# Patient Record
Sex: Female | Born: 1960 | Race: White | Hispanic: No | Marital: Married | Smoking: Former smoker
Health system: Southern US, Community
[De-identification: ages and names within clinical notes are randomized; demographics above are authoritative.]

## PROBLEM LIST (undated history)

## (undated) DIAGNOSIS — K219 Gastro-esophageal reflux disease without esophagitis: Secondary | ICD-10-CM

## (undated) DIAGNOSIS — M1990 Unspecified osteoarthritis, unspecified site: Secondary | ICD-10-CM

## (undated) DIAGNOSIS — T7840XA Allergy, unspecified, initial encounter: Secondary | ICD-10-CM

## (undated) DIAGNOSIS — J45909 Unspecified asthma, uncomplicated: Secondary | ICD-10-CM

## (undated) DIAGNOSIS — D649 Anemia, unspecified: Secondary | ICD-10-CM

## (undated) DIAGNOSIS — F17211 Nicotine dependence, cigarettes, in remission: Secondary | ICD-10-CM

## (undated) DIAGNOSIS — N952 Postmenopausal atrophic vaginitis: Secondary | ICD-10-CM

## (undated) DIAGNOSIS — Z6839 Body mass index (BMI) 39.0-39.9, adult: Secondary | ICD-10-CM

## (undated) DIAGNOSIS — E785 Hyperlipidemia, unspecified: Principal | ICD-10-CM

## (undated) DIAGNOSIS — R7303 Prediabetes: Secondary | ICD-10-CM

## (undated) DIAGNOSIS — Z6841 Body Mass Index (BMI) 40.0 and over, adult: Principal | ICD-10-CM

## (undated) DIAGNOSIS — E66812 Obesity, class 2: Secondary | ICD-10-CM

## (undated) DIAGNOSIS — R053 Chronic cough: Secondary | ICD-10-CM

## (undated) HISTORY — PX: POLYPECTOMY: SHX149

## (undated) HISTORY — DX: Allergy, unspecified, initial encounter: T78.40XA

## (undated) HISTORY — DX: Unspecified asthma, uncomplicated: J45.909

## (undated) HISTORY — DX: Unspecified osteoarthritis, unspecified site: M19.90

## (undated) HISTORY — DX: Anemia, unspecified: D64.9

## (undated) HISTORY — DX: Gastro-esophageal reflux disease without esophagitis: K21.9

---
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
## 1983-10-06 HISTORY — PX: TUBAL LIGATION: SHX77

## 2001-06-19 ENCOUNTER — Encounter: Payer: Self-pay | Admitting: Emergency Medicine

## 2001-06-19 ENCOUNTER — Emergency Department (HOSPITAL_COMMUNITY): Admission: EM | Admit: 2001-06-19 | Discharge: 2001-06-19 | Payer: Self-pay | Admitting: Emergency Medicine

## 2002-08-03 ENCOUNTER — Encounter: Admission: RE | Admit: 2002-08-03 | Discharge: 2002-08-03 | Payer: Self-pay | Admitting: Family Medicine

## 2002-08-03 ENCOUNTER — Encounter: Payer: Self-pay | Admitting: Family Medicine

## 2003-11-02 ENCOUNTER — Ambulatory Visit (HOSPITAL_COMMUNITY): Admission: RE | Admit: 2003-11-02 | Discharge: 2003-11-02 | Payer: Self-pay | Admitting: Family Medicine

## 2009-10-05 HISTORY — PX: LIPOMA EXCISION: SHX5283

## 2013-01-14 ENCOUNTER — Ambulatory Visit (INDEPENDENT_AMBULATORY_CARE_PROVIDER_SITE_OTHER): Payer: BC Managed Care – PPO | Admitting: Family Medicine

## 2013-01-14 VITALS — BP 125/76 | HR 77 | Temp 98.5°F | Resp 16 | Ht 66.0 in | Wt 199.0 lb

## 2013-01-14 DIAGNOSIS — J45901 Unspecified asthma with (acute) exacerbation: Secondary | ICD-10-CM

## 2013-01-14 MED ORDER — ALBUTEROL SULFATE HFA 108 (90 BASE) MCG/ACT IN AERS: 2.0000 | INHALATION_SPRAY | Freq: Four times a day (QID) | RESPIRATORY_TRACT | Status: DC | PRN

## 2013-01-14 MED ORDER — MONTELUKAST SODIUM 10 MG PO TABS: 10.0000 mg | ORAL_TABLET | Freq: Every day | ORAL | Status: DC

## 2013-01-14 MED ORDER — BUDESONIDE-FORMOTEROL FUMARATE 160-4.5 MCG/ACT IN AERO: 2.0000 | INHALATION_SPRAY | Freq: Two times a day (BID) | RESPIRATORY_TRACT | Status: DC

## 2013-01-14 NOTE — Progress Notes (Signed)
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  She is here to get her asthma meds refilled.  She is almost out and cannot get appt with PCP.  Objective:  NAD HEENT unremarkable Chest:  Good BS, very few wheezes Heart: reg, no murmur Skin:  Warm and dry.

## 2013-01-14 NOTE — Patient Instructions (Signed)

## 2013-04-20 ENCOUNTER — Ambulatory Visit (INDEPENDENT_AMBULATORY_CARE_PROVIDER_SITE_OTHER): Payer: BC Managed Care – PPO | Admitting: Emergency Medicine

## 2013-04-20 VITALS — BP 124/82 | HR 80 | Temp 98.1°F | Resp 16 | Ht 66.0 in | Wt 200.0 lb

## 2013-04-20 DIAGNOSIS — Z0000 Encounter for general adult medical examination without abnormal findings: Secondary | ICD-10-CM

## 2013-04-20 LAB — LIPID PANEL
HDL: 59 mg/dL (ref >39)
Total CHOL/HDL Ratio: 3.5 Ratio
VLDL: 29 mg/dL (ref 0–40)

## 2013-04-20 LAB — COMPREHENSIVE METABOLIC PANEL
ALT: 17 U/L (ref 0–35)
AST: 14 U/L (ref 0–37)
BUN: 10 mg/dL (ref 6–23)
Creat: 0.76 mg/dL (ref 0.50–1.10)
Total Bilirubin: 0.6 mg/dL (ref 0.3–1.2)

## 2013-04-20 NOTE — Progress Notes (Signed)
[facility] [address], [place] [address] [phone]  Date:  04/20/2013   Name:  Tricia Bowen   DOB:  [birth year]   MRN:  [id number]  PCP:  No PCP Per Patient    Chief Complaint: paperwork and bloodwork   History of Present Illness:  Tricia Bowen is a 52 y.o. very pleasant female patient who presents with the following:  Comes for wellness examination.  meds for asthma and allergies.  Denies other complaint or health concern today.   There are no active problems to display for this patient.   Past Medical History  Diagnosis Date  . Allergy   . Anemia   . Asthma     Past Surgical History  Procedure Laterality Date  . Tubal ligation      History  Substance Use Topics  . Smoking status: Former Smoker    Types: Cigarettes    Quit date: 06/05/2008  . Smokeless tobacco: Not on file  . Alcohol Use: Yes     Comment: 7/wk    Family History  Problem Relation Age of Onset  . Alzheimer's disease Mother   . Heart disease Father   . Heart disease Maternal Grandmother   . Heart disease Maternal Grandfather   . Alzheimer's disease Paternal Grandmother   . Heart disease Paternal Grandfather     Allergies  Allergen Reactions  . Penicillins     Medication list has been reviewed and updated.  Current Outpatient Prescriptions on File Prior to Visit  Medication Sig Dispense Refill  . albuterol (PROVENTIL HFA;VENTOLIN HFA) 108 (90 BASE) MCG/ACT inhaler Inhale 2 puffs into the lungs every 6 (six) hours as needed for wheezing.  6.7 g  11  . budesonide-formoterol (SYMBICORT) 160-4.5 MCG/ACT inhaler Inhale 2 puffs into the lungs 2 (two) times daily.  1 Inhaler  11  . fexofenadine (ALLEGRA) 180 MG tablet Take 180 mg by mouth daily.      . montelukast (SINGULAIR) 10 MG tablet Take 1 tablet (10 mg total) by mouth at bedtime.  90 tablet  3   No current facility-administered medications on file prior to visit.    Review of Systems:  As per HPI,  otherwise negative.    Physical Examination: Filed Vitals:   04/20/13 0818  BP: 124/82  Pulse: 80  Temp: 98.1 F (36.7 C)  Resp: 16   Filed Vitals:   04/20/13 0818  Height: 5\' 6"  (1.676 m)  Weight: 200 lb (90.719 kg)   Body mass index is 32.3 kg/(m^2). Ideal Body Weight: Weight in (lb) to have BMI = 25: 154.6  GEN: WDWN, NAD, Non-toxic, A & O x 3 HEENT: Atraumatic, Normocephalic. Neck supple. No masses, No LAD. Ears and Nose: No external deformity. CV: RRR, No M/G/R. No JVD. No thrill. No extra heart sounds. PULM: CTA B, no wheezes, crackles, rhonchi. No retractions. No resp. distress. No accessory muscle use. ABD: S, NT, ND, +BS. No rebound. No HSM. EXTR: No c/c/e NEURO Normal gait.  PSYCH: Normally interactive. Conversant. Not depressed or anxious appearing.  Calm demeanor.     Assessment and Plan: Wellness examination Labs pending.   Signed,  Phillips Odor, MD

## 2013-07-15 ENCOUNTER — Ambulatory Visit: Payer: BC Managed Care – PPO

## 2013-07-15 ENCOUNTER — Ambulatory Visit (INDEPENDENT_AMBULATORY_CARE_PROVIDER_SITE_OTHER): Payer: BC Managed Care – PPO | Admitting: Family Medicine

## 2013-07-15 VITALS — BP 124/72 | HR 112 | Temp 101.3°F | Resp 18 | Ht 65.75 in | Wt 205.2 lb

## 2013-07-15 DIAGNOSIS — R05 Cough: Secondary | ICD-10-CM

## 2013-07-15 DIAGNOSIS — R52 Pain, unspecified: Secondary | ICD-10-CM

## 2013-07-15 DIAGNOSIS — R809 Proteinuria, unspecified: Secondary | ICD-10-CM

## 2013-07-15 DIAGNOSIS — R319 Hematuria, unspecified: Secondary | ICD-10-CM

## 2013-07-15 DIAGNOSIS — R822 Biliuria: Secondary | ICD-10-CM

## 2013-07-15 DIAGNOSIS — R509 Fever, unspecified: Secondary | ICD-10-CM

## 2013-07-15 LAB — POCT INFLUENZA A/B: Influenza B, POC: NEGATIVE

## 2013-07-15 LAB — POCT URINALYSIS DIPSTICK
Ketones, UA: 40
Leukocytes, UA: NEGATIVE
Nitrite, UA: NEGATIVE
Protein, UA: >=300
pH, UA: 6

## 2013-07-15 LAB — POCT CBC
Granulocyte percent: 80.6 %G — AB (ref 37–80)
HCT, POC: 39.9 % (ref 37.7–47.9)
MCH, POC: 32 pg — AB (ref 27–31.2)
MCV: 100.4 fL — AB (ref 80–97)
MID (cbc): 0.7 (ref 0–0.9)
RBC: 3.97 M/uL — AB (ref 4.04–5.48)
WBC: 9.9 10*3/uL (ref 4.6–10.2)

## 2013-07-15 LAB — POCT UA - MICROSCOPIC ONLY
Crystals, Ur, HPF, POC: NEGATIVE
Yeast, UA: NEGATIVE

## 2013-07-15 MED ORDER — LEVOFLOXACIN 500 MG PO TABS: 500.0000 mg | ORAL_TABLET | Freq: Every day | ORAL | Status: DC

## 2013-07-15 NOTE — Progress Notes (Signed)
Subjective:    Patient ID: Tricia Bowen, female    DOB: 12-Apr-1961, 52 y.o.   MRN: [id number]  HPI Tricia Bowen is a 52 y.o. female  Started 3 mornings ago.  Went into work -vomited small amount of tea - mouth full. bodyaches that day.  Persistent bodyaches, subjective f/c.  then cough last night. Hx of asthma - uses albuterol only past few days  - few times per day as out of symbicort, but refill at pharmacy. Hurts to cough in chest, and hurts with deep breath. No recent wheeze. walking everyday for exercise - calves sore with this, no calf swelling.   Noted pink urine today, no blood in urine prior to today. No dysuria/frequency or urgency. Slight upper stomach soreness, no other n/v.  Going through menopause, but no recent vaginal bleeding.   Sick contact with cough at work last week.  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   Tx: ibuprofen for myalgias and fevers.   Past Medical History  Diagnosis Date  . Allergy   . Anemia   . Asthma    Past Surgical History  Procedure Laterality Date  . Tubal ligation     Allergies  Allergen Reactions  . Penicillins    Prior to Admission medications   Medication Sig Start Date End Date Taking? Authorizing Provider  albuterol (PROVENTIL HFA;VENTOLIN HFA) 108 (90 BASE) MCG/ACT inhaler Inhale 2 puffs into the lungs every 6 (six) hours as needed for wheezing. 01/14/13  Yes Elvina Sidle, MD  budesonide-formoterol [facility]) 160-4.5 MCG/ACT inhaler Inhale 2 puffs into the lungs 2 (two) times daily. 01/14/13  Yes Elvina Sidle, MD  fexofenadine (ALLEGRA) 180 MG tablet Take 180 mg by mouth daily.   Yes Historical Provider, MD  montelukast (SINGULAIR) 10 MG tablet Take 1 tablet (10 mg total) by mouth at bedtime. 01/14/13  Yes Elvina Sidle, MD   History   Social History  . Marital Status: Married    Spouse Name: N/A    Number of Children: N/A  . Years of Education: N/A   Occupational History  . Not on file.    Social History Main Topics  . Smoking status: Former Smoker    Types: Cigarettes    Quit date: 06/05/2008  . Smokeless tobacco: Not on file  . Alcohol Use: Yes     Comment: 7/wk  . Drug Use: No  . Sexual Activity: Yes    Birth Control/ Protection: None   Other Topics Concern  . Not on file   Social History Narrative  . No narrative on file      Review of Systems  Constitutional: Positive for fever and chills.  HENT: Positive for sneezing (with allergies, but hurts in head to sneeze. ).   Respiratory: Positive for cough. Negative for shortness of breath (hurts to take a deep breath since last night. ).   Genitourinary: Positive for hematuria (pink toilet bowl. ). Negative for dysuria, urgency, frequency and vaginal bleeding.       Objective:   Physical Exam  Vitals reviewed. Constitutional: She is oriented to person, place, and time. She appears well-developed and well-nourished. No distress.  HENT:  Head: Normocephalic and atraumatic.  Right Ear: Hearing, tympanic membrane, external ear and ear canal normal.  Left Ear: Hearing, tympanic membrane, external ear and ear canal normal.  Nose: Nose normal.  Mouth/Throat: Oropharynx is clear and moist. No oropharyngeal exudate.  Eyes: Conjunctivae and EOM are normal. Pupils are equal, round,  and reactive to light.  Cardiovascular: Normal rate, regular rhythm, normal heart sounds and intact distal pulses.   No murmur heard. Pulmonary/Chest: Effort normal and breath sounds normal. No respiratory distress. She has no decreased breath sounds. She has no wheezes. She has no rhonchi. She has no rales.  Distant but clear, and no distress. No apparent wheeze.   Abdominal: Soft. Normal appearance and bowel sounds are normal. There is tenderness (minimal LUQ. ) in the left upper quadrant. There is no CVA tenderness.  Musculoskeletal:       Right lower leg: She exhibits no swelling and no edema (negative homan's bilaterally. ).        Left lower leg: She exhibits no swelling and no edema.  Neurological: She is alert and oriented to person, place, and time.  Skin: Skin is warm and dry. No rash noted.  Psychiatric: She has a normal mood and affect. Her behavior is normal.   Filed Vitals:   07/15/13 1655  BP: 124/72  Pulse: 112  Temp: 101.3 F (38.5 C)  TempSrc: Oral  Resp: 18  Height: 5' 5.75" (1.67 m)  Weight: 205 lb 3.2 oz (93.078 kg)  SpO2: 93%   Results for orders placed in visit on 07/15/13  POCT UA - MICROSCOPIC ONLY      Result Value Range   WBC, Ur, HPF, POC 3-5     RBC, urine, microscopic 5-7     Bacteria, U Microscopic 1+     Mucus, UA pos     Epithelial cells, urine per micros 10-15 with clumps of TNTC     Crystals, Ur, HPF, POC neg     Casts, Ur, LPF, POC neg     Yeast, UA neg    POCT URINALYSIS DIPSTICK      Result Value Range   Color, UA dark orange     Clarity, UA turbid     Glucose, UA neg     Bilirubin, UA moderate     Ketones, UA 40     Spec Grav, UA >=1.030     Blood, UA karge     pH, UA 6.0     Protein, UA >=300     Urobilinogen, UA 1.0     Nitrite, UA neg     Leukocytes, UA Negative    POCT CBC      Result Value Range   WBC 9.9  4.6 - 10.2 K/uL   Lymph, poc 1.2  0.6 - 3.4   POC LYMPH PERCENT 12.2  10 - 50 %L   MID (cbc) 0.7  0 - 0.9   POC MID % 7.2  0 - 12 %M   POC Granulocyte 8.0 (*) 2 - 6.9   Granulocyte percent 80.6 (*) 37 - 80 %G   RBC 3.97 (*) 4.04 - 5.48 M/uL   Hemoglobin 12.7  12.2 - 16.2 g/dL   HCT, POC 16.1  09.6 - 47.9 %   MCV 100.4 (*) 80 - 97 fL   MCH, POC 32.0 (*) 27 - 31.2 pg   MCHC 31.8  31.8 - 35.4 g/dL   RDW, POC 04.5     Platelet Count, POC 269  142 - 424 K/uL   MPV 8.9  0 - 99.8 fL  POCT INFLUENZA A/B      Result Value Range   Influenza A, POC Negative     Influenza B, POC Negative      [facility] reading (PRIMARY) by  Dr. Neva Seat: CXR: RML infiltrate.  Assessment & Plan:  Tricia Bowen is a 52 y.o. female  Discolored urine - with few RBC/WBC,  but does not appear to be clean catch, no urinary sx's at present otherwise. Proteinuria and bilirubinuria noted.Plan: Comprehensive metabolic panel, urine cx.  Will be on Levaquin as below.   RML pneumonia, cough, fever. levofloxacin (LEVAQUIN) 500 MG tablet started. PCN allergic.  Repeat O2 sat 95%.  Underlying asthma, but no current wheeze or apparent flair. DDX. includes aspiration pneumonia with prior small amt of emesis with drinking tea few days prior.  Close follow up with recheck in next 48 hours, and ER precautions discussed. Scheduled to fly in a few days, discussed concerns with PNA and 02 satm but can recheck this in 2 days at follow up.   Meds ordered this encounter  Medications  . levofloxacin (LEVAQUIN) 500 MG tablet    Sig: Take 1 tablet (500 mg total) by mouth daily.    Dispense:  10 tablet    Refill:  0   Patient Instructions  Start levaquin for pneumonia. This will also cover urinary infections, but will check urine culture to make sure. You should receive a call or letter about your lab results within the next week to 10 days.  Recheck with Dr. Neva Seat Monday afternoon at [address] - appointment building. We are trying to get you scheduled for around 4:00.  Return to the clinic or go to the nearest emergency room if any of your symptoms worsen or new symptoms occur. Pneumonia, Adult Pneumonia is an infection of the lungs.  CAUSES Pneumonia may be caused by bacteria or a virus. Usually, these infections are caused by breathing infectious particles into the lungs (respiratory tract). SYMPTOMS   Cough.  Fever.  Chest pain.  Increased rate of breathing.  Wheezing.  Mucus production. DIAGNOSIS  If you have the common symptoms of pneumonia, your caregiver will typically confirm the diagnosis with a chest X-ray. The X-ray will show an abnormality in the lung (pulmonary infiltrate) if you have pneumonia. Other tests of your blood, urine, or sputum may be done to  find the specific cause of your pneumonia. Your caregiver may also do tests (blood gases or pulse oximetry) to see how well your lungs are working. TREATMENT  Some forms of pneumonia may be spread to other people when you cough or sneeze. You may be asked to wear a mask before and during your exam. Pneumonia that is caused by bacteria is treated with antibiotic medicine. Pneumonia that is caused by the influenza virus may be treated with an antiviral medicine. Most other viral infections must run their course. These infections will not respond to antibiotics.  PREVENTION A pneumococcal shot (vaccine) is available to prevent a common bacterial cause of pneumonia. This is usually suggested for:  People over 19 years old.  Patients on chemotherapy.  People with chronic lung problems, such as bronchitis or emphysema.  People with immune system problems. If you are over 65 or have a high risk condition, you may receive the pneumococcal vaccine if you have not received it before. In some countries, a routine influenza vaccine is also recommended. This vaccine can help prevent some cases of pneumonia.You may be offered the influenza vaccine as part of your care. If you smoke, it is time to quit. You may receive instructions on how to stop smoking. Your caregiver can provide medicines and counseling to help you quit. HOME CARE INSTRUCTIONS   Cough suppressants may be used if  you are losing too much rest. However, coughing protects you by clearing your lungs. You should avoid using cough suppressants if you can.  Your caregiver may have prescribed medicine if he or she thinks your pneumonia is caused by a bacteria or influenza. Finish your medicine even if you start to feel better.  Your caregiver may also prescribe an expectorant. This loosens the mucus to be coughed up.  Only take over-the-counter or prescription medicines for pain, discomfort, or fever as directed by your caregiver.  Do not  smoke. Smoking is a common cause of bronchitis and can contribute to pneumonia. If you are a smoker and continue to smoke, your cough may last several weeks after your pneumonia has cleared.  A cold steam vaporizer or humidifier in your room or home may help loosen mucus.  Coughing is often worse at night. Sleeping in a semi-upright position in a recliner or using a couple pillows under your head will help with this.  Get rest as you feel it is needed. Your body will usually let you know when you need to rest. SEEK IMMEDIATE MEDICAL CARE IF:   Your illness becomes worse. This is especially true if you are elderly or weakened from any other disease.  You cannot control your cough with suppressants and are losing sleep.  You begin coughing up blood.  You develop pain which is getting worse or is uncontrolled with medicines.  You have a fever.  Any of the symptoms which initially brought you in for treatment are getting worse rather than better.  You develop shortness of breath or chest pain. MAKE SURE YOU:   Understand these instructions.  Will watch your condition.  Will get help right away if you are not doing well or get worse. Document Released: 09/21/2005 Document Revised: 12/14/2011 Document Reviewed: 12/11/2010 [facility] Patient Information 2014 [place], [place]. Fever  Fever is a higher-than-normal body temperature. A normal temperature varies with:  Age.  How it is measured (mouth, underarm, rectal, or ear).  Time of day. In an adult, an oral temperature around 98.6 Fahrenheit (F) or 37 Celsius (C) is considered normal. A rise in temperature of about 1.8 F or 1 C is generally considered a fever (100.4 F or 38 C). In an infant age 55 days or less, a rectal temperature of 100.4 F (38 C) generally is regarded as fever. Fever is not a disease but can be a symptom of illness. CAUSES   Fever is most commonly caused by infection.  Some non-infectious problems can  cause fever. For example:  Some arthritis problems.  Problems with the thyroid or adrenal glands.  Immune system problems.  Some kinds of cancer.  A reaction to certain medicines.  Occasionally, the source of a fever cannot be determined. This is sometimes called a "Fever of Unknown Origin" (FUO).  Some situations may lead to a temporary rise in body temperature that may go away on its own. Examples are:  Childbirth.  Surgery.  Some situations may cause a rise in body temperature but these are not considered "true fever". Examples are:  Intense exercise.  Dehydration.  Exposure to high outside or room temperatures. SYMPTOMS   Feeling warm or hot.  Fatigue or feeling exhausted.  Aching all over.  Chills.  Shivering.  Sweats. DIAGNOSIS  A fever can be suspected by your caregiver feeling that your skin is unusually warm. The fever is confirmed by taking a temperature with a thermometer. Temperatures can be taken different ways. Some  methods are accurate and some are not: With adults, adolescents, and children:   An oral temperature is used most commonly.  An ear thermometer will only be accurate if it is positioned as recommended by the manufacturer.  Under the arm temperatures are not accurate and not recommended.  Most electronic thermometers are fast and accurate. Infants and Toddlers:  Rectal temperatures are recommended and most accurate.  Ear temperatures are not accurate in this age group and are not recommended.  Skin thermometers are not accurate. RISKS AND COMPLICATIONS   During a fever, the body uses more oxygen, so a person with a fever may develop rapid breathing or shortness of breath. This can be dangerous especially in people with heart or lung disease.  The sweats that occur following a fever can cause dehydration.  High fever can cause seizures in infants and children.  Older persons can develop confusion during a fever. TREATMENT    Medications may be used to control temperature.  Do not give aspirin to children with fevers. There is an association with Reye's syndrome. Reye's syndrome is a rare but potentially deadly disease.  If an infection is present and medications have been prescribed, take them as directed. Finish the full course of medications until they are gone.  Sponging or bathing with room-temperature water may help reduce body temperature. Do not use ice water or alcohol sponge baths.  Do not over-bundle children in blankets or heavy clothes.  Drinking adequate fluids during an illness with fever is important to prevent dehydration. HOME CARE INSTRUCTIONS   For adults, rest and adequate fluid intake are important. Dress according to how you feel, but do not over-bundle.  Drink enough water and/or fluids to keep your urine clear or pale yellow.  For infants over 3 months and children, giving medication as directed by your caregiver to control fever can help with comfort. The amount to be given is based on the child's weight. Do NOT give more than is recommended. SEEK MEDICAL CARE IF:   You or your child are unable to keep fluids down.  Vomiting or diarrhea develops.  You develop a skin rash.  An oral temperature above 102 F (38.9 C) develops, or a fever which persists for over 3 days.  You develop excessive weakness, dizziness, fainting or extreme thirst.  Fevers keep coming back after 3 days. SEEK IMMEDIATE MEDICAL CARE IF:   Shortness of breath or trouble breathing develops  You pass out.  You feel you are making little or no urine.  New pain develops that was not there before (such as in the head, neck, chest, back, or abdomen).  You cannot hold down fluids.  Vomiting and diarrhea persist for more than a day or two.  You develop a stiff neck and/or your eyes become sensitive to light.  An unexplained temperature above 102 F (38.9 C) develops. Document Released: 09/21/2005  Document Revised: 12/14/2011 Document Reviewed: 09/06/2008 [facility] Patient Information 2014 [place], [place].

## 2013-07-15 NOTE — Patient Instructions (Signed)
Start levaquin for pneumonia. This will also cover urinary infections, but will check urine culture to make sure. You should receive a call or letter about your lab results within the next week to 10 days.  Recheck with Dr. Neva Seat Monday afternoon at [address] - appointment building. We are trying to get you scheduled for around 4:00.  Return to the clinic or go to the nearest emergency room if any of your symptoms worsen or new symptoms occur. Pneumonia, Adult Pneumonia is an infection of the lungs.  CAUSES Pneumonia may be caused by bacteria or a virus. Usually, these infections are caused by breathing infectious particles into the lungs (respiratory tract). SYMPTOMS   Cough.  Fever.  Chest pain.  Increased rate of breathing.  Wheezing.  Mucus production. DIAGNOSIS  If you have the common symptoms of pneumonia, your caregiver will typically confirm the diagnosis with a chest X-ray. The X-ray will show an abnormality in the lung (pulmonary infiltrate) if you have pneumonia. Other tests of your blood, urine, or sputum may be done to find the specific cause of your pneumonia. Your caregiver may also do tests (blood gases or pulse oximetry) to see how well your lungs are working. TREATMENT  Some forms of pneumonia may be spread to other people when you cough or sneeze. You may be asked to wear a mask before and during your exam. Pneumonia that is caused by bacteria is treated with antibiotic medicine. Pneumonia that is caused by the influenza virus may be treated with an antiviral medicine. Most other viral infections must run their course. These infections will not respond to antibiotics.  PREVENTION A pneumococcal shot (vaccine) is available to prevent a common bacterial cause of pneumonia. This is usually suggested for:  People over 75 years old.  Patients on chemotherapy.  People with chronic lung problems, such as bronchitis or emphysema.  People with immune system  problems. If you are over 65 or have a high risk condition, you may receive the pneumococcal vaccine if you have not received it before. In some countries, a routine influenza vaccine is also recommended. This vaccine can help prevent some cases of pneumonia.You may be offered the influenza vaccine as part of your care. If you smoke, it is time to quit. You may receive instructions on how to stop smoking. Your caregiver can provide medicines and counseling to help you quit. HOME CARE INSTRUCTIONS   Cough suppressants may be used if you are losing too much rest. However, coughing protects you by clearing your lungs. You should avoid using cough suppressants if you can.  Your caregiver may have prescribed medicine if he or she thinks your pneumonia is caused by a bacteria or influenza. Finish your medicine even if you start to feel better.  Your caregiver may also prescribe an expectorant. This loosens the mucus to be coughed up.  Only take over-the-counter or prescription medicines for pain, discomfort, or fever as directed by your caregiver.  Do not smoke. Smoking is a common cause of bronchitis and can contribute to pneumonia. If you are a smoker and continue to smoke, your cough may last several weeks after your pneumonia has cleared.  A cold steam vaporizer or humidifier in your room or home may help loosen mucus.  Coughing is often worse at night. Sleeping in a semi-upright position in a recliner or using a couple pillows under your head will help with this.  Get rest as you feel it is needed. Your body will  usually let you know when you need to rest. SEEK IMMEDIATE MEDICAL CARE IF:   Your illness becomes worse. This is especially true if you are elderly or weakened from any other disease.  You cannot control your cough with suppressants and are losing sleep.  You begin coughing up blood.  You develop pain which is getting worse or is uncontrolled with medicines.  You have a  fever.  Any of the symptoms which initially brought you in for treatment are getting worse rather than better.  You develop shortness of breath or chest pain. MAKE SURE YOU:   Understand these instructions.  Will watch your condition.  Will get help right away if you are not doing well or get worse. Document Released: 09/21/2005 Document Revised: 12/14/2011 Document Reviewed: 12/11/2010 [facility] Patient Information 2014 [place], [place]. Fever  Fever is a higher-than-normal body temperature. A normal temperature varies with:  Age.  How it is measured (mouth, underarm, rectal, or ear).  Time of day. In an adult, an oral temperature around 98.6 Fahrenheit (F) or 37 Celsius (C) is considered normal. A rise in temperature of about 1.8 F or 1 C is generally considered a fever (100.4 F or 38 C). In an infant age 48 days or less, a rectal temperature of 100.4 F (38 C) generally is regarded as fever. Fever is not a disease but can be a symptom of illness. CAUSES   Fever is most commonly caused by infection.  Some non-infectious problems can cause fever. For example:  Some arthritis problems.  Problems with the thyroid or adrenal glands.  Immune system problems.  Some kinds of cancer.  A reaction to certain medicines.  Occasionally, the source of a fever cannot be determined. This is sometimes called a "Fever of Unknown Origin" (FUO).  Some situations may lead to a temporary rise in body temperature that may go away on its own. Examples are:  Childbirth.  Surgery.  Some situations may cause a rise in body temperature but these are not considered "true fever". Examples are:  Intense exercise.  Dehydration.  Exposure to high outside or room temperatures. SYMPTOMS   Feeling warm or hot.  Fatigue or feeling exhausted.  Aching all over.  Chills.  Shivering.  Sweats. DIAGNOSIS  A fever can be suspected by your caregiver feeling that your skin is unusually  warm. The fever is confirmed by taking a temperature with a thermometer. Temperatures can be taken different ways. Some methods are accurate and some are not: With adults, adolescents, and children:   An oral temperature is used most commonly.  An ear thermometer will only be accurate if it is positioned as recommended by the manufacturer.  Under the arm temperatures are not accurate and not recommended.  Most electronic thermometers are fast and accurate. Infants and Toddlers:  Rectal temperatures are recommended and most accurate.  Ear temperatures are not accurate in this age group and are not recommended.  Skin thermometers are not accurate. RISKS AND COMPLICATIONS   During a fever, the body uses more oxygen, so a person with a fever may develop rapid breathing or shortness of breath. This can be dangerous especially in people with heart or lung disease.  The sweats that occur following a fever can cause dehydration.  High fever can cause seizures in infants and children.  Older persons can develop confusion during a fever. TREATMENT   Medications may be used to control temperature.  Do not give aspirin to children with fevers. There is  an association with Reye's syndrome. Reye's syndrome is a rare but potentially deadly disease.  If an infection is present and medications have been prescribed, take them as directed. Finish the full course of medications until they are gone.  Sponging or bathing with room-temperature water may help reduce body temperature. Do not use ice water or alcohol sponge baths.  Do not over-bundle children in blankets or heavy clothes.  Drinking adequate fluids during an illness with fever is important to prevent dehydration. HOME CARE INSTRUCTIONS   For adults, rest and adequate fluid intake are important. Dress according to how you feel, but do not over-bundle.  Drink enough water and/or fluids to keep your urine clear or pale yellow.  For  infants over 3 months and children, giving medication as directed by your caregiver to control fever can help with comfort. The amount to be given is based on the child's weight. Do NOT give more than is recommended. SEEK MEDICAL CARE IF:   You or your child are unable to keep fluids down.  Vomiting or diarrhea develops.  You develop a skin rash.  An oral temperature above 102 F (38.9 C) develops, or a fever which persists for over 3 days.  You develop excessive weakness, dizziness, fainting or extreme thirst.  Fevers keep coming back after 3 days. SEEK IMMEDIATE MEDICAL CARE IF:   Shortness of breath or trouble breathing develops  You pass out.  You feel you are making little or no urine.  New pain develops that was not there before (such as in the head, neck, chest, back, or abdomen).  You cannot hold down fluids.  Vomiting and diarrhea persist for more than a day or two.  You develop a stiff neck and/or your eyes become sensitive to light.  An unexplained temperature above 102 F (38.9 C) develops. Document Released: 09/21/2005 Document Revised: 12/14/2011 Document Reviewed: 09/06/2008 [facility] Patient Information 2014 [place], [place].

## 2013-07-16 LAB — COMPREHENSIVE METABOLIC PANEL
ALT: 18 U/L (ref 0–35)
AST: 17 U/L (ref 0–37)
Albumin: 3.8 g/dL (ref 3.5–5.2)
Alkaline Phosphatase: 74 U/L (ref 39–117)
BUN: 10 mg/dL (ref 6–23)
Calcium: 8.4 mg/dL (ref 8.4–10.5)
Chloride: 101 mEq/L (ref 96–112)
Potassium: 4 mEq/L (ref 3.5–5.3)
Sodium: 133 mEq/L — ABNORMAL LOW (ref 135–145)
Total Protein: 6.6 g/dL (ref 6.0–8.3)

## 2013-07-17 ENCOUNTER — Encounter: Payer: Self-pay | Admitting: Family Medicine

## 2013-07-17 ENCOUNTER — Ambulatory Visit (INDEPENDENT_AMBULATORY_CARE_PROVIDER_SITE_OTHER): Payer: BC Managed Care – PPO | Admitting: Family Medicine

## 2013-07-17 VITALS — BP 130/84 | HR 80 | Temp 98.2°F | Resp 16 | Ht 65.5 in | Wt 204.6 lb

## 2013-07-17 DIAGNOSIS — R0600 Dyspnea, unspecified: Secondary | ICD-10-CM

## 2013-07-17 DIAGNOSIS — R319 Hematuria, unspecified: Secondary | ICD-10-CM

## 2013-07-17 DIAGNOSIS — J45909 Unspecified asthma, uncomplicated: Secondary | ICD-10-CM

## 2013-07-17 DIAGNOSIS — R0609 Other forms of dyspnea: Secondary | ICD-10-CM

## 2013-07-17 DIAGNOSIS — J189 Pneumonia, unspecified organism: Secondary | ICD-10-CM

## 2013-07-17 DIAGNOSIS — E871 Hypo-osmolality and hyponatremia: Secondary | ICD-10-CM

## 2013-07-17 LAB — POCT URINALYSIS DIPSTICK
Bilirubin, UA: NEGATIVE
Glucose, UA: NEGATIVE
Nitrite, UA: NEGATIVE
Spec Grav, UA: 1.015
Urobilinogen, UA: 0.2

## 2013-07-17 LAB — POCT CBC
Hemoglobin: 12.2 g/dL (ref 12.2–16.2)
Lymph, poc: 1.5 (ref 0.6–3.4)
MCH, POC: 31.4 pg — AB (ref 27–31.2)
MCHC: 31 g/dL — AB (ref 31.8–35.4)
MID (cbc): 0.4 (ref 0–0.9)
MPV: 8.4 fL (ref 0–99.8)
POC Granulocyte: 4 (ref 2–6.9)
POC LYMPH PERCENT: 25 %L (ref 10–50)
POC MID %: 7.1 %M (ref 0–12)
Platelet Count, POC: 323 10*3/uL (ref 142–424)
RDW, POC: 13.1 %
WBC: 5.9 10*3/uL (ref 4.6–10.2)

## 2013-07-17 LAB — POCT UA - MICROSCOPIC ONLY
Casts, Ur, LPF, POC: NEGATIVE
Yeast, UA: NEGATIVE

## 2013-07-17 NOTE — Patient Instructions (Addendum)
Your oxygen is stable at rest, but after a few laps - does drop lower.  Ok to fly if not exerting self, and no worsening over night. If any worsening of your symptoms while you are away, go to an emergency room. If worse in flight - advise the attendant. recheck with me in next week to 10 days. Sooner with ER if any worsening. Continue antibiotic as prescribed and inhalers if needed.  Can recheck electrolytes at next visit, and plan on repeat x ray in next 6 weeks to make sure area on lungs resolves.

## 2013-07-17 NOTE — Progress Notes (Signed)
Subjective:    Patient ID: Tricia Bowen, female    DOB: 11/16/1960, 52 y.o.   MRN: [id number]  HPI Tricia Bowen is a 52 y.o. female  See ov 2 days ago. Here for follow up pneumonia.  Started on Levaquin. Also noted to have discolored urine. Borderline pulse ox last ov.  Scheduled to fly out of town tomorrow - for 1 week.   CXR report: IMPRESSION:  Probable pneumonia involving superior segment of right lower lobe.  Followup radiographs are recommended until resolution to rule out  possible underlying neoplasm   Less aches, not having fever.  Tolerating antibiotic. S/p 2 doses of Levaquin. Less short of breath. Coughing more, but feels better.   Discolored urine - with few RBC/WBC prior, but did not appear to be clean catch, no urinary sx's then  Proteinuria and bilirubinuria noted. CMP noted - wnl except borderline Na at 133.  No dysuria. Appears to be regular color now. Urine culture pending.   Has had cramps in calves for years, no recent changes, no calf swelling.    Review of Systems  Constitutional: Negative for fever (not since last ov/last 2 days. ) and chills.  Respiratory: Positive for cough and shortness of breath.        Chest sore only with deep breath in.   Cardiovascular: Negative for leg swelling.  Musculoskeletal: Negative for joint swelling.  Skin: Negative for rash.   As above.     Objective:   Physical Exam  Vitals reviewed.     Results for orders placed in visit on 07/17/13  POCT CBC      Result Value Range   WBC 5.9  4.6 - 10.2 K/uL   Lymph, poc 1.5  0.6 - 3.4   POC LYMPH PERCENT 25.0  10 - 50 %L   MID (cbc) 0.4  0 - 0.9   POC MID % 7.1  0 - 12 %M   POC Granulocyte 4.0  2 - 6.9   Granulocyte percent 67.9  37 - 80 %G   RBC 3.89 (*) 4.04 - 5.48 M/uL   Hemoglobin 12.2  12.2 - 16.2 g/dL   HCT, POC 40.9  81.1 - 47.9 %   MCV 101.0 (*) 80 - 97 fL   MCH, POC 31.4 (*) 27 - 31.2 pg   MCHC 31.0 (*) 31.8 - 35.4 g/dL   RDW, POC 91.4     Platelet Count, POC  323  142 - 424 K/uL   MPV 8.4  0 - 99.8 fL  POCT UA - MICROSCOPIC ONLY      Result Value Range   WBC, Ur, HPF, POC 0-7     RBC, urine, microscopic 1-6     Bacteria, U Microscopic trace     Mucus, UA small     Epithelial cells, urine per micros tntc     Crystals, Ur, HPF, POC neg     Casts, Ur, LPF, POC neg     Yeast, UA neg    POCT URINALYSIS DIPSTICK      Result Value Range   Color, UA yellow     Clarity, UA slightly cloudy     Glucose, UA neg     Bilirubin, UA neg     Ketones, UA neg     Spec Grav, UA 1.015     Blood, UA moderate     pH, UA 6.0     Protein, UA 30     Urobilinogen, UA 0.2  Nitrite, UA neg     Leukocytes, UA Trace    ambulatory pulse ox 96% to begin, at rest 97%, ambulatory dropped to 90% after 2 laps (96% with one lap).      Assessment & Plan:  Tricia Bowen is a 52 y.o. female Pneumonia, organism unspecified - Plan: Check Pulse Oximetry while ambulating, Check Pulse Oximetry while ambulating, POCT CBC, POCT UA - Microscopic Only, POCT urinalysis dipstick.  Improving. Afebrile, tolerating levaquin. O2 sat stabel and somewhat improved at rest from last ov.  Did decrease with persistent ambulation, so caution advised with walking long distances in airport tomorrow (cart assistance recommended), but at rest should only have 3-4 mmHG decrease in o2 sat in pressurized cabin. ER precautions discussed on trip, and not to fly if any worsening overnight.  In flight precautions also given. Recheck next week, and repeat CXR in next 6 weeks.    Hematuria - Plan: POCT CBC, POCT UA - Microscopic Only, POCT urinalysis dipstick - asx. Urine cx pending. Continue Levaquin.  Asthma, chronic - no wheeze on exam. Has inhalers and will bring on plane if needed.   Hyponatremia - borderline - recheck lytes at follow up.   Patient Instructions  Your oxygen is stable at rest, but after a few laps - does drop lower.  Ok to fly if not exerting self, and no worsening over night. If  any worsening of your symptoms while you are away, go to an emergency room. If worse in flight - advise the attendant. recheck with me in next week to 10 days. Sooner with ER if any worsening. Continue antibiotic as prescribed and inhalers if needed.  Can recheck electrolytes at next visit, and plan on repeat x ray in next 6 weeks to make sure area on lungs resolves.

## 2013-07-17 NOTE — Progress Notes (Signed)
Appt made with pt for 3:15 today with Dr. Neva Seat.

## 2013-07-18 LAB — URINE CULTURE: Colony Count: 50000

## 2013-07-26 ENCOUNTER — Ambulatory Visit: Payer: BC Managed Care – PPO

## 2013-07-26 ENCOUNTER — Ambulatory Visit (INDEPENDENT_AMBULATORY_CARE_PROVIDER_SITE_OTHER): Payer: BC Managed Care – PPO | Admitting: Family Medicine

## 2013-07-26 VITALS — BP 120/80 | HR 72 | Temp 98.5°F | Resp 18 | Ht 66.0 in | Wt 206.0 lb

## 2013-07-26 DIAGNOSIS — R319 Hematuria, unspecified: Secondary | ICD-10-CM

## 2013-07-26 DIAGNOSIS — R05 Cough: Secondary | ICD-10-CM

## 2013-07-26 DIAGNOSIS — J189 Pneumonia, unspecified organism: Secondary | ICD-10-CM

## 2013-07-26 DIAGNOSIS — R509 Fever, unspecified: Secondary | ICD-10-CM

## 2013-07-26 DIAGNOSIS — R300 Dysuria: Secondary | ICD-10-CM

## 2013-07-26 DIAGNOSIS — R21 Rash and other nonspecific skin eruption: Secondary | ICD-10-CM

## 2013-07-26 LAB — POCT URINALYSIS DIPSTICK
Bilirubin, UA: NEGATIVE
Nitrite, UA: NEGATIVE
Protein, UA: NEGATIVE
Spec Grav, UA: 1.02
Urobilinogen, UA: 0.2

## 2013-07-26 LAB — POCT UA - MICROSCOPIC ONLY
Crystals, Ur, HPF, POC: NEGATIVE
Yeast, UA: NEGATIVE

## 2013-07-26 MED ORDER — CLOTRIMAZOLE 1 % EX CREA: TOPICAL_CREAM | Freq: Two times a day (BID) | CUTANEOUS | Status: DC

## 2013-07-26 MED ORDER — LEVOFLOXACIN 500 MG PO TABS: 500.0000 mg | ORAL_TABLET | Freq: Every day | ORAL | Status: DC

## 2013-07-26 NOTE — Progress Notes (Addendum)
Subjective:    Patient ID: Tricia Bowen, female    DOB: August 29, 1961, 52 y.o.   MRN: [id number]  This chart was scribed for Meredith Staggers, MD by Greggory Stallion, ED Scribe. This patient's care was started at 7:47 PM. - authored by me, but unable to change in system - Silas Sacramento, MD.   HPI HPI Comments: Tricia Bowen is a 52 y.o. female with  1. Pneumonia: Initial eval 07/15/13. Diagnosed with RLL/RML pneumonia. Started on Levaquin 500 mg each day. Borderline hypoxic with O2 stat at 95% on room air. Recheck on 10/13. Ambulatory pulse ox 96% initially then dropped to 90% after two laps. History of underlying asthma without recent flare. Has inhalers if needed. Pt denies fever and chills. She has more of a productive cough, unsure of color, and nasal congestion. Pt still has mild SOB but no pain with expiration. She denies CP currently and within the last week. Recently flew to [place] and had no SOB on the flights. Pt states she has calf pain and bilateral ankle/feet swelling, but states it is nothing new. Ambulatory pulse ox was 96% today.   2. Hematuria: noted on initial eval on 10/11. Urine culture at 50,000 colonies group B strep. Pt denies hematuria, dysuria, frequency, abdominal pain currently.   3. Insect bite: She noticed an insect bite about 10 days ago. Pt thinks it is a spider bite. She has no similar lesions anywhere else. It does not itch or burn. Pt has put a topical antibiotic on it with no relief. She states it has not gotten better or worse since she noticed it.   There are no active problems to display for this patient.  Past Medical History  Diagnosis Date   Allergy    Anemia    Asthma    Past Surgical History  Procedure Laterality Date   Tubal ligation     Allergies  Allergen Reactions   Penicillins    Prior to Admission medications   Medication Sig Start Date End Date Taking? Authorizing Provider  albuterol (PROVENTIL HFA;VENTOLIN HFA) 108 (90 BASE) MCG/ACT inhaler  Inhale 2 puffs into the lungs every 6 (six) hours as needed for wheezing. 01/14/13  Yes Elvina Sidle, MD  budesonide-formoterol [facility]) 160-4.5 MCG/ACT inhaler Inhale 2 puffs into the lungs 2 (two) times daily. 01/14/13  Yes Elvina Sidle, MD  fexofenadine (ALLEGRA) 180 MG tablet Take 180 mg by mouth daily.   Yes Historical Provider, MD  montelukast (SINGULAIR) 10 MG tablet Take 1 tablet (10 mg total) by mouth at bedtime. 01/14/13  Yes Elvina Sidle, MD   History   Social History   Marital Status: Married    Spouse Name: N/A    Number of Children: N/A   Years of Education: N/A   Occupational History   Not on file.   Social History Main Topics   Smoking status: Former Smoker    Types: Cigarettes    Quit date: 06/05/2008   Smokeless tobacco: Not on file   Alcohol Use: Yes     Comment: 7/wk   Drug Use: No   Sexual Activity: Yes    Birth Control/ Protection: None   Other Topics Concern   Not on file   Social History Narrative   No narrative on file     Review of Systems  Constitutional: Negative for fever and chills.  HENT: Positive for congestion.   Respiratory: Positive for cough and shortness of breath.   Cardiovascular: Negative for chest pain.  Gastrointestinal: Negative  for abdominal pain.  Genitourinary: Negative for dysuria, frequency and hematuria.       Objective:   Physical Exam  Constitutional: She is oriented to person, place, and time. She appears well-developed and well-nourished. No distress.  HENT:  Head: Normocephalic and atraumatic.  Right Ear: Hearing, tympanic membrane, external ear and ear canal normal.  Left Ear: Hearing, tympanic membrane, external ear and ear canal normal.  Nose: Nose normal.  Mouth/Throat: Oropharynx is clear and moist. No oropharyngeal exudate.  Eyes: Conjunctivae and EOM are normal. Pupils are equal, round, and reactive to light.  Cardiovascular: Normal rate, regular rhythm, normal heart sounds and  intact distal pulses.   No murmur heard. Pulmonary/Chest: Effort normal and breath sounds normal. No respiratory distress. She has no wheezes. She has no rhonchi.  Musculoskeletal:       Right lower leg: She exhibits no tenderness and no swelling.       Left lower leg: She exhibits no tenderness and no swelling.  Negative Homans.   Neurological: She is alert and oriented to person, place, and time.  Skin: Skin is warm and dry. No rash noted.  2 cm rounded slightly erythematous lesions with scaling centrally. Slight elevation of borders on right upper buttock.   Psychiatric: She has a normal mood and affect. Her behavior is normal.    Filed Vitals:   07/26/13 1725  BP: 120/80  Pulse: 72  Temp: 98.5 F (36.9 C)  TempSrc: Oral  Resp: 18  Height: 5\' 6"  (1.676 m)  Weight: 206 lb (93.441 kg)  SpO2: 97%   [facility] reading (PRIMARY) by  Dr. Neva Seat: CXR: improving RLL infiltrate.   Results for orders placed in visit on 07/26/13  POCT URINALYSIS DIPSTICK      Result Value Range   Color, UA yellow     Clarity, UA hazy     Glucose, UA neg     Bilirubin, UA neg     Ketones, UA neg     Spec Grav, UA 1.020     Blood, UA trace     pH, UA 6.0     Protein, UA neg     Urobilinogen, UA 0.2     Nitrite, UA neg     Leukocytes, UA Negative    POCT UA - MICROSCOPIC ONLY      Result Value Range   WBC, Ur, HPF, POC 2-4     RBC, urine, microscopic 2-4     Bacteria, U Microscopic trace     Mucus, UA neg     Epithelial cells, urine per micros 2-3     Crystals, Ur, HPF, POC neg     Casts, Ur, LPF, POC neg     Yeast, UA neg    POCT SKIN KOH      Result Value Range   Skin KOH, POC Negative         Assessment & Plan:  Tricia Bowen is a 52 y.o. female  Hematuria - Plan: Urine culture, but s/p Levaquin and asymptomatic. Only trace hematuria. Will be extending Levaquin for 4 days as below.   Rash - possible tinea vs psoriatic. Plan: POCT Skin KOH, clotrimazole (LOTRIMIN) 1 % cream BID trial,  and recheck in 1 week.   Pneumonia - Plan: DG Chest 2 View - appears to improving RLL pneumonia.  Discussed this appears to have been infectious with initial f/c, bodyaches, and aspiration possible.  Improved, afebrile and O2 sat stable. Prior pedal edema - bilaterral, longstanding appears  to be noncontributory to current illness, but discussed CT chest if not continuing to improve or any interval worsening. Can follow up to discuss intermittent pedal edema, but exam reassuring tonight. Will extend Levaquin for 4 more days for full 2 week course (no dose yesterday, but can restart tonight). rtc precautions below, recheck in 1 week.  I personally performed the services described in this documentation, which was scribed in my presence. The recorded information has been reviewed and considered, and addended by me as needed.

## 2013-07-26 NOTE — Patient Instructions (Addendum)
Your chest xray appears improved tonight, and oxygen level is also improved. If not continuing to improve, increased shortness of breath, chest pain, or pain with breathing - return here or emergency room and we can order the cat scan as discussed. Recheck next Tuesday after 3pm. You should receive a call or letter about your lab results within the next week to 10 days.  Return to the clinic or go to the nearest emergency room if any of your symptoms worsen or new symptoms occur. The fungus test was negative tonight, but can try antifungal in case this is ringworm. We can recheck this next week.

## 2013-07-28 LAB — URINE CULTURE
Colony Count: NO GROWTH
Organism ID, Bacteria: NO GROWTH

## 2013-08-10 ENCOUNTER — Other Ambulatory Visit: Payer: Self-pay

## 2013-08-19 ENCOUNTER — Ambulatory Visit (INDEPENDENT_AMBULATORY_CARE_PROVIDER_SITE_OTHER): Payer: BC Managed Care – PPO | Admitting: Family Medicine

## 2013-08-19 ENCOUNTER — Ambulatory Visit (HOSPITAL_COMMUNITY)
Admission: RE | Admit: 2013-08-19 | Discharge: 2013-08-19 | Disposition: A | Discharge status: Home / Self Care | Payer: BC Managed Care – HMO | Source: Ambulatory Visit | Attending: Family Medicine | Admitting: Family Medicine

## 2013-08-19 VITALS — BP 126/76 | HR 82 | Temp 98.4°F | Resp 18 | Ht 66.0 in | Wt 204.8 lb

## 2013-08-19 DIAGNOSIS — R0602 Shortness of breath: Secondary | ICD-10-CM

## 2013-08-19 DIAGNOSIS — M79661 Pain in right lower leg: Secondary | ICD-10-CM

## 2013-08-19 DIAGNOSIS — M79609 Pain in unspecified limb: Secondary | ICD-10-CM | POA: Insufficient documentation

## 2013-08-19 DIAGNOSIS — M25561 Pain in right knee: Secondary | ICD-10-CM

## 2013-08-19 DIAGNOSIS — M25569 Pain in unspecified knee: Secondary | ICD-10-CM

## 2013-08-19 LAB — POCT UA - MICROSCOPIC ONLY
Casts, Ur, LPF, POC: NEGATIVE
Crystals, Ur, HPF, POC: NEGATIVE
Mucus, UA: NEGATIVE
RBC, urine, microscopic: NEGATIVE
WBC, Ur, HPF, POC: NEGATIVE
Yeast, UA: NEGATIVE

## 2013-08-19 LAB — POCT CBC
Granulocyte percent: 60.6 %G (ref 37–80)
HCT, POC: 43.8 % (ref 37.7–47.9)
Hemoglobin: 13.7 g/dL (ref 12.2–16.2)
Lymph, poc: 2 (ref 0.6–3.4)
MCH, POC: 31.4 pg — AB (ref 27–31.2)
MCHC: 31.3 g/dL — AB (ref 31.8–35.4)
MCV: 100.2 fL — AB (ref 80–97)
MID (cbc): 0.4 (ref 0–0.9)
MPV: 9.4 fL (ref 0–99.8)
POC Granulocyte: 3.8 (ref 2–6.9)
POC LYMPH PERCENT: 32.9 %L (ref 10–50)
POC MID %: 6.5 %M (ref 0–12)
Platelet Count, POC: 248 10*3/uL (ref 142–424)
RBC: 4.37 M/uL (ref 4.04–5.48)
RDW, POC: 13.1 %
WBC: 6.2 10*3/uL (ref 4.6–10.2)

## 2013-08-19 LAB — POCT URINALYSIS DIPSTICK
Bilirubin, UA: NEGATIVE
Glucose, UA: NEGATIVE
Ketones, UA: NEGATIVE
Leukocytes, UA: NEGATIVE
Nitrite, UA: NEGATIVE
Protein, UA: NEGATIVE
Spec Grav, UA: <=1.005
Urobilinogen, UA: 0.2
pH, UA: 6

## 2013-08-19 LAB — COMPREHENSIVE METABOLIC PANEL
ALT: 17 U/L (ref 0–35)
AST: 17 U/L (ref 0–37)
Albumin: 4.6 g/dL (ref 3.5–5.2)
Alkaline Phosphatase: 67 U/L (ref 39–117)
BUN: 12 mg/dL (ref 6–23)
CO2: 25 mEq/L (ref 19–32)
Calcium: 9.6 mg/dL (ref 8.4–10.5)
Chloride: 103 mEq/L (ref 96–112)
Creat: 0.85 mg/dL (ref 0.50–1.10)
Glucose, Bld: 68 mg/dL — ABNORMAL LOW (ref 70–99)
Potassium: 4.5 mEq/L (ref 3.5–5.3)
Sodium: 138 mEq/L (ref 135–145)
Total Bilirubin: 0.4 mg/dL (ref 0.3–1.2)
Total Protein: 7 g/dL (ref 6.0–8.3)

## 2013-08-19 NOTE — Progress Notes (Addendum)
Subjective:  This chart was scribed for Elvina Sidle, MD by Carl Best, Medical Scribe. This patient was seen in Room 12 and the patient's care was started at 9:34 AM.    Patient ID: Tricia Bowen, female    DOB: 1961-06-11, 52 y.o.   MRN: [id number]  HPI HPI Comments: Tricia Bowen is a 52 y.o. female with a history of right leg cramps who presents to the [facility] complaining of worsening, right leg cramps that started last night.  She states that her leg cramps started 6 months ago.  She states that the leg cramps worsen at night and are aggravated with flexion.  She states that she is worried that she has a blood clot in her right leg.  The patient lists shortness of breath as an associated symptom.  The patient states that she take Albuterol, Singulair, Symbicort, and Allegra regularly.  The patient states that she is menopausal.    The patient states that she had pneumonia and was seen by Dr. Neva Seat on July 17, 2013.  She states that she gave a urine sample which revealed hematuria and an infection.      Past Medical History  Diagnosis Date  . Allergy   . Anemia   . Asthma    Past Surgical History  Procedure Laterality Date  . Tubal ligation     History   Social History  . Marital Status: Married    Spouse Name: N/A    Number of Children: N/A  . Years of Education: N/A   Occupational History  . Not on file.   Social History Main Topics  . Smoking status: Former Smoker    Types: Cigarettes    Quit date: 06/05/2008  . Smokeless tobacco: Not on file  . Alcohol Use: Yes     Comment: 7/wk  . Drug Use: No  . Sexual Activity: Yes    Birth Control/ Protection: None   Other Topics Concern  . Not on file   Social History Narrative  . No narrative on file   Filed Vitals:   08/19/13 0859  BP: 126/76  Pulse: 82  Temp: 98.4 F (36.9 C)  TempSrc: Oral  Resp: 18  Height: 5\' 6"  (1.676 m)  Weight: 204 lb 12.8 oz (92.897 kg)  SpO2: 95%      Review of Systems No extreme SOB, redness of leg    Objective:   Physical Exam NAD Chest:  CTA Heart:  Reg, no murmur or gallop Skin: normal Ext:  Tender right post calf, no cord, 1+ pedal edema Results for orders placed in visit on 07/26/13  URINE CULTURE      Result Value Range   Colony Count NO GROWTH     Organism ID, Bacteria NO GROWTH    POCT URINALYSIS DIPSTICK      Result Value Range   Color, UA yellow     Clarity, UA hazy     Glucose, UA neg     Bilirubin, UA neg     Ketones, UA neg     Spec Grav, UA 1.020     Blood, UA trace     pH, UA 6.0     Protein, UA neg     Urobilinogen, UA 0.2     Nitrite, UA neg     Leukocytes, UA Negative    POCT UA - MICROSCOPIC ONLY      Result Value Range   WBC, Ur, HPF, POC 2-4  RBC, urine, microscopic 2-4     Bacteria, U Microscopic trace     Mucus, UA neg     Epithelial cells, urine per micros 2-3     Crystals, Ur, HPF, POC neg     Casts, Ur, LPF, POC neg     Yeast, UA neg    POCT SKIN KOH      Result Value Range   Skin KOH, POC Negative     Results for orders placed in visit on 08/19/13  POCT URINALYSIS DIPSTICK      Result Value Range   Color, UA yellow     Clarity, UA clear     Glucose, UA neg     Bilirubin, UA neg     Ketones, UA neg     Spec Grav, UA <=1.005     Blood, UA trace     pH, UA 6.0     Protein, UA neg     Urobilinogen, UA 0.2     Nitrite, UA neg     Leukocytes, UA Negative    POCT UA - MICROSCOPIC ONLY      Result Value Range   WBC, Ur, HPF, POC neg     RBC, urine, microscopic neg     Bacteria, U Microscopic trace     Mucus, UA neg     Epithelial cells, urine per micros 0-4     Crystals, Ur, HPF, POC neg     Casts, Ur, LPF, POC neg     Yeast, UA neg    POCT CBC      Result Value Range   WBC 6.2  4.6 - 10.2 K/uL   Lymph, poc 2.0  0.6 - 3.4   POC LYMPH PERCENT 32.9  10 - 50 %L   MID (cbc) 0.4  0 - 0.9   POC MID % 6.5  0 - 12 %M   POC Granulocyte 3.8  2 - 6.9   Granulocyte  percent 60.6  37 - 80 %G   RBC 4.37  4.04 - 5.48 M/uL   Hemoglobin 13.7  12.2 - 16.2 g/dL   HCT, POC 16.1  09.6 - 47.9 %   MCV 100.2 (*) 80 - 97 fL   MCH, POC 31.4 (*) 27 - 31.2 pg   MCHC 31.3 (*) 31.8 - 35.4 g/dL   RDW, POC 04.5     Platelet Count, POC 248  142 - 424 K/uL   MPV 9.4  0 - 99.8 fL            Assessment & Plan:   Shortness of breath - Plan: POCT urinalysis dipstick, POCT UA - Microscopic Only, POCT CBC, Lower Extremity Venous Duplex Right  Pain in joint, lower leg, right - Plan: POCT urinalysis dipstick, POCT UA - Microscopic Only, POCT CBC, Comprehensive metabolic panel  Calf pain, right - Plan: Lower Extremity Venous Duplex Right  Signed, Elvina Sidle, MD  Addendum: Venous Doppler is negative

## 2013-08-19 NOTE — Progress Notes (Signed)
VASCULAR LAB PRELIMINARY  PRELIMINARY  PRELIMINARY  PRELIMINARY  Right lower extremity venous Doppler completed.    Preliminary report:  There is no DVT or SVT noted in the right lower extremity.  Lesly Pontarelli, RVT 08/19/2013, 5:15 PM

## 2013-08-19 NOTE — Patient Instructions (Addendum)
Go to [facility]. REGISTER AS OUTPATIENT VENOUS DOPPLER. DO NOT REGISTER AS EMERGENCY ROOM PATIENT. They will let the tech know you are there.

## 2013-10-05 HISTORY — PX: COLONOSCOPY: SHX174

## 2014-02-06 ENCOUNTER — Other Ambulatory Visit: Payer: Self-pay | Admitting: Family Medicine

## 2014-04-01 ENCOUNTER — Other Ambulatory Visit: Payer: Self-pay | Admitting: Physician Assistant

## 2014-04-02 ENCOUNTER — Ambulatory Visit (INDEPENDENT_AMBULATORY_CARE_PROVIDER_SITE_OTHER): Payer: BC Managed Care – PPO | Admitting: Internal Medicine

## 2014-04-02 VITALS — BP 132/78 | HR 82 | Temp 98.1°F | Resp 18 | Ht 66.0 in | Wt 208.4 lb

## 2014-04-02 DIAGNOSIS — J45909 Unspecified asthma, uncomplicated: Secondary | ICD-10-CM

## 2014-04-02 DIAGNOSIS — J4540 Moderate persistent asthma, uncomplicated: Secondary | ICD-10-CM

## 2014-04-02 MED ORDER — BUDESONIDE-FORMOTEROL FUMARATE 160-4.5 MCG/ACT IN AERO: 2.0000 | INHALATION_SPRAY | Freq: Two times a day (BID) | RESPIRATORY_TRACT | Status: DC

## 2014-04-02 MED ORDER — ALBUTEROL SULFATE HFA 108 (90 BASE) MCG/ACT IN AERS: 2.0000 | INHALATION_SPRAY | Freq: Four times a day (QID) | RESPIRATORY_TRACT | Status: DC | PRN

## 2014-04-02 NOTE — Progress Notes (Signed)
   Subjective:    Patient ID: Tricia Bowen, female    DOB: 04/06/1961, 53 y.o.   MRN: [id number]  HPI %53 year old female is here with need for renewal of her inhalers for asthma. No sob, no chest pain, no cough, no fever. She feels her asthma is well controlled on her regular medications. She is using them as directed and has not run out o f meds yet. She is a non smoker.   Review of Systems  All other systems reviewed and are negative.      Objective:   Physical Exam  Vitals reviewed. Constitutional: She is oriented to person, place, and time. She appears well-developed and well-nourished.  HENT:  Head: Normocephalic and atraumatic.  Nose: Nose normal.  Mouth/Throat: Oropharynx is clear and moist.  Eyes: Conjunctivae and EOM are normal. Pupils are equal, round, and reactive to light.  Neck: Normal range of motion. Neck supple.  Cardiovascular: Normal rate, regular rhythm, normal heart sounds and intact distal pulses.   Pulmonary/Chest: Effort normal and breath sounds normal.  Abdominal: Soft.  Musculoskeletal: Normal range of motion.  Neurological: She is alert and oriented to person, place, and time.  Skin: Skin is warm and dry.  Psychiatric: She has a normal mood and affect. Her behavior is normal. Judgment normal.          Assessment & Plan:  Well controlled asthma needs medication refills.

## 2014-04-17 ENCOUNTER — Ambulatory Visit (INDEPENDENT_AMBULATORY_CARE_PROVIDER_SITE_OTHER): Payer: BC Managed Care – PPO | Admitting: Family Medicine

## 2014-04-17 ENCOUNTER — Encounter: Payer: Self-pay | Admitting: Internal Medicine

## 2014-04-17 VITALS — BP 122/82 | HR 78 | Temp 97.8°F | Resp 16 | Ht 66.25 in | Wt 209.8 lb

## 2014-04-17 DIAGNOSIS — Z1239 Encounter for other screening for malignant neoplasm of breast: Secondary | ICD-10-CM

## 2014-04-17 DIAGNOSIS — Z1211 Encounter for screening for malignant neoplasm of colon: Secondary | ICD-10-CM

## 2014-04-17 DIAGNOSIS — Z124 Encounter for screening for malignant neoplasm of cervix: Secondary | ICD-10-CM

## 2014-04-17 DIAGNOSIS — Z0000 Encounter for general adult medical examination without abnormal findings: Secondary | ICD-10-CM

## 2014-04-17 DIAGNOSIS — J4540 Moderate persistent asthma, uncomplicated: Secondary | ICD-10-CM

## 2014-04-17 DIAGNOSIS — J45909 Unspecified asthma, uncomplicated: Secondary | ICD-10-CM

## 2014-04-17 LAB — TSH: TSH: 2.283 u[IU]/mL (ref 0.350–4.500)

## 2014-04-17 LAB — POCT URINALYSIS DIPSTICK
Bilirubin, UA: NEGATIVE
Glucose, UA: NEGATIVE
Ketones, UA: NEGATIVE
Leukocytes, UA: NEGATIVE
Nitrite, UA: NEGATIVE
Protein, UA: NEGATIVE
Spec Grav, UA: 1.015
Urobilinogen, UA: 0.2
pH, UA: 5

## 2014-04-17 LAB — POCT UA - MICROSCOPIC ONLY
Casts, Ur, LPF, POC: NEGATIVE
Crystals, Ur, HPF, POC: NEGATIVE
Mucus, UA: POSITIVE
Yeast, UA: NEGATIVE

## 2014-04-17 LAB — COMPLETE METABOLIC PANEL WITH GFR
AST: 17 U/L (ref 0–37)
Albumin: 4.4 g/dL (ref 3.5–5.2)
BUN: 11 mg/dL (ref 6–23)
CO2: 28 mEq/L (ref 19–32)
Calcium: 9.2 mg/dL (ref 8.4–10.5)
Creat: 0.79 mg/dL (ref 0.50–1.10)
GFR, Est African American: >89 mL/min
GFR, Est Non African American: 86 mL/min
Glucose, Bld: 86 mg/dL (ref 70–99)
Potassium: 4.8 mEq/L (ref 3.5–5.3)
Sodium: 141 mEq/L (ref 135–145)
Total Bilirubin: 0.5 mg/dL (ref 0.2–1.2)
Total Protein: 6.6 g/dL (ref 6.0–8.3)

## 2014-04-17 LAB — CBC
HCT: 40.7 % (ref 36.0–46.0)
Hemoglobin: 14.2 g/dL (ref 12.0–15.0)
MCH: 31.9 pg (ref 26.0–34.0)
MCHC: 34.9 g/dL (ref 30.0–36.0)
MCV: 91.5 fL (ref 78.0–100.0)
Platelets: 284 10*3/uL (ref 150–400)
RBC: 4.45 MIL/uL (ref 3.87–5.11)
RDW: 12.6 % (ref 11.5–15.5)
WBC: 7 10*3/uL (ref 4.0–10.5)

## 2014-04-17 LAB — COMPLETE METABOLIC PANEL WITHOUT GFR
ALT: 18 U/L (ref 0–35)
Alkaline Phosphatase: 57 U/L (ref 39–117)
Chloride: 104 meq/L (ref 96–112)

## 2014-04-17 LAB — LIPID PANEL
Cholesterol: 199 mg/dL (ref 0–200)
HDL: 67 mg/dL (ref >39)
LDL Cholesterol: 108 mg/dL — ABNORMAL HIGH (ref 0–99)
Total CHOL/HDL Ratio: 3 Ratio
Triglycerides: 121 mg/dL (ref <150)
VLDL: 24 mg/dL (ref 0–40)

## 2014-04-17 MED ORDER — ALBUTEROL SULFATE HFA 108 (90 BASE) MCG/ACT IN AERS: 2.0000 | INHALATION_SPRAY | Freq: Four times a day (QID) | RESPIRATORY_TRACT | Status: DC | PRN

## 2014-04-17 MED ORDER — BUDESONIDE-FORMOTEROL FUMARATE 160-4.5 MCG/ACT IN AERO: 2.0000 | INHALATION_SPRAY | Freq: Two times a day (BID) | RESPIRATORY_TRACT | Status: DC

## 2014-04-17 NOTE — Progress Notes (Signed)
Chief Complaint:  Chief Complaint  Patient presents with  . CPE    Work PE, bloodwork    HPI: Tricia Bowen is a 53 y.o. female who is here for  Annual visit Annual visit with a pap was in 2013 in [place], we do not have records.  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 She usually has to come back 2x each time.  IS not UTD on colonscopy She had a colonscopy at age 65 for colitis.  Past Medical History  Diagnosis Date  . Allergy   . Anemia   . Asthma    Past Surgical History  Procedure Laterality Date  . Tubal ligation     History   Social History  . Marital Status: Married    Spouse Name: N/A    Number of Children: N/A  . Years of Education: N/A   Social History Main Topics  . Smoking status: Former Smoker    Types: Cigarettes    Quit date: 06/05/2008  . Smokeless tobacco: Never Used  . Alcohol Use: Yes     Comment: 7/wk  . Drug Use: No  . Sexual Activity: Yes    Birth Control/ Protection: None   Other Topics Concern  . None   Social History Narrative  . None   Family History  Problem Relation Age of Onset  . Alzheimer's disease Mother   . Heart disease Father   . Heart disease Maternal Grandmother   . Heart disease Maternal Grandfather   . Alzheimer's disease Paternal Grandmother   . Heart disease Paternal Grandfather    Allergies  Allergen Reactions  . Penicillins Anaphylaxis   Prior to Admission medications   Medication Sig Start Date End Date Taking? Authorizing Provider  albuterol (PROAIR HFA) 108 (90 BASE) MCG/ACT inhaler Inhale 2 puffs into the lungs every 6 (six) hours as needed. PATIENT NEEDS OFFICE VISIT FOR ADDITIONAL REFILLS 04/02/14  Yes Boris Lown, MD  budesonide-formoterol [facility]) 160-4.5 MCG/ACT inhaler Inhale 2  puffs into the lungs 2 (two) times daily. PATIENT NEEDS OFFICE VISIT FOR ADDITIONAL REFILLS 04/02/14  Yes Boris Lown, MD  diphenhydrAMINE (BENADRYL) 25 MG tablet Take 25 mg by mouth every 6 (six) hours as needed.   Yes Historical Provider, MD  fexofenadine (ALLEGRA) 180 MG tablet Take 180 mg by mouth daily.   Yes Historical Provider, MD  clotrimazole (LOTRIMIN) 1 % cream Apply topically 2 (two) times daily. 07/26/13   Wendie Agreste, MD  montelukast (SINGULAIR) 10 MG tablet Take 1 tablet (10 mg total) by mouth at bedtime. 01/14/13   Robyn Haber, MD     ROS: The patient denies fevers, chills, night sweats, unintentional weight loss, chest pain, palpitations, wheezing, dyspnea on exertion, nausea, vomiting, abdominal pain, dysuria, hematuria, melena, numbness, weakness, or tingling.   All other systems have been reviewed and were otherwise negative with the exception of those mentioned in the HPI and as above.    PHYSICAL EXAM: Filed Vitals:   04/17/14 0851  BP: 122/82  Pulse: 78  Temp: 97.8 F (36.6 C)  Resp: 16   Filed Vitals:   04/17/14 0851  Height: 5' 6.25" (1.683 m)  Weight: 209 lb 12.8 oz (95.165 kg)   Body mass index  is 33.6 kg/(m^2).  General: Alert, no acute distress HEENT:  Normocephalic, atraumatic, oropharynx patent. EOMI, PERRLA Cardiovascular:  Regular rate and rhythm, no rubs murmurs or gallops.  No Carotid bruits, radial pulse intact. No pedal edema.  Respiratory: Clear to auscultation bilaterally.  No wheezes, rales, or rhonchi.  No cyanosis, no use of accessory musculature GI: No organomegaly, abdomen is soft and non-tender, positive bowel sounds.  No masses. Skin: No rashes. Neurologic: Facial musculature symmetric. Psychiatric: Patient is appropriate throughout our interaction. Lymphatic: No cervical lymphadenopathy Musculoskeletal: Gait intact. Breast exam normal GU-some mild dc,normal appearing  cervix is tilted I was only able to see the cervix  briefly to get a sample but not sure if I was able to get the brush and spatula far enough No CMT, no masses or lesions   LABS: Results for orders placed in visit on 04/17/14  POCT UA - MICROSCOPIC ONLY      Result Value Ref Range   WBC, Ur, HPF, POC 3-4     RBC, urine, microscopic 7-12     Bacteria, U Microscopic 2+     Mucus, UA pos     Epithelial cells, urine per micros 2-4     Crystals, Ur, HPF, POC neg     Casts, Ur, LPF, POC neg     Yeast, UA neg    POCT URINALYSIS DIPSTICK      Result Value Ref Range   Color, UA yellow     Clarity, UA clear     Glucose, UA neg     Bilirubin, UA neg     Ketones, UA neg     Spec Grav, UA 1.015     Blood, UA mod     pH, UA 5.0     Protein, UA neg     Urobilinogen, UA 0.2     Nitrite, UA neg     Leukocytes, UA Negative       EKG/XRAY:   Primary read interpreted by Dr. Marin Comment at [facility].   ASSESSMENT/PLAN: Encounter Diagnoses  Name Primary?  . Annual physical exam Yes  . Unspecified asthma(493.90)   . Screening for colon cancer   . Screening for breast cancer   . Screening for cervical cancer   . Asthma, chronic, moderate persistent, uncomplicated    53 y/o female here for annual with pap Mammogram and also colonscopy pending Annual labs pending Refilled asthma meds x 1 year F/u in 1 year Forms copied and placed in box, labs were resulted and form completed and placed in nurses box to be maile   Gross sideeffects, risk and benefits, and alternatives of medications d/w patient. Patient is aware that all medications have potential sideeffects and we are unable to predict every sideeffect or drug-drug interaction that may occur.  Tawnie Ehresman, [place], DO 04/17/2014 9:49 AM

## 2014-04-18 LAB — PAP IG W/ RFLX HPV ASCU

## 2014-04-23 ENCOUNTER — Telehealth: Payer: Self-pay

## 2014-04-23 NOTE — Telephone Encounter (Signed)
PT STATES SHE HAD A PE DONE AND HAD AN ENVELOPE TO PUT IT IN TO BE SENT OVER, WANTED TO KNOW THE STATUS. PLEASE CALL [phone]

## 2014-04-24 NOTE — Telephone Encounter (Signed)
Please put the form in the nurses box once the labs are reviewed. I would happy to mail for patient.

## 2014-04-25 ENCOUNTER — Ambulatory Visit (HOSPITAL_COMMUNITY)
Admission: RE | Admit: 2014-04-25 | Discharge: 2014-04-25 | Disposition: A | Discharge status: Home / Self Care | Payer: BC Managed Care – HMO | Source: Ambulatory Visit | Attending: Family Medicine | Admitting: Family Medicine

## 2014-04-25 DIAGNOSIS — Z1239 Encounter for other screening for malignant neoplasm of breast: Secondary | ICD-10-CM

## 2014-04-25 DIAGNOSIS — Z1231 Encounter for screening mammogram for malignant neoplasm of breast: Secondary | ICD-10-CM | POA: Insufficient documentation

## 2014-04-25 NOTE — Telephone Encounter (Signed)
Already been done when I got the results. I had put it in the nurse's box myself.

## 2014-05-24 ENCOUNTER — Encounter: Payer: BC Managed Care – PPO | Admitting: Family Medicine

## 2014-06-13 ENCOUNTER — Ambulatory Visit (AMBULATORY_SURGERY_CENTER): Payer: BC Managed Care – PPO | Admitting: *Deleted

## 2014-06-13 VITALS — Ht 66.0 in | Wt 207.6 lb

## 2014-06-13 DIAGNOSIS — Z1211 Encounter for screening for malignant neoplasm of colon: Secondary | ICD-10-CM

## 2014-06-13 MED ORDER — MOVIPREP 100 G PO SOLR: ORAL | Status: DC

## 2014-06-13 NOTE — Progress Notes (Signed)
No allergies to eggs or soy.  Hard to wake up from anesthesia with 1 anesthesia  Pt given Emmi instructions for colonoscopy  No oxygen use  No diet drug use

## 2014-06-22 ENCOUNTER — Encounter: Payer: Self-pay | Admitting: Internal Medicine

## 2014-06-27 ENCOUNTER — Ambulatory Visit (AMBULATORY_SURGERY_CENTER): Payer: BC Managed Care – PPO | Admitting: Internal Medicine

## 2014-06-27 ENCOUNTER — Encounter: Payer: Self-pay | Admitting: Internal Medicine

## 2014-06-27 VITALS — BP 109/71 | HR 68 | Temp 97.1°F | Resp 34 | Ht 66.0 in | Wt 207.0 lb

## 2014-06-27 DIAGNOSIS — D126 Benign neoplasm of colon, unspecified: Secondary | ICD-10-CM

## 2014-06-27 DIAGNOSIS — D123 Benign neoplasm of transverse colon: Secondary | ICD-10-CM

## 2014-06-27 DIAGNOSIS — D125 Benign neoplasm of sigmoid colon: Secondary | ICD-10-CM

## 2014-06-27 DIAGNOSIS — K633 Ulcer of intestine: Secondary | ICD-10-CM

## 2014-06-27 DIAGNOSIS — Z1211 Encounter for screening for malignant neoplasm of colon: Secondary | ICD-10-CM

## 2014-06-27 DIAGNOSIS — D124 Benign neoplasm of descending colon: Secondary | ICD-10-CM

## 2014-06-27 MED ORDER — SODIUM CHLORIDE 0.9 % IV SOLN: 500.0000 mL | INTRAVENOUS | Status: DC

## 2014-06-27 NOTE — Progress Notes (Signed)
Called to room to assist during endoscopic procedure.  Patient ID and intended procedure confirmed with present staff. Received instructions for my participation in the procedure from the performing physician.  

## 2014-06-27 NOTE — Patient Instructions (Signed)
YOU HAD AN ENDOSCOPIC PROCEDURE TODAY AT THE [facility]: Refer to the procedure report that was given to you for any specific questions about what was found during the examination.  If the procedure report does not answer your questions, please call your gastroenterologist to clarify.  If you requested that your care partner not be given the details of your procedure findings, then the procedure report has been included in a sealed envelope for you to review at your convenience later.  YOU SHOULD EXPECT: Some feelings of bloating in the abdomen. Passage of more gas than usual.  Walking can help get rid of the air that was put into your GI tract during the procedure and reduce the bloating. If you had a lower endoscopy (such as a colonoscopy or flexible sigmoidoscopy) you may notice spotting of blood in your stool or on the toilet paper. If you underwent a bowel prep for your procedure, then you may not have a normal bowel movement for a few days.  DIET: Your first meal following the procedure should be a light meal and then it is ok to progress to your normal diet.  A half-sandwich or bowl of soup is an example of a good first meal.  Heavy or fried foods are harder to digest and may make you feel nauseous or bloated.  Likewise meals heavy in dairy and vegetables can cause extra gas to form and this can also increase the bloating.  Drink plenty of fluids but you should avoid alcoholic beverages for 24 hours.  ACTIVITY: Your care partner should take you home directly after the procedure.  You should plan to take it easy, moving slowly for the rest of the day.  You can resume normal activity the day after the procedure however you should NOT DRIVE or use heavy machinery for 24 hours (because of the sedation medicines used during the test).    SYMPTOMS TO REPORT IMMEDIATELY: A gastroenterologist can be reached at any hour.  During normal business hours, 8:30 AM to 5:00 PM Monday through Friday,  call [phone].  After hours and on weekends, please call the GI answering service at [phone] who will take a message and have the physician on call contact you.   Following lower endoscopy (colonoscopy or flexible sigmoidoscopy):  Excessive amounts of blood in the stool  Significant tenderness or worsening of abdominal pains  Swelling of the abdomen that is new, acute  Fever of 100F or higher  FOLLOW UP: If any biopsies were taken you will be contacted by phone or by letter within the next 1-3 weeks.  Call your gastroenterologist if you have not heard about the biopsies in 3 weeks.  Our staff will call the home number listed on your records the next business day following your procedure to check on you and address any questions or concerns that you may have at that time regarding the information given to you following your procedure. This is a courtesy call and so if there is no answer at the home number and we have not heard from you through the emergency physician on call, we will assume that you have returned to your regular daily activities without incident.  SIGNATURES/CONFIDENTIALITY: You and/or your care partner have signed paperwork which will be entered into your electronic medical record.  These signatures attest to the fact that that the information above on your After Visit Summary has been reviewed and is understood.  Full responsibility of the confidentiality of this   this discharge information lies with you and/or your care-partner.  Await pathology  Continue your normal medications  Please read over handouts about polyps, diverticulosis and high fiber diets 

## 2014-06-27 NOTE — Op Note (Signed)
[facility]  [address] [place], [address]   COLONOSCOPY PROCEDURE REPORT  PATIENT: Ashelynn, Marks  MR#: [id number] BIRTHDATE: 1961-09-30 , 53  yrs. old GENDER: female ENDOSCOPIST: Jerene Bears, MD REFERRED BY: Rikki Spearing, MD PROCEDURE DATE:  06/27/2014 PROCEDURE:   Colonoscopy with biopsy and Colonoscopy with snare polypectomy First Screening Colonoscopy - Avg.  risk and is 50 yrs.  old or older Yes.  Prior Negative Screening - Now for repeat screening. N/A  History of Adenoma - Now for follow-up colonoscopy & has been > or = to 3 yrs.  N/A  Polyps Removed Today? Yes. ASA CLASS:   Class II INDICATIONS:average risk for colorectal cancer. MEDICATIONS: Monitored anesthesia care and Propofol 320 mg  DESCRIPTION OF PROCEDURE:   After the risks benefits and alternatives of the procedure were thoroughly explained, informed consent was obtained.  The digital rectal exam revealed no rectal mass.   The LB [id number]  endoscope was introduced through the anus and advanced to the terminal ileum which was intubated for a short distance. No adverse events experienced.   The quality of the prep was good, using MoviPrep  The instrument was then slowly withdrawn as the colon was fully examined.      COLON FINDINGS: The examined terminal ileum appeared to be normal. Six sessile polyps ranging from 4 to 8 mm in size were found in the sigmoid colon (2), descending colon (1), and transverse colon (3). Polypectomies were performed with a cold snare.  The resection was complete, the polyp tissue was completely retrieved and sent to histology.   There was mild diverticulosis noted in the left colon, descending colon, and at the hepatic flexure.   A single non-bleeding ulcer measuring 7 x 72mm in size was found in the sigmoid colon with surrounding erythema and edema.  Biopsies were taken to exclude dysplasia.  Retroflexed views small revealed external hemorrhoids. The time to  cecum=2 minutes 02 seconds. Withdrawal time=17 minutes 51 seconds.  The scope was withdrawn and the procedure completed. COMPLICATIONS: There were no complications.  ENDOSCOPIC IMPRESSION: 1.   The examined terminal ileum appeared to be normal 2.   Six sessile polyps ranging from 4 to 68mm in size were found in the sigmoid colon, descending colon, and transverse colon; polypectomies were performed with a cold snare 3.   Mild diverticulosis was noted in the left colon, descending colon, and at the hepatic flexure 4.   Single ulcer measuring 7 x 63mm in size was found in the sigmoid colon; biopsies were taken  RECOMMENDATIONS: 1.  Await pathology results 2.  High fiber diet 3.  If the polyps removed today are proven to be adenomatous (pre-cancerous) polyps, you will need a colonoscopy in 3 years. Otherwise you should continue to follow colorectal cancer screening guidelines for "routine risk" patients with a colonoscopy in 10 years.  You will receive a letter within 1-2 weeks with the results of your biopsy as well as final recommendations.  Please call my office if you have not received a letter after 3 weeks.  eSigned:  Jerene Bears, MD 06/27/2014 11:10 AM   cc: The Patient; PCP   PATIENT NAME:  Ariann, Khaimov MR#: [id number]

## 2014-06-27 NOTE — Progress Notes (Signed)
Procedure ends, to recovery, report given and VSS. 

## 2014-06-28 ENCOUNTER — Telehealth: Payer: Self-pay | Admitting: *Deleted

## 2014-06-28 NOTE — Telephone Encounter (Signed)
No answer left message to call if questions or concerns. 

## 2014-07-04 ENCOUNTER — Encounter: Payer: Self-pay | Admitting: Internal Medicine

## 2014-08-08 ENCOUNTER — Encounter (HOSPITAL_COMMUNITY): Payer: Self-pay

## 2014-08-08 ENCOUNTER — Emergency Department (HOSPITAL_COMMUNITY): Payer: BC Managed Care – PPO

## 2014-08-08 ENCOUNTER — Emergency Department (HOSPITAL_COMMUNITY)
Admission: EM | Admit: 2014-08-08 | Discharge: 2014-08-08 | Disposition: A | Discharge status: Home / Self Care | Payer: BC Managed Care – PPO | Attending: Emergency Medicine | Admitting: Emergency Medicine

## 2014-08-08 DIAGNOSIS — Z8719 Personal history of other diseases of the digestive system: Secondary | ICD-10-CM | POA: Diagnosis not present

## 2014-08-08 DIAGNOSIS — Z79899 Other long term (current) drug therapy: Secondary | ICD-10-CM | POA: Diagnosis not present

## 2014-08-08 DIAGNOSIS — M5440 Lumbago with sciatica, unspecified side: Secondary | ICD-10-CM | POA: Diagnosis not present

## 2014-08-08 DIAGNOSIS — M1990 Unspecified osteoarthritis, unspecified site: Secondary | ICD-10-CM | POA: Diagnosis not present

## 2014-08-08 DIAGNOSIS — Z87891 Personal history of nicotine dependence: Secondary | ICD-10-CM | POA: Insufficient documentation

## 2014-08-08 DIAGNOSIS — Z880 Allergy status to penicillin: Secondary | ICD-10-CM | POA: Insufficient documentation

## 2014-08-08 DIAGNOSIS — M545 Low back pain: Secondary | ICD-10-CM | POA: Diagnosis present

## 2014-08-08 DIAGNOSIS — R109 Unspecified abdominal pain: Secondary | ICD-10-CM

## 2014-08-08 DIAGNOSIS — Z862 Personal history of diseases of the blood and blood-forming organs and certain disorders involving the immune mechanism: Secondary | ICD-10-CM | POA: Diagnosis not present

## 2014-08-08 DIAGNOSIS — J45909 Unspecified asthma, uncomplicated: Secondary | ICD-10-CM | POA: Insufficient documentation

## 2014-08-08 LAB — CBC WITH DIFFERENTIAL/PLATELET
BASOS ABS: 0 10*3/uL (ref 0.0–0.1)
BASOS PCT: 0 % (ref 0–1)
Eosinophils Absolute: 0.2 10*3/uL (ref 0.0–0.7)
Eosinophils Relative: 2 % (ref 0–5)
HEMATOCRIT: 40.9 % (ref 36.0–46.0)
Hemoglobin: 13.7 g/dL (ref 12.0–15.0)
LYMPHS PCT: 19 % (ref 12–46)
Lymphs Abs: 1.7 10*3/uL (ref 0.7–4.0)
MCH: 31.6 pg (ref 26.0–34.0)
MCHC: 33.5 g/dL (ref 30.0–36.0)
MCV: 94.5 fL (ref 78.0–100.0)
MONO ABS: 0.4 10*3/uL (ref 0.1–1.0)
Monocytes Relative: 4 % (ref 3–12)
NEUTROS ABS: 7 10*3/uL (ref 1.7–7.7)
NEUTROS PCT: 75 % (ref 43–77)
PLATELETS: 275 10*3/uL (ref 150–400)
RBC: 4.33 MIL/uL (ref 3.87–5.11)
RDW: 12.1 % (ref 11.5–15.5)
WBC: 9.3 10*3/uL (ref 4.0–10.5)

## 2014-08-08 LAB — COMPREHENSIVE METABOLIC PANEL
ALT: 14 U/L (ref 0–35)
AST: 14 U/L (ref 0–37)
Albumin: 4.2 g/dL (ref 3.5–5.2)
Alkaline Phosphatase: 68 U/L (ref 39–117)
Anion gap: 13 (ref 5–15)
BILIRUBIN TOTAL: 0.5 mg/dL (ref 0.3–1.2)
BUN: 10 mg/dL (ref 6–23)
CHLORIDE: 101 meq/L (ref 96–112)
CO2: 23 meq/L (ref 19–32)
Calcium: 9.6 mg/dL (ref 8.4–10.5)
Creatinine, Ser: 0.8 mg/dL (ref 0.50–1.10)
GFR calc Af Amer: >90 mL/min (ref >90)
GFR calc non Af Amer: 83 mL/min — ABNORMAL LOW (ref >90)
Glucose, Bld: 109 mg/dL — ABNORMAL HIGH (ref 70–99)
Potassium: 4 mEq/L (ref 3.7–5.3)
SODIUM: 137 meq/L (ref 137–147)
Total Protein: 7.3 g/dL (ref 6.0–8.3)

## 2014-08-08 LAB — LIPASE, BLOOD: Lipase: 32 U/L (ref 11–59)

## 2014-08-08 MED ORDER — ONDANSETRON HCL 4 MG/2ML IJ SOLN
4.0000 mg | Freq: Once | INTRAMUSCULAR | Status: AC
  Administered 2014-08-08: 4 mg via INTRAVENOUS
  Filled 2014-08-08: qty 2

## 2014-08-08 MED ORDER — IBUPROFEN 600 MG PO TABS: 600.0000 mg | ORAL_TABLET | Freq: Three times a day (TID) | ORAL | Status: AC

## 2014-08-08 MED ORDER — SODIUM CHLORIDE 0.9 % IV SOLN
1000.0000 mL | Freq: Once | INTRAVENOUS | Status: AC
  Administered 2014-08-08: 1000 mL via INTRAVENOUS

## 2014-08-08 MED ORDER — HYDROCODONE-ACETAMINOPHEN 5-325 MG PO TABS: 1.0000 | ORAL_TABLET | Freq: Four times a day (QID) | ORAL | Status: DC | PRN

## 2014-08-08 MED ORDER — DIAZEPAM 5 MG PO TABS: 5.0000 mg | ORAL_TABLET | Freq: Two times a day (BID) | ORAL | Status: DC

## 2014-08-08 MED ORDER — HYDROMORPHONE HCL 1 MG/ML IJ SOLN
0.5000 mg | Freq: Once | INTRAMUSCULAR | Status: AC
  Administered 2014-08-08: 0.5 mg via INTRAVENOUS
  Filled 2014-08-08: qty 1

## 2014-08-08 NOTE — ED Notes (Addendum)
Per pt, pain in left lower back which radiates down left leg.  No numbness or tingling.  No change in urination or noted trauma.  No fever noted.  No hx of kidney stones.

## 2014-08-08 NOTE — Discharge Instructions (Signed)
As discussed, your evaluation today has been largely reassuring.  But, it is important that you monitor your condition carefully, and do not hesitate to return to the ED if you develop new, or concerning changes in your condition. ? ?Otherwise, please follow-up with your physician for appropriate ongoing care. ? ?

## 2014-08-08 NOTE — ED Provider Notes (Signed)
CSN: [id number]     Arrival date & time 08/08/14  5176 History   First MD Initiated Contact with Patient 08/08/14 0935     Chief Complaint  Patient presents with  . Back Pain  . Leg Pain      HPI  Patient presents with concern of new low back/flank pain. Pain began in the past few days, was initially vague, but over the past 24 hours has become consistent, focally about the left lower back, left flank.  Pain radiates anteriorly.  There is occasional radiation down the left lateral leg.  No urinary changes noted. No relief with OTC medication. Patient has a history of prior mass, requiring excision several years ago in the left low back. Patient also has recent colonoscopy that was abnormal, with a ulcer found on the sigmoid colon. Patient denies fevers, chills, chest pain, dyspnea.   Past Medical History  Diagnosis Date  . Allergy   . Anemia   . Asthma   . Arthritis     neck  . GERD (gastroesophageal reflux disease)    Past Surgical History  Procedure Laterality Date  . Tubal ligation  1985  . Cyst removal leg Right 1979    foot  . Tonsillectomy  1980  . Lipoma excision Left 2011    hip   Family History  Problem Relation Age of Onset  . Alzheimer's disease Mother   . Colon polyps Mother 16  . Heart disease Father   . Heart disease Maternal Grandmother   . Heart disease Maternal Grandfather   . Alzheimer's disease Paternal Grandmother   . Heart disease Paternal Grandfather   . Colon cancer Neg Hx    History  Substance Use Topics  . Smoking status: Former Smoker    Types: Cigarettes    Quit date: 06/05/2008  . Smokeless tobacco: Never Used  . Alcohol Use: 1.8 oz/week    3 Glasses of wine per week   OB History    No data available     Review of Systems  Constitutional:       Per HPI, otherwise negative  HENT:       Per HPI, otherwise negative  Respiratory:       Per HPI, otherwise negative  Cardiovascular:       Per HPI, otherwise negative    Gastrointestinal: Negative for vomiting.  Endocrine:       Negative aside from HPI  Genitourinary:       Neg aside from HPI   Musculoskeletal:       Per HPI, otherwise negative  Skin: Negative.   Neurological: Negative for syncope.      Allergies  Penicillins  Home Medications   Prior to Admission medications   Medication Sig Start Date End Date Taking? Authorizing Provider  albuterol (PROAIR HFA) 108 (90 BASE) MCG/ACT inhaler Inhale 2 puffs into the lungs every 6 (six) hours as needed. 04/17/14  Yes Thao P Le, DO  budesonide-formoterol (SYMBICORT) 160-4.5 MCG/ACT inhaler Inhale 2 puffs into the lungs 2 (two) times daily. 04/17/14  Yes Thao P Le, DO  diphenhydrAMINE (BENADRYL) 25 MG tablet Take 25 mg by mouth every 6 (six) hours as needed.   Yes Historical Provider, MD  fexofenadine (ALLEGRA) 180 MG tablet Take 180 mg by mouth daily as needed for allergies.    Yes Historical Provider, MD  Hydrocodone-Acetaminophen (VICODIN PO) Take 1 tablet by mouth once. Exact dose is unknown, but pt says it was possible about 10 years expired?  Historical Provider, MD  montelukast (SINGULAIR) 10 MG tablet Take 1 tablet (10 mg total) by mouth at bedtime. 01/14/13   Robyn Haber, MD   BP 136/90 mmHg  Pulse 74  Temp(Src) 98.7 F (37.1 C) (Oral)  Resp 16  SpO2 98% Physical Exam  Abdominal: There is no rigidity, no rebound and no guarding.    Musculoskeletal:       Arms: Neurological:  Patient has appropriate gait, strength is 5/5 in both hips, knees. No sensory loss.    ED Course  Procedures (including critical care time) Labs Review Labs Reviewed  COMPREHENSIVE METABOLIC PANEL - Abnormal; Notable for the following:    Glucose, Bld 109 (*)    GFR calc non Af Amer 83 (*)    All other components within normal limits  CBC WITH DIFFERENTIAL  LIPASE, BLOOD  URINALYSIS, ROUTINE W REFLEX MICROSCOPIC    Imaging Review Ct Abdomen Pelvis Wo Contrast  08/08/2014   CLINICAL DATA:   53 year old female with left lower back pain which radiates down the left leg.  EXAM: CT ABDOMEN AND PELVIS WITHOUT CONTRAST  TECHNIQUE: Multidetector CT imaging of the abdomen and pelvis was performed following the standard protocol without IV contrast.  COMPARISON:  No priors.  FINDINGS: Lower chest:  Pectus excavatum.  Otherwise, unremarkable.  Hepatobiliary: The unenhanced appearance of the liver is unremarkable. Gallbladder is normal in appearance.  Pancreas: Unremarkable.  Spleen: Unremarkable.  Adrenals/Urinary Tract: There are no abnormal calcifications within the collecting system of either kidney, along the course of either ureter, or within the lumen of the urinary bladder. No hydroureteronephrosis or perinephric stranding to suggest urinary tract obstruction at this time. The unenhanced appearance of the kidneys is unremarkable bilaterally. Bilateral adrenal glands are normal in appearance.  Stomach/Bowel: The unenhanced appearance of the stomach is normal. No pathologic dilatation of small bowel or colon. Normal appendix.  Vascular/Lymphatic: No significant atherosclerotic disease or definite aneurysm in the abdominal or pelvic vasculature. No pathologically enlarged lymph nodes are noted in the abdomen or pelvis on today's non contrast CT examination.  Reproductive: Uterus is irregular in contour, compatible with multiple small fibroids. Bilateral ovaries are unremarkable in appearance.  Other: No significant volume of ascites.  No pneumoperitoneum.  Musculoskeletal: There are no aggressive appearing lytic or blastic lesions noted in the visualized portions of the skeleton. Multifocal degenerative disc disease, most severe at L5-S1.  IMPRESSION: 1. No acute findings in the abdomen or pelvis to account for the patient's symptoms. 2. Specifically, no urinary tract calculi or findings of urinary tract obstruction are noted at this time. 3. Normal appendix. 4. Mild multilevel degenerative disc disease,  most severe at L5-S1. 5. Fibroid uterus.   Electronically Signed   By: Vinnie Langton M.D.   On: 08/08/2014 11:09    1:00 PM Patient in no distress.  We discussed all findings, the need to follow-up with orthopedics, with consideration of physical therapy.  MDM   Final diagnoses:  Midline low back pain with sciatica, sciatica laterality unspecified  Flank pain    Patient presents with low back, and left flank pain.  Patient has an interesting history of prior mass requiring excision, and given her recent abnormal colonoscopy, CT scans performed to rule out acute pathology. Patient had a largely reassuring findings.  Patient had improvement of her pain here.  Patient was discharged in stable condition to follow-up with orthopedics for further evaluation and management of likely musculoskeletal pain.    Carmin Muskrat, MD 08/08/14 [phone]

## 2015-04-28 ENCOUNTER — Other Ambulatory Visit: Payer: Self-pay | Admitting: Family Medicine

## 2015-04-30 ENCOUNTER — Other Ambulatory Visit: Payer: Self-pay

## 2015-04-30 DIAGNOSIS — J4540 Moderate persistent asthma, uncomplicated: Secondary | ICD-10-CM

## 2015-04-30 MED ORDER — ALBUTEROL SULFATE HFA 108 (90 BASE) MCG/ACT IN AERS: 2.0000 | INHALATION_SPRAY | Freq: Four times a day (QID) | RESPIRATORY_TRACT | Status: DC | PRN

## 2015-05-30 ENCOUNTER — Ambulatory Visit (INDEPENDENT_AMBULATORY_CARE_PROVIDER_SITE_OTHER): Payer: BLUE CROSS/BLUE SHIELD | Admitting: Family Medicine

## 2015-05-30 VITALS — BP 116/80 | HR 72 | Temp 98.6°F | Resp 18 | Ht 66.0 in | Wt 213.0 lb

## 2015-05-30 DIAGNOSIS — L719 Rosacea, unspecified: Secondary | ICD-10-CM

## 2015-05-30 DIAGNOSIS — R1013 Epigastric pain: Secondary | ICD-10-CM

## 2015-05-30 DIAGNOSIS — Z0000 Encounter for general adult medical examination without abnormal findings: Secondary | ICD-10-CM | POA: Diagnosis not present

## 2015-05-30 DIAGNOSIS — E785 Hyperlipidemia, unspecified: Secondary | ICD-10-CM

## 2015-05-30 DIAGNOSIS — J4540 Moderate persistent asthma, uncomplicated: Secondary | ICD-10-CM

## 2015-05-30 LAB — COMPREHENSIVE METABOLIC PANEL
ALK PHOS: 65 U/L (ref 33–130)
ALT: 23 U/L (ref 6–29)
AST: 17 U/L (ref 10–35)
Albumin: 4.7 g/dL (ref 3.6–5.1)
BUN: 16 mg/dL (ref 7–25)
CALCIUM: 9.7 mg/dL (ref 8.6–10.4)
CO2: 25 mmol/L (ref 20–31)
Chloride: 103 mmol/L (ref 98–110)
Creat: 0.83 mg/dL (ref 0.50–1.05)
Glucose, Bld: 90 mg/dL (ref 65–99)
Potassium: 4.8 mmol/L (ref 3.5–5.3)
SODIUM: 139 mmol/L (ref 135–146)
TOTAL PROTEIN: 7.2 g/dL (ref 6.1–8.1)
Total Bilirubin: 0.8 mg/dL (ref 0.2–1.2)

## 2015-05-30 LAB — POCT CBC
Granulocyte percent: 67.2 %G (ref 37–80)
HCT, POC: 43.44 % (ref 37.7–47.9)
Hemoglobin: 14.1 g/dL (ref 12.2–16.2)
LYMPH, POC: 2 (ref 0.6–3.4)
MCH: 29.6 pg (ref 27–31.2)
MCHC: 32.5 g/dL (ref 31.8–35.4)
MCV: 91.1 fL (ref 80–97)
MID (CBC): 0.6 (ref 0–0.9)
MPV: 8.2 fL (ref 0–99.8)
PLATELET COUNT, POC: 289 10*3/uL (ref 142–424)
POC Granulocyte: 5.4 (ref 2–6.9)
POC LYMPH %: 25.1 % (ref 10–50)
POC MID %: 7.7 % (ref 0–12)
RBC: 4.77 M/uL (ref 4.04–5.48)
RDW, POC: 12.6 %
WBC: 8 10*3/uL (ref 4.6–10.2)

## 2015-05-30 LAB — LIPID PANEL
CHOLESTEROL: 221 mg/dL — AB (ref 125–200)
HDL: 63 mg/dL (ref >=46)
LDL CALC: 132 mg/dL — AB (ref <130)
TRIGLYCERIDES: 132 mg/dL (ref <150)
Total CHOL/HDL Ratio: 3.5 Ratio (ref <=5.0)
VLDL: 26 mg/dL (ref <30)

## 2015-05-30 LAB — POCT GLYCOSYLATED HEMOGLOBIN (HGB A1C): Hemoglobin A1C: 5.5

## 2015-05-30 MED ORDER — METRONIDAZOLE 0.75 % EX GEL: 1.0000 "application " | Freq: Two times a day (BID) | CUTANEOUS | Status: DC

## 2015-05-30 MED ORDER — ALBUTEROL SULFATE HFA 108 (90 BASE) MCG/ACT IN AERS: 2.0000 | INHALATION_SPRAY | Freq: Four times a day (QID) | RESPIRATORY_TRACT | Status: DC | PRN

## 2015-05-30 MED ORDER — BUDESONIDE-FORMOTEROL FUMARATE 160-4.5 MCG/ACT IN AERO: 2.0000 | INHALATION_SPRAY | Freq: Two times a day (BID) | RESPIRATORY_TRACT | Status: DC

## 2015-05-30 NOTE — Progress Notes (Signed)
Physical examination:  History: Patient is here for physical examination. She has had some chronic intermittent problems with dyspepsia. She says she's been worked up and nobody ever finds anything. Since her last physical exam last year she had a problem with flank pain and CT scan was negative. She has a history of asthma and uses her Symbicort fairly regularly and Pro Air when necessary. She does have a history of anemia. She is going through menopause now so that may be better.  Past history: Medications: Symbicort Pro Air HFA Allergies: Penicillin Medical history: Allergies, asthma, anemia Surgical history: Large lipoma from buttock  Family history: Both parents are deceased. Father had heart disease, mother had Alzheimer's. 4 siblings are living and well. Grandmother also had Alzheimer.  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 She does not smoke. She drinks about 5 drinks a week. She does not use any drugs. She is married, sexually involved with her husband. She is a Land with a college degree. Does not do a lot of regular exercise.  Review of systems: Constitutional: She has some sweating which started back before she went through menopause HEENT: Has some hearing loss from an old job. She has postnasal drip and some sneezing and allergy. Her eyes itch and moistening helps Respiratory: Shortness of breath Cardiovascular: Gets leg swelling intermittently when she's been on her feet a lot Gastrointestinal: Epigastric pain as noted above. No nausea or vomiting. Does have some diarrhea. Had polyps on a colonoscopy last year. Endocrine: Unremarkable Genitourinary: Unremarkable Muscular skeletal: Has some neck pain and stiffness Dermatologic: Rash nose and cheeks Allergies/immunology she has seasonal allergies and food allergies Neurological:  Unremarkable Hematologic: Unremarkable Psychiatric: Unremarkable  Physical examination: Pleasant alert lady in no major distress. She is obese. Her TMs are normal. Eyes PERRLA. Fundi benign. She has rosacea-like erythema of her cheeks and nose. Her throat is clear. Neck supple without nodes thyromegaly. No carotid bruits. Chest clear to auscultation. Heart regular without murmurs gallops or arrhythmias. Abdomen soft without mass or tenderness. Breasts are symmetrical, full, no masses. No axillary or inguinal nodes. Pelvic and rectal not done. Extremities unremarkable without edema today. Skin normal except for the face as noted above.  Assessment: Physical examination Asthma Activity rosacea Dry eyes Dyspepsia Respiratory allergies  Plan: we will do screening labs for completion of her forms. See orders.  Results for orders placed or performed in visit on 05/30/15  POCT CBC  Result Value Ref Range   WBC 8.0 4.6 - 10.2 K/uL   Lymph, poc 2.0 0.6 - 3.4   POC LYMPH PERCENT 25.1 10 - 50 %L   MID (cbc) 0.6 0 - 0.9   POC MID % 7.7 0 - 12 %M   POC Granulocyte 5.4 2 - 6.9   Granulocyte percent 67.2 37 - 80 %G   RBC 4.77 4.04 - 5.48 M/uL   Hemoglobin 14.1 12.2 - 16.2 g/dL   HCT, POC 43.44 37.7 - 47.9 %   MCV 91.1 80 - 97 fL   MCH, POC 29.6 27 - 31.2 pg   MCHC 32.5 31.8 - 35.4 g/dL   RDW, POC 12.6 %   Platelet Count, POC 289 142 - 424 K/uL   MPV 8.2 0 - 99.8 fL  POCT glycosylated hemoglobin (Hb A1C)  Result Value Ref Range   Hemoglobin A1C 5.5   '

## 2015-05-30 NOTE — Patient Instructions (Signed)
Use the MetroGel twice daily as directed on face. If symptoms persist then see a dermatologist  Take over-the-counter acid suppressing agents on an as-needed basis for your stomach. I'll let you know the results of your labs.  We will complete the form after getting the full laboratory tests back  Annual mammogram is needed  Work hard on regular exercise and weight loss by eating less  Return annually or as needed

## 2015-05-30 NOTE — Progress Notes (Signed)
Dyspepsia and acne rosacea Subjective: Patient has intermittent problems with dyspepsia for a long time. She does not know of ever being tested for Helicobacter pylori. She also has a red nose and cheeks. She is thought about seeing a dermatologist but has not done so.  Objective: Scan on nose and cheeks appears to be acne rosacea like Her abdomen is soft with no major tenderness today  Assessment: Acne rosacea Dyspepsia  Plan: Try using MetroGel on her face before sending to a dermatologist Check H. pylori

## 2015-05-31 LAB — H. PYLORI BREATH TEST: H. pylori Breath Test: NOT DETECTED

## 2015-06-03 ENCOUNTER — Telehealth: Payer: Self-pay

## 2015-06-03 NOTE — Telephone Encounter (Signed)
Notes Recorded by Posey Boyer, MD on 06/02/2015 at 4:03 PM Please call patient. Lab results were normal.

## 2015-06-03 NOTE — Telephone Encounter (Signed)
She doesn't have results for blood test (physical for insurance). She stated Dr. Linna Darner told her they should be ready by Monday.  Please advise [phone]

## 2015-06-04 ENCOUNTER — Other Ambulatory Visit: Payer: Self-pay

## 2015-06-04 DIAGNOSIS — Z1231 Encounter for screening mammogram for malignant neoplasm of breast: Secondary | ICD-10-CM

## 2015-06-04 NOTE — Telephone Encounter (Signed)
Tricia Bowen spoke with pt yesterday.

## 2015-06-19 ENCOUNTER — Ambulatory Visit
Admission: RE | Admit: 2015-06-19 | Discharge: 2015-06-19 | Disposition: A | Discharge status: Home / Self Care | Payer: BLUE CROSS/BLUE SHIELD | Source: Ambulatory Visit | Attending: Family Medicine | Admitting: Family Medicine

## 2015-06-19 DIAGNOSIS — Z1231 Encounter for screening mammogram for malignant neoplasm of breast: Secondary | ICD-10-CM

## 2015-08-01 ENCOUNTER — Ambulatory Visit (INDEPENDENT_AMBULATORY_CARE_PROVIDER_SITE_OTHER): Payer: BLUE CROSS/BLUE SHIELD | Admitting: Family Medicine

## 2015-08-01 VITALS — BP 124/80 | HR 93 | Temp 98.4°F | Resp 18 | Ht 66.0 in | Wt 217.0 lb

## 2015-08-01 DIAGNOSIS — J069 Acute upper respiratory infection, unspecified: Secondary | ICD-10-CM | POA: Diagnosis not present

## 2015-08-01 DIAGNOSIS — J4531 Mild persistent asthma with (acute) exacerbation: Secondary | ICD-10-CM | POA: Diagnosis not present

## 2015-08-01 MED ORDER — HYDROCOD POLST-CPM POLST ER 10-8 MG/5ML PO SUER: 5.0000 mL | Freq: Every evening | ORAL | Status: DC | PRN

## 2015-08-01 MED ORDER — ALBUTEROL SULFATE (2.5 MG/3ML) 0.083% IN NEBU
2.5000 mg | INHALATION_SOLUTION | Freq: Once | RESPIRATORY_TRACT | Status: AC
  Administered 2015-08-01: 2.5 mg via RESPIRATORY_TRACT

## 2015-08-01 MED ORDER — IPRATROPIUM BROMIDE 0.02 % IN SOLN
0.5000 mg | Freq: Once | RESPIRATORY_TRACT | Status: AC
  Administered 2015-08-01: 0.5 mg via RESPIRATORY_TRACT

## 2015-08-01 MED ORDER — PREDNISONE 20 MG PO TABS: ORAL_TABLET | ORAL | Status: DC

## 2015-08-01 NOTE — Progress Notes (Signed)
Subjective:    Patient ID: Tricia Bowen, female    DOB: 1961-03-06, 54 y.o.   MRN: [id number]  Chief Complaint  Patient presents with  . Shortness of Breath    last night has been using inhaler more   . Cough    light yellow mucous over a week now  . Nasal Congestion   HPI Patient presents today for evaluation of cough, nasal congestion, and SOB. Cough started approx. 1 week ago. Initially productive with yellow-tinged sputum, but has become dry in the last day or two. Associated with nasal congestion and drainage, chest tightness, HA, upper teeth pain and achiness, as well as sinus pressure in both cheeks. Uses a saline spray twice a day with good relief of nasal congestion. Has also tried Dayquil and Nyquil. Coughing more in the last day or two d/t her SOB. Denies fever, chills, sore throat, ear pain, tinnitus, or hearing loss. No recent sick contacts.   Patient states last night she laid down to go to sleep and started feeling SOB. She used her albuterol inhaler approx. 10-20 times last night and 5-6 times this morning with good relief of her symptoms. Feeling better today, but still having some dizziness and lightheadedness.    Over the last year, she has been using her albuterol inhaler on average once or twice per day. She is prescribed Symbicort twice daily, however she has only been using it once daily and forgets to use it approx. 2-3 days/week. She has been using her Flonase nasal spray minimally over the last year, only when she really needs it. She stopped taking Singulair months ago d/t feeling anxious on it.   No other concerns on today's visit.   Review of Systems  Constitutional: Positive for chills. Negative for fever, diaphoresis and fatigue.  HENT: Positive for congestion (nasal), postnasal drip, rhinorrhea (yellow-tinged drainage) and sinus pressure (over both cheeks). Negative for ear discharge, ear pain, facial swelling, hearing loss, sore throat, tinnitus and trouble  swallowing.   Eyes: Positive for itching (chronic dry eye, nothing new). Negative for discharge and redness.  Respiratory: Positive for cough (initially productive, now dry), chest tightness and shortness of breath (since last night). Negative for wheezing.   Cardiovascular: Negative for chest pain and palpitations.  Musculoskeletal: Positive for myalgias.  Skin: Positive for color change (chronic rosacea). Negative for rash.  Allergic/Immunologic: Positive for environmental allergies.  Neurological: Positive for dizziness, light-headedness and headaches.   There are no active problems to display for this patient.  Family History  Problem Relation Age of Onset  . Alzheimer's disease Mother   . Colon polyps Mother 75  . Heart disease Father   . Heart disease Maternal Grandmother   . Heart disease Maternal Grandfather   . Alzheimer's disease Paternal Grandmother   . Heart disease Paternal Grandfather   . Colon cancer Neg Hx    Social History   Social History  . Marital Status: Married    Spouse Name: N/A  . Number of Children: N/A  . Years of Education: N/A   Occupational History  . Not on file.   Social History Main Topics  . Smoking status: Former Smoker    Types: Cigarettes    Quit date: 06/05/2008  . Smokeless tobacco: Never Used  . Alcohol Use: 1.8 oz/week    3 Glasses of wine per week  . Drug Use: No  . Sexual Activity: Yes    Birth Control/ Protection: None   Other Topics Concern  .  Not on file   Social History Narrative   Prior to Admission medications   Medication Sig Start Date End Date Taking? Authorizing Provider  albuterol (PROAIR HFA) 108 (90 BASE) MCG/ACT inhaler Inhale 2 puffs into the lungs every 6 (six) hours as needed. 05/30/15  Yes Posey Boyer, MD  budesonide-formoterol [facility]) 160-4.5 MCG/ACT inhaler Inhale 2 puffs into the lungs 2 (two) times daily. 05/30/15  Yes Posey Boyer, MD  diphenhydrAMINE (BENADRYL) 25 MG tablet Take 25 mg by  mouth every 6 (six) hours as needed.   Yes Historical Provider, MD  fluticasone (FLONASE) 50 MCG/ACT nasal spray Place 2 sprays into both nostrils daily.   Yes Historical Provider, MD  ibuprofen (ADVIL,MOTRIN) 600 MG tablet Take 1 tablet (600 mg total) by mouth 3 (three) times daily. 08/08/14  Yes Carmin Muskrat, MD  metroNIDAZOLE (METROGEL) 0.75 % gel Apply 1 application topically 2 (two) times daily. 05/30/15  Yes Posey Boyer, MD  diazepam (VALIUM) 5 MG tablet Take 1 tablet (5 mg total) by mouth 2 (two) times daily. Patient not taking: Reported on 05/30/2015 08/08/14   Carmin Muskrat, MD  fexofenadine (ALLEGRA) 180 MG tablet Take 180 mg by mouth daily as needed for allergies.     Historical Provider, MD  montelukast (SINGULAIR) 10 MG tablet Take 1 tablet (10 mg total) by mouth at bedtime. Patient not taking: Reported on 05/30/2015 01/14/13   Robyn Haber, MD   Allergies  Allergen Reactions  . Penicillins Anaphylaxis      Objective:   Physical Exam  Constitutional: She is oriented to person, place, and time. She appears well-developed and well-nourished. No distress.  BP 124/80 mmHg  Pulse 93  Temp(Src) 98.4 F (36.9 C) (Oral)  Resp 18  Ht 5\' 6"  (1.676 m)  Wt 217 lb (98.431 kg)  BMI 35.04 kg/m2  SpO2 96%  HENT:  Head: Normocephalic and atraumatic.  Right Ear: External ear normal.  Left Ear: External ear normal.  Mouth/Throat: Oropharynx is clear and moist. No oropharyngeal exudate.  Mild clear drainage from both nostrils  Eyes: EOM are normal. Right eye exhibits no discharge. Left eye exhibits no discharge. No scleral icterus.  Neck: Normal range of motion. Neck supple. No thyromegaly present.  Cardiovascular: Normal rate, regular rhythm, normal heart sounds and intact distal pulses.  Exam reveals no gallop and no friction rub.   No murmur heard. Pulmonary/Chest: Effort normal. No respiratory distress. She has no rales. She exhibits no tenderness.  Breath sounds mildly  coarse, but no rales. Good air movement throughout both lung fields. Peak flow meter performed x 3, highest reading measured at 250 L/min, or 54% of predicted peak flow rate.   Lymphadenopathy:    She has no cervical adenopathy.  Neurological: She is alert and oriented to person, place, and time.  Skin: Skin is warm and dry. No rash noted. She is not diaphoretic. No erythema.  Psychiatric: She has a normal mood and affect. Her behavior is normal. Judgment and thought content normal.   Nebulizer treatment administered in office. After treatment, patient had subjective improvement of her symptoms. Her lung fields are CTABL, no wheezes or rales. Highest peak flow meter reading measured at 340 L/min, or 74% of predicted peak flow rate.    Assessment & Plan:  1. Asthma with acute exacerbation, mild persistent - albuterol (PROVENTIL) (2.5 MG/3ML) 0.083% nebulizer solution 2.5 mg; Take 3 mLs (2.5 mg total) by nebulization once. - ipratropium (ATROVENT) nebulizer solution 0.5 mg; Take 2.5  mLs (0.5 mg total) by nebulization once. - predniSONE (DELTASONE) 20 MG tablet; Take 3 tabs x 3 days, then 2 x 3 days, then 1 x 3 days  Dispense: 18 tablet; Refill: 0  2. Acute upper respiratory infection - chlorpheniramine-HYDROcodone (TUSSIONEX PENNKINETIC ER) 10-8 MG/5ML SUER; Take 5 mLs by mouth at bedtime as needed for cough.  Dispense: 70 mL; Refill: 0 - predniSONE (DELTASONE) 20 MG tablet; Take 3 tabs x 3 days, then 2 x 3 days, then 1 x 3 days  Dispense: 18 tablet; Refill: 0

## 2015-08-01 NOTE — Patient Instructions (Signed)
Take symbicort and flonase daily For nasal congestion can add Afrin twice a day for 3 days, sudafed twice a day (generic fine for both) Drink enough water to have light yellow urine Return if you have increased shortness of breath, fever over 101 for more than 24 hours

## 2015-08-01 NOTE — Progress Notes (Signed)
Subjective:    Patient ID: Tricia Bowen, female    DOB: [birth year], 54 y.o.   MRN: [id number]  HPI Patient presents today for evaluation of cough, nasal congestion, and SOB. Cough started approx. 1 week ago. Initially productive with yellow-tinged sputum, but has become dry in the last day or two. Associated with nasal congestion and drainage, chest tightness, HA, upper teeth pain and achiness, as well as sinus pressure in both cheeks. Uses a saline spray twice a day with good relief of nasal congestion. Has also tried Dayquil and Nyquil. Coughing more in the last day or two d/t her SOB. Denies fever, chills, sore throat, ear pain, tinnitus, or hearing loss. No recent sick contacts.   Patient states last night she laid down to go to sleep and started feeling SOB. She used her albuterol inhaler approx. 10-20 times last night and 5-6 times this morning with good relief of her symptoms. Feeling better today, but still having some dizziness and lightheadedness.    Over the last year, she has been using her albuterol inhaler on average once or twice per day. She is prescribed Symbicort twice daily, however she has only been using it once daily and forgets to use it approx. 2-3 days/week. She has been using her Flonase nasal spray minimally over the last year, only when she really needs it. She stopped taking Singulair months ago d/t feeling anxious on it.   She has not had frequent exacerbations or infections in several years. Has not been on prednisone for many years.   Past Medical History  Diagnosis Date  . Allergy   . Anemia   . Asthma   . Arthritis     neck  . GERD (gastroesophageal reflux disease)    Past Surgical History  Procedure Laterality Date  . Tubal ligation  1985  . Cyst removal leg Right 1979    foot  . Tonsillectomy  1980  . Lipoma excision Left 2011    hip   Family History  Problem Relation Age of Onset  . Alzheimer's disease Mother   . Colon polyps Mother 59  . Heart  disease Father   . Heart disease Maternal Grandmother   . Heart disease Maternal Grandfather   . Alzheimer's disease Paternal Grandmother   . Heart disease Paternal Grandfather   . Colon cancer Neg Hx    Social History  Substance Use Topics  . Smoking status: Former Smoker    Types: Cigarettes    Quit date: 06/05/2008  . Smokeless tobacco: Never Used  . Alcohol Use: 1.8 oz/week    3 Glasses of wine per week    Review of Systems  Constitutional: Negative for fever and chills.  HENT: Positive for congestion, postnasal drip, rhinorrhea, sinus pressure and sore throat. Negative for ear discharge and ear pain.   Respiratory: Positive for cough, chest tightness and shortness of breath. Negative for wheezing.   Cardiovascular: Negative for chest pain and leg swelling.  Musculoskeletal: Positive for arthralgias.  Allergic/Immunologic: Positive for environmental allergies.  Neurological: Positive for dizziness, light-headedness and headaches.      Objective:   Physical Exam  Constitutional: She is oriented to person, place, and time. She appears well-developed and well-nourished. No distress.  HENT:  Head: Normocephalic and atraumatic.  Right Ear: External ear normal.  Left Ear: External ear normal.  Nose: Mucosal edema and rhinorrhea present. Right sinus exhibits maxillary sinus tenderness. Right sinus exhibits no frontal sinus tenderness. Left sinus exhibits maxillary sinus tenderness.  Left sinus exhibits no frontal sinus tenderness.  Mouth/Throat: Mucous membranes are normal. Posterior oropharyngeal erythema present. No oropharyngeal exudate or posterior oropharyngeal edema.  Cardiovascular: Normal rate, regular rhythm and normal heart sounds.   Pulmonary/Chest: Effort normal.  Bilateral posterior fields sound slightly coarse, no wheezing, good air movement throughout.   Musculoskeletal: Normal range of motion.  Neurological: She is alert and oriented to person, place, and time.   Skin: Skin is warm and dry. She is not diaphoretic.  Psychiatric: She has a normal mood and affect. Her behavior is normal. Judgment and thought content normal.  Vitals reviewed.  BP 124/80 mmHg  Pulse 93  Temp(Src) 98.4 F (36.9 C) (Oral)  Resp 18  Ht 5\' 6"  (1.676 m)  Wt 217 lb (98.431 kg)  BMI 35.04 kg/m2  SpO2 96% PF pre treatment- 250 (54% predicted) Patient given albuterol/atrovent nebulizer with subjective improvement of symptoms and clearing of coarse breath sounds. PF post treatment- 340 (74% predicted)     Assessment & Plan:  1. Asthma with acute exacerbation, mild persistent - likely triggered by viral URI and/or seasonal allergies - discussed need for consistent use of Symbicort and flonase and explained role in controlling asthma/allergies and need for improved control and decreased rescue inhaler use - albuterol (PROVENTIL) (2.5 MG/3ML) 0.083% nebulizer solution 2.5 mg; Take 3 mLs (2.5 mg total) by nebulization once. - ipratropium (ATROVENT) nebulizer solution 0.5 mg; Take 2.5 mLs (0.5 mg total) by nebulization once. - predniSONE (DELTASONE) 20 MG tablet; Take 3 tabs x 3 days, then 2 x 3 days, then 1 x 3 days  Dispense: 18 tablet; Refill: 0 - RTC precautions provided - follow up in 3 months, sooner as needed  2. Acute upper respiratory infection with cough - likely viral, symptomatic treatment measures encouraged- written instructions provided - chlorpheniramine-HYDROcodone (TUSSIONEX PENNKINETIC ER) 10-8 MG/5ML SUER; Take 5 mLs by mouth at bedtime as needed for cough.  Dispense: 70 mL; Refill: 0 - predniSONE (DELTASONE) 20 MG tablet; Take 3 tabs x 3 days, then 2 x 3 days, then 1 x 3 days  Dispense: 18 tablet; Refill: 0   Clarene Reamer, FNP-BC  [facility]  08/01/2015 10:14 PM

## 2015-09-03 ENCOUNTER — Ambulatory Visit (INDEPENDENT_AMBULATORY_CARE_PROVIDER_SITE_OTHER): Payer: BLUE CROSS/BLUE SHIELD | Admitting: Internal Medicine

## 2015-09-03 VITALS — BP 124/76 | HR 82 | Temp 99.0°F | Resp 17 | Ht 66.0 in | Wt 220.0 lb

## 2015-09-03 DIAGNOSIS — J4531 Mild persistent asthma with (acute) exacerbation: Secondary | ICD-10-CM

## 2015-09-03 DIAGNOSIS — J0140 Acute pansinusitis, unspecified: Secondary | ICD-10-CM

## 2015-09-03 DIAGNOSIS — J069 Acute upper respiratory infection, unspecified: Secondary | ICD-10-CM

## 2015-09-03 DIAGNOSIS — J4540 Moderate persistent asthma, uncomplicated: Secondary | ICD-10-CM

## 2015-09-03 MED ORDER — AZITHROMYCIN 500 MG PO TABS: 500.0000 mg | ORAL_TABLET | Freq: Every day | ORAL | Status: DC

## 2015-09-03 MED ORDER — HYDROCOD POLST-CPM POLST ER 10-8 MG/5ML PO SUER: 5.0000 mL | Freq: Every evening | ORAL | Status: DC | PRN

## 2015-09-03 MED ORDER — ALBUTEROL SULFATE HFA 108 (90 BASE) MCG/ACT IN AERS: 2.0000 | INHALATION_SPRAY | Freq: Four times a day (QID) | RESPIRATORY_TRACT | Status: DC | PRN

## 2015-09-03 MED ORDER — ALBUTEROL SULFATE (2.5 MG/3ML) 0.083% IN NEBU
2.5000 mg | INHALATION_SOLUTION | Freq: Once | RESPIRATORY_TRACT | Status: AC
  Administered 2015-09-03: 2.5 mg via RESPIRATORY_TRACT

## 2015-09-03 MED ORDER — IPRATROPIUM BROMIDE 0.02 % IN SOLN
0.5000 mg | Freq: Once | RESPIRATORY_TRACT | Status: AC
  Administered 2015-09-03: 0.5 mg via RESPIRATORY_TRACT

## 2015-09-03 MED ORDER — PREDNISONE 20 MG PO TABS: ORAL_TABLET | ORAL | Status: DC

## 2015-09-03 NOTE — Patient Instructions (Signed)
Prednisone tablets What is this medicine? PREDNISONE (PRED ni sone) is a corticosteroid. It is commonly used to treat inflammation of the skin, joints, lungs, and other organs. Common conditions treated include asthma, allergies, and arthritis. It is also used for other conditions, such as blood disorders and diseases of the adrenal glands. This medicine may be used for other purposes; ask your health care provider or pharmacist if you have questions. What should I tell my health care provider before I take this medicine? They need to know if you have any of these conditions: -Cushing's syndrome -diabetes -glaucoma -heart disease -high blood pressure -infection (especially a virus infection such as chickenpox, cold sores, or herpes) -kidney disease -liver disease -mental illness -myasthenia gravis -osteoporosis -seizures -stomach or intestine problems -thyroid disease -an unusual or allergic reaction to lactose, prednisone, other medicines, foods, dyes, or preservatives -pregnant or trying to get pregnant -breast-feeding How should I use this medicine? Take this medicine by mouth with a Kieara Schwark of water. Follow the directions on the prescription label. Take this medicine with food. If you are taking this medicine once a day, take it in the morning. Do not take more medicine than you are told to take. Do not suddenly stop taking your medicine because you may develop a severe reaction. Your doctor will tell you how much medicine to take. If your doctor wants you to stop the medicine, the dose may be slowly lowered over time to avoid any side effects. Talk to your pediatrician regarding the use of this medicine in children. Special care may be needed. Overdosage: If you think you have taken too much of this medicine contact a poison control center or emergency room at once. NOTE: This medicine is only for you. Do not share this medicine with others. What if I miss a dose? If you miss a dose,  take it as soon as you can. If it is almost time for your next dose, talk to your doctor or health care professional. You may need to miss a dose or take an extra dose. Do not take double or extra doses without advice. What may interact with this medicine? Do not take this medicine with any of the following medications: -metyrapone -mifepristone This medicine may also interact with the following medications: -aminoglutethimide -amphotericin B -aspirin and aspirin-like medicines -barbiturates -certain medicines for diabetes, like glipizide or glyburide -cholestyramine -cholinesterase inhibitors -cyclosporine -digoxin -diuretics -ephedrine -female hormones, like estrogens and birth control pills -isoniazid -ketoconazole -NSAIDS, medicines for pain and inflammation, like ibuprofen or naproxen -phenytoin -rifampin -toxoids -vaccines -warfarin This list may not describe all possible interactions. Give your health care provider a list of all the medicines, herbs, non-prescription drugs, or dietary supplements you use. Also tell them if you smoke, drink alcohol, or use illegal drugs. Some items may interact with your medicine. What should I watch for while using this medicine? Visit your doctor or health care professional for regular checks on your progress. If you are taking this medicine over a prolonged period, carry an identification card with your name and address, the type and dose of your medicine, and your doctor's name and address. This medicine may increase your risk of getting an infection. Tell your doctor or health care professional if you are around anyone with measles or chickenpox, or if you develop sores or blisters that do not heal properly. If you are going to have surgery, tell your doctor or health care professional that you have taken this medicine within the last   twelve months. Ask your doctor or health care professional about your diet. You may need to lower the amount  of salt you eat. This medicine may affect blood sugar levels. If you have diabetes, check with your doctor or health care professional before you change your diet or the dose of your diabetic medicine. What side effects may I notice from receiving this medicine? Side effects that you should report to your doctor or health care professional as soon as possible: -allergic reactions like skin rash, itching or hives, swelling of the face, lips, or tongue -changes in emotions or moods -changes in vision -depressed mood -eye pain -fever or chills, cough, sore throat, pain or difficulty passing urine -increased thirst -swelling of ankles, feet Side effects that usually do not require medical attention (report to your doctor or health care professional if they continue or are bothersome): -confusion, excitement, restlessness -headache -nausea, vomiting -skin problems, acne, thin and shiny skin -trouble sleeping -weight gain This list may not describe all possible side effects. Call your doctor for medical advice about side effects. You may report side effects to FDA at 1-800-FDA-1088. Where should I keep my medicine? Keep out of the reach of children. Store at room temperature between 15 and 30 degrees C (59 and 86 degrees F). Protect from light. Keep container tightly closed. Throw away any unused medicine after the expiration date. NOTE: This sheet is a summary. It may not cover all possible information. If you have questions about this medicine, talk to your doctor, pharmacist, or health care provider.    2016, Elsevier/Gold Standard. (2011-05-07 10:57:14) Sinusitis, Adult Sinusitis is redness, soreness, and inflammation of the paranasal sinuses. Paranasal sinuses are air pockets within the bones of your face. They are located beneath your eyes, in the middle of your forehead, and above your eyes. In healthy paranasal sinuses, mucus is able to drain out, and air is able to circulate through  them by way of your nose. However, when your paranasal sinuses are inflamed, mucus and air can become trapped. This can allow bacteria and other germs to grow and cause infection. Sinusitis can develop quickly and last only a short time (acute) or continue over a long period (chronic). Sinusitis that lasts for more than 12 weeks is considered chronic. CAUSES Causes of sinusitis include:  Allergies.  Structural abnormalities, such as displacement of the cartilage that separates your nostrils (deviated septum), which can decrease the air flow through your nose and sinuses and affect sinus drainage.  Functional abnormalities, such as when the small hairs (cilia) that line your sinuses and help remove mucus do not work properly or are not present. SIGNS AND SYMPTOMS Symptoms of acute and chronic sinusitis are the same. The primary symptoms are pain and pressure around the affected sinuses. Other symptoms include:  Upper toothache.  Earache.  Headache.  Bad breath.  Decreased sense of smell and taste.  A cough, which worsens when you are lying flat.  Fatigue.  Fever.  Thick drainage from your nose, which often is green and may contain pus (purulent).  Swelling and warmth over the affected sinuses. DIAGNOSIS Your health care provider will perform a physical exam. During your exam, your health care provider may perform any of the following to help determine if you have acute sinusitis or chronic sinusitis:  Look in your nose for signs of abnormal growths in your nostrils (nasal polyps).  Tap over the affected sinus to check for signs of infection.  View the inside of  your sinuses using an imaging device that has a light attached (endoscope). If your health care provider suspects that you have chronic sinusitis, one or more of the following tests may be recommended:  Allergy tests.  Nasal culture. A sample of mucus is taken from your nose, sent to a lab, and screened for  bacteria.  Nasal cytology. A sample of mucus is taken from your nose and examined by your health care provider to determine if your sinusitis is related to an allergy. TREATMENT Most cases of acute sinusitis are related to a viral infection and will resolve on their own within 10 days. Sometimes, medicines are prescribed to help relieve symptoms of both acute and chronic sinusitis. These may include pain medicines, decongestants, nasal steroid sprays, or saline sprays. However, for sinusitis related to a bacterial infection, your health care provider will prescribe antibiotic medicines. These are medicines that will help kill the bacteria causing the infection. Rarely, sinusitis is caused by a fungal infection. In these cases, your health care provider will prescribe antifungal medicine. For some cases of chronic sinusitis, surgery is needed. Generally, these are cases in which sinusitis recurs more than 3 times per year, despite other treatments. HOME CARE INSTRUCTIONS  Drink plenty of water. Water helps thin the mucus so your sinuses can drain more easily.  Use a humidifier.  Inhale steam 3-4 times a day (for example, sit in the bathroom with the shower running).  Apply a warm, moist washcloth to your face 3-4 times a day, or as directed by your health care provider.  Use saline nasal sprays to help moisten and clean your sinuses.  Take medicines only as directed by your health care provider.  If you were prescribed either an antibiotic or antifungal medicine, finish it all even if you start to feel better. SEEK IMMEDIATE MEDICAL CARE IF:  You have increasing pain or severe headaches.  You have nausea, vomiting, or drowsiness.  You have swelling around your face.  You have vision problems.  You have a stiff neck.  You have difficulty breathing.   This information is not intended to replace advice given to you by your health care provider. Make sure you discuss any questions you  have with your health care provider.   Document Released: 09/21/2005 Document Revised: 10/12/2014 Document Reviewed: 10/06/2011 Elsevier Interactive Patient Education [employer].

## 2015-09-03 NOTE — Progress Notes (Signed)
Patient ID: Tricia Bowen, female   DOB: 30-Dec-1960, 54 y.o.   MRN: [id number]   09/03/2015 at 1:39 PM  Tricia Bowen / DOB: 1961/01/16 / MRN: [id number]  Problem list reviewed and updated by me where necessary.   SUBJECTIVE  Tricia Bowen is a 54 y.o. ill appearing female presenting for the chief complaint of sob and asthma..She also has upper teeth pain and foul nasal discharge , she thinks she has sinus infection. She was here 3 weeks ago for asthma attack and prednisone worked.     She  has a past medical history of Allergy; Anemia; Asthma; Arthritis; and GERD (gastroesophageal reflux disease).    Medications reviewed and updated by myself where necessary, and exist elsewhere in the encounter.   [name] is allergic to penicillins. She  reports that she quit smoking about 7 years ago. Her smoking use included Cigarettes. She has never used smokeless tobacco. She reports that she drinks about 1.8 oz of alcohol per week. She reports that she does not use illicit drugs. She  reports that she currently engages in sexual activity. She reports using the following method of birth control/protection: None. The patient  has past surgical history that includes Tubal ligation (1985); Cyst removal leg (Right, 1979); Tonsillectomy (1980); and Lipoma excision (Left, 2011).  Her family history includes Alzheimer's disease in her mother and paternal grandmother; Colon polyps (age of onset: 74) in her mother; Heart disease in her father, maternal grandfather, maternal grandmother, and paternal grandfather. There is no history of Colon cancer.  Review of Systems  Constitutional: Positive for malaise/fatigue. Negative for fever and chills.  HENT: Positive for nosebleeds. Negative for sore throat.   Respiratory: Positive for cough, shortness of breath and wheezing. Negative for hemoptysis and sputum production.   Cardiovascular: Negative.  Negative for chest pain.  Gastrointestinal: Negative for nausea.   Musculoskeletal: Negative.   Skin: Negative for rash.  Neurological: Negative.  Negative for dizziness and headaches.    OBJECTIVE  Her  height is 5\' 6"  (1.676 m) and weight is 220 lb (99.791 kg). Her oral temperature is 99 F (37.2 C). Her blood pressure is 124/76 and her pulse is 82. Her respiration is 17 and oxygen saturation is 94%.  The patient's body mass index is 35.53 kg/(m^2).  Physical Exam  Constitutional: She is oriented to person, place, and time. She appears well-developed and well-nourished. She appears distressed.  HENT:  Head: Normocephalic.    Right Ear: External ear normal.  Left Ear: External ear normal.  Nose: Mucosal edema, rhinorrhea and sinus tenderness present. Epistaxis is observed. Right sinus exhibits maxillary sinus tenderness. Right sinus exhibits no frontal sinus tenderness. Left sinus exhibits maxillary sinus tenderness. Left sinus exhibits no frontal sinus tenderness.  Mouth/Throat: Oropharynx is clear and moist.  Eyes: Conjunctivae are normal. No scleral icterus.  Neck: Normal range of motion.  Cardiovascular: Normal rate.   Respiratory: She is in respiratory distress. She has wheezes. She has no rales. She exhibits no tenderness.  GI: She exhibits no distension.  Musculoskeletal: Normal range of motion.  Neurological: She is alert and oriented to person, place, and time. Coordination normal.  Skin: Skin is warm and dry.  Psychiatric: She has a normal mood and affect.   PFR 380 prior to Nebulizer Post Neb PFR 430 L/min No results found for this or any previous visit (from the past 24 hour(s)).  ASSESSMENT & PLAN  [name] was seen today for asthma and shortness of breath.  Diagnoses  and all orders for this visit:  Asthma with acute exacerbation, mild persistent -     albuterol (PROVENTIL) (2.5 MG/3ML) 0.083% nebulizer solution 2.5 mg; Take 3 mLs (2.5 mg total) by nebulization once. -     ipratropium (ATROVENT) nebulizer solution 0.5 mg; Take  2.5 mLs (0.5 mg total) by nebulization once. -     predniSONE (DELTASONE) 20 MG tablet; Take 3 tabs x 3 days, then 2 x 3 days, then 1 x 3 days -     chlorpheniramine-HYDROcodone (TUSSIONEX PENNKINETIC ER) 10-8 MG/5ML SUER; Take 5 mLs by mouth at bedtime as needed for cough.  Acute upper respiratory infection -     predniSONE (DELTASONE) 20 MG tablet; Take 3 tabs x 3 days, then 2 x 3 days, then 1 x 3 days -     chlorpheniramine-HYDROcodone (TUSSIONEX PENNKINETIC ER) 10-8 MG/5ML SUER; Take 5 mLs by mouth at bedtime as needed for cough.  Asthma, chronic, moderate persistent, uncomplicated -     albuterol (PROAIR HFA) 108 (90 BASE) MCG/ACT inhaler; Inhale 2 puffs into the lungs every 6 (six) hours as needed.  Acute pansinusitis, recurrence not specified -     azithromycin (ZITHROMAX) 500 MG tablet; Take 1 tablet (500 mg total) by mouth daily. -     chlorpheniramine-HYDROcodone (TUSSIONEX PENNKINETIC ER) 10-8 MG/5ML SUER; Take 5 mLs by mouth at bedtime as needed for cough.

## 2015-12-03 ENCOUNTER — Ambulatory Visit: Payer: BLUE CROSS/BLUE SHIELD | Admitting: Family Medicine

## 2015-12-03 ENCOUNTER — Ambulatory Visit (INDEPENDENT_AMBULATORY_CARE_PROVIDER_SITE_OTHER): Payer: BLUE CROSS/BLUE SHIELD | Admitting: Physician Assistant

## 2015-12-03 VITALS — BP 120/80 | HR 102 | Temp 98.8°F | Resp 20 | Ht 66.0 in | Wt 208.2 lb

## 2015-12-03 DIAGNOSIS — J0100 Acute maxillary sinusitis, unspecified: Secondary | ICD-10-CM | POA: Diagnosis not present

## 2015-12-03 DIAGNOSIS — J069 Acute upper respiratory infection, unspecified: Secondary | ICD-10-CM | POA: Diagnosis not present

## 2015-12-03 MED ORDER — DOXYCYCLINE HYCLATE 100 MG PO TABS: 100.0000 mg | ORAL_TABLET | Freq: Two times a day (BID) | ORAL | Status: DC

## 2015-12-03 MED ORDER — GUAIFENESIN ER 1200 MG PO TB12: 1.0000 | ORAL_TABLET | Freq: Two times a day (BID) | ORAL | Status: AC

## 2015-12-03 MED ORDER — HYDROCOD POLST-CPM POLST ER 10-8 MG/5ML PO SUER: 5.0000 mL | Freq: Every evening | ORAL | Status: DC | PRN

## 2015-12-03 NOTE — Progress Notes (Signed)
Tricia Bowen  MRN: [id number] DOB: [birth year]  Subjective:  Pt presents to clinic with 1 week with sinus pressure that is not getting better - she started with a headache about a week ago and she thought that it would go away but then she kept getting worsening symptoms. She was using nasal rinse without anything coming out but then 3 days ago she started getting out thick hard green stuff.  She is having more pressure in her max sinus and teeth are starting to hurt.  She has allergies and she has been using her zyrtec but not her nasal spray.  She had some left over tussionex that has helped with her cough.  There are no active problems to display for this patient.   Current Outpatient Prescriptions on File Prior to Visit  Medication Sig Dispense Refill  . albuterol (PROAIR HFA) 108 (90 BASE) MCG/ACT inhaler Inhale 2 puffs into the lungs every 6 (six) hours as needed. 1 Inhaler 12  . budesonide-formoterol (SYMBICORT) 160-4.5 MCG/ACT inhaler Inhale 2 puffs into the lungs 2 (two) times daily. 1 Inhaler 12  . diphenhydrAMINE (BENADRYL) 25 MG tablet Take 25 mg by mouth every 6 (six) hours as needed.    . fluticasone (FLONASE) 50 MCG/ACT nasal spray Place 2 sprays into both nostrils daily.    Marland Kitchen ibuprofen (ADVIL,MOTRIN) 600 MG tablet Take 1 tablet (600 mg total) by mouth 3 (three) times daily. 15 tablet 0  . metroNIDAZOLE (METROGEL) 0.75 % gel Apply 1 application topically 2 (two) times daily. 45 g 3  . montelukast (SINGULAIR) 10 MG tablet Take 1 tablet (10 mg total) by mouth at bedtime. 90 tablet 3   No current facility-administered medications on file prior to visit.    Allergies  Allergen Reactions  . Penicillins Anaphylaxis    Review of Systems  Constitutional: Negative for fever and chills.  HENT: Positive for congestion, postnasal drip, rhinorrhea (green) and sinus pressure (bilateral max).   Respiratory: Positive for cough (dry). Negative for shortness of breath and wheezing.    Neurological: Positive for headaches.   Objective:  BP 120/80 mmHg  Pulse 102  Temp(Src) 98.8 F (37.1 C) (Oral)  Resp 20  Ht 5\' 6"  (1.676 m)  Wt 208 lb 3.2 oz (94.439 kg)  BMI 33.62 kg/m2  SpO2 95%  Physical Exam  Constitutional: She is oriented to person, place, and time and well-developed, well-nourished, and in no distress.  HENT:  Head: Normocephalic and atraumatic.  Right Ear: Hearing, tympanic membrane, external ear and ear canal normal.  Left Ear: Hearing, tympanic membrane, external ear and ear canal normal.  Nose: Right sinus exhibits maxillary sinus tenderness. Left sinus exhibits maxillary sinus tenderness.  Mouth/Throat: Uvula is midline, oropharynx is clear and moist and mucous membranes are normal.  Eyes: Conjunctivae are normal.  Neck: Normal range of motion.  Cardiovascular: Normal rate, regular rhythm and normal heart sounds.   No murmur heard. Pulmonary/Chest: Effort normal and breath sounds normal.  Neurological: She is alert and oriented to person, place, and time. Gait normal.  Skin: Skin is warm and dry.  Psychiatric: Mood, memory, affect and judgment normal.  Vitals reviewed.   Assessment and Plan :  Acute maxillary sinusitis, recurrence not specified - Plan: doxycycline (VIBRA-TABS) 100 MG tablet, Guaifenesin (MUCINEX MAXIMUM STRENGTH) 1200 MG TB12, Care order/instruction  Acute upper respiratory infection - Plan: chlorpheniramine-HYDROcodone (TUSSIONEX PENNKINETIC ER) 10-8 MG/5ML SUER   Symptomatic care - pt to continue her nasal washes - she should add  her nasal spray.  Windell Hummingbird PA-C  [facility] 12/03/2015 6:54 PM

## 2015-12-03 NOTE — Patient Instructions (Signed)
Please push fluids.  Tylenol and Motrin for fever and body aches.    

## 2015-12-04 ENCOUNTER — Ambulatory Visit: Payer: BLUE CROSS/BLUE SHIELD | Admitting: Family Medicine

## 2015-12-08 ENCOUNTER — Ambulatory Visit (INDEPENDENT_AMBULATORY_CARE_PROVIDER_SITE_OTHER): Payer: BLUE CROSS/BLUE SHIELD | Admitting: Family Medicine

## 2015-12-08 VITALS — BP 118/82 | HR 80 | Temp 98.5°F | Ht 66.0 in | Wt 208.0 lb

## 2015-12-08 DIAGNOSIS — H6982 Other specified disorders of Eustachian tube, left ear: Secondary | ICD-10-CM

## 2015-12-08 DIAGNOSIS — J0101 Acute recurrent maxillary sinusitis: Secondary | ICD-10-CM

## 2015-12-08 NOTE — Progress Notes (Signed)
Subjective:    Patient ID: Tricia Bowen, female    DOB: 05-19-1961, 55 y.o.   MRN: [id number]  12/08/2015  Otalgia   HPI This 55 y.o. female presents for B ears clogged. Onset five days ago.  L ear is clogged.  Presented for sinus infection five days ago; was coughing a lot on day of presentation.  Peroxided ear yesterday.  No fever/chills/sweats.  +HA sinus is improved.  No ear pain.  Can hear through L ear but feels congested and feels like something is in ear.  Decrease in rhinorrhea and congestion; sinuses are improving.  +mild cough.  No sore throat.  Taking Zyrtec, Doxycycline, Tussionex PRN.    Review of Systems  Constitutional: Negative for fever, chills, diaphoresis and fatigue.  HENT: Positive for congestion, hearing loss, postnasal drip, rhinorrhea, sinus pressure and voice change. Negative for ear discharge, ear pain, sneezing, sore throat and trouble swallowing.   Eyes: Negative for visual disturbance.  Respiratory: Negative for cough and shortness of breath.   Cardiovascular: Negative for chest pain, palpitations and leg swelling.  Gastrointestinal: Negative for nausea, vomiting, abdominal pain, diarrhea and constipation.  Endocrine: Negative for cold intolerance, heat intolerance, polydipsia, polyphagia and polyuria.  Neurological: Negative for dizziness, tremors, seizures, syncope, facial asymmetry, speech difficulty, weakness, light-headedness, numbness and headaches.    Past Medical History  Diagnosis Date  . Allergy   . Anemia   . Asthma   . Arthritis     neck  . GERD (gastroesophageal reflux disease)    Past Surgical History  Procedure Laterality Date  . Tubal ligation  1985  . Cyst removal leg Right 1979    foot  . Tonsillectomy  1980  . Lipoma excision Left 2011    hip   Allergies  Allergen Reactions  . Penicillins Anaphylaxis   Current Outpatient Prescriptions  Medication Sig Dispense Refill  . albuterol (PROAIR HFA) 108 (90 BASE) MCG/ACT inhaler  Inhale 2 puffs into the lungs every 6 (six) hours as needed. 1 Inhaler 12  . budesonide-formoterol (SYMBICORT) 160-4.5 MCG/ACT inhaler Inhale 2 puffs into the lungs 2 (two) times daily. 1 Inhaler 12  . cetirizine (ZYRTEC) 10 MG tablet Take 10 mg by mouth daily.    . chlorpheniramine-HYDROcodone (TUSSIONEX PENNKINETIC ER) 10-8 MG/5ML SUER Take 5 mLs by mouth at bedtime as needed for cough. 70 mL 0  . diphenhydrAMINE (BENADRYL) 25 MG tablet Take 25 mg by mouth every 6 (six) hours as needed.    . doxycycline (VIBRA-TABS) 100 MG tablet Take 1 tablet (100 mg total) by mouth 2 (two) times daily. 20 tablet 0  . fluticasone (FLONASE) 50 MCG/ACT nasal spray Place 2 sprays into both nostrils daily.    . Guaifenesin (MUCINEX MAXIMUM STRENGTH) 1200 MG TB12 Take 1 tablet (1,200 mg total) by mouth 2 (two) times daily. 14 each 0  . ibuprofen (ADVIL,MOTRIN) 600 MG tablet Take 1 tablet (600 mg total) by mouth 3 (three) times daily. 15 tablet 0  . metroNIDAZOLE (METROGEL) 0.75 % gel Apply 1 application topically 2 (two) times daily. 45 g 3   No current facility-administered medications for this visit.   Social History   Social History  . Marital Status: Married    Spouse Name: N/A  . Number of Children: N/A  . Years of Education: N/A   Occupational History  . Not on file.   Social History Main Topics  . Smoking status: Former Smoker    Types: Cigarettes    Quit date:  06/05/2008  . Smokeless tobacco: Never Used  . Alcohol Use: 1.8 oz/week    3 Glasses of wine per week  . Drug Use: No  . Sexual Activity: Yes    Birth Control/ Protection: None   Other Topics Concern  . Not on file   Social History Narrative   Family History  Problem Relation Age of Onset  . Alzheimer's disease Mother   . Colon polyps Mother 60  . Heart disease Father   . Heart disease Maternal Grandmother   . Heart disease Maternal Grandfather   . Alzheimer's disease Paternal Grandmother   . Heart disease Paternal  Grandfather   . Colon cancer Neg Hx        Objective:    BP 118/82 mmHg  Pulse 80  Temp(Src) 98.5 F (36.9 C) (Oral)  Ht 5\' 6"  (1.676 m)  Wt 208 lb (94.348 kg)  BMI 33.59 kg/m2  SpO2 98% Physical Exam  Constitutional: She is oriented to person, place, and time. She appears well-developed and well-nourished. No distress.  HENT:  Head: Normocephalic and atraumatic.  Right Ear: External ear and ear canal normal. Tympanic membrane is erythematous and retracted. Tympanic membrane is not perforated and not bulging.  Left Ear: Tympanic membrane, external ear and ear canal normal.  Nose: Mucosal edema and rhinorrhea present. Right sinus exhibits maxillary sinus tenderness. Right sinus exhibits no frontal sinus tenderness. Left sinus exhibits maxillary sinus tenderness. Left sinus exhibits no frontal sinus tenderness.  Mouth/Throat: Uvula is midline, oropharynx is clear and moist and mucous membranes are normal.  +L TM pearly gray inferior aspect; superior aspect of TM dull and retracted and mildly erythematous; no perforation.  Eyes: Conjunctivae and EOM are normal. Pupils are equal, round, and reactive to light.  Neck: Normal range of motion. Neck supple. Carotid bruit is not present. No thyromegaly present.  Cardiovascular: Normal rate, regular rhythm, normal heart sounds and intact distal pulses.  Exam reveals no gallop and no friction rub.   No murmur heard. Pulmonary/Chest: Effort normal and breath sounds normal. She has no wheezes. She has no rales.  Abdominal: Soft. Bowel sounds are normal. She exhibits no distension and no mass. There is no tenderness. There is no rebound and no guarding.  Lymphadenopathy:    She has no cervical adenopathy.  Neurological: She is alert and oriented to person, place, and time. No cranial nerve deficit.  Skin: Skin is warm and dry. No rash noted. She is not diaphoretic. No erythema. No pallor.  Psychiatric: She has a normal mood and affect. Her  behavior is normal.        Assessment & Plan:   1. Eustachian tube dysfunction, left   2. Acute recurrent maxillary sinusitis    -New. -reassurance provided. -Add Flonase; pt reluctant to take Pseudoephedrine. -continue Zyrtec and Doxycycline.   No orders of the defined types were placed in this encounter.   No orders of the defined types were placed in this encounter.    No Follow-up on file.    Jaeli Grubb Elayne Guerin, M.D. [facility] [address] [place], [place]  [address] [phone] phone [phone] fax

## 2015-12-08 NOTE — Patient Instructions (Signed)

## 2015-12-11 ENCOUNTER — Ambulatory Visit (INDEPENDENT_AMBULATORY_CARE_PROVIDER_SITE_OTHER): Payer: BLUE CROSS/BLUE SHIELD | Admitting: Family Medicine

## 2015-12-11 ENCOUNTER — Encounter: Payer: Self-pay | Admitting: Family Medicine

## 2015-12-11 VITALS — BP 114/74 | HR 91 | Temp 98.6°F | Resp 18 | Wt 212.2 lb

## 2015-12-11 DIAGNOSIS — J0100 Acute maxillary sinusitis, unspecified: Secondary | ICD-10-CM

## 2015-12-11 DIAGNOSIS — J4540 Moderate persistent asthma, uncomplicated: Secondary | ICD-10-CM | POA: Diagnosis not present

## 2015-12-11 DIAGNOSIS — H6982 Other specified disorders of Eustachian tube, left ear: Secondary | ICD-10-CM

## 2015-12-11 NOTE — Progress Notes (Signed)
   Subjective:    Patient ID: Tricia Bowen, female    DOB: 01/23/1961, 55 y.o.   MRN: [id number]  HPI This is a pleasant 55 yo female who presents today for follow up of asthma and recent sinus infection/eustachian tube disorder. She is taking Symbicort daily and has noticeable decrease in rescue inhaler use (1-2 times per week). She is on doxycycline and has a couple of days remaining. She has noticed improvement, but continues to have sinus pressure, ear pressure and runny nose. She was afraid to take sudafed and afrin. She is using flonase twice daily and has used Mucinex with some relief.   Past Medical History  Diagnosis Date  . Allergy   . Anemia   . Asthma   . Arthritis     neck  . GERD (gastroesophageal reflux disease)    Past Surgical History  Procedure Laterality Date  . Tubal ligation  1985  . Cyst removal leg Right 1979    foot  . Tonsillectomy  1980  . Lipoma excision Left 2011    hip   Family History  Problem Relation Age of Onset  . Alzheimer's disease Mother   . Colon polyps Mother 65  . Heart disease Father   . Heart disease Maternal Grandmother   . Heart disease Maternal Grandfather   . Alzheimer's disease Paternal Grandmother   . Heart disease Paternal Grandfather   . Colon cancer Neg Hx    Social History  Substance Use Topics  . Smoking status: Former Smoker    Types: Cigarettes    Quit date: 06/05/2008  . Smokeless tobacco: Never Used  . Alcohol Use: 1.8 oz/week    3 Glasses of wine per week    Review of Systems  HENT: Positive for ear pain (pressure), postnasal drip, rhinorrhea and sinus pressure.   Respiratory: Positive for cough (occasional, non productive). Negative for shortness of breath and wheezing.   Neurological: Negative for headaches.       Objective:   Physical Exam  Constitutional: She is oriented to person, place, and time. She appears well-developed and well-nourished.  HENT:  Head: Normocephalic and atraumatic.  Right Ear:  External ear normal.  Left Ear: External ear normal.  Eyes: Conjunctivae are normal.  Neck: Normal range of motion. Neck supple.  Cardiovascular: Normal rate, regular rhythm and normal heart sounds.   Pulmonary/Chest: Effort normal and breath sounds normal.  Neurological: She is alert and oriented to person, place, and time.  Skin: Skin is warm and dry.  Psychiatric: She has a normal mood and affect. Her behavior is normal. Judgment and thought content normal.  Vitals reviewed.    BP 114/74 mmHg  Pulse 91  Temp(Src) 98.6 F (37 C) (Oral)  Resp 18  Wt 212 lb 3.2 oz (96.253 kg)  SpO2 94%  PF- 450 (predicted 461), this is improved from 340 on 08/01/15    Assessment & Plan:  1. Asthma, chronic, moderate persistent, uncomplicated - improved subjectively and with peak flow measures on Symbicort - continue daily Symbicort  2. Eustachian tube dysfunction, left - reassured patient and encouraged her to read the information that was provided to her at time of diagnosis - discussed short term Sudafed and Afrin use for ear/sinus symptoms  3. Acute maxillary sinusitis, recurrence not specified - finish antibiotic, add daily antihistamine   Clarene Reamer, FNP-BC  [facility]  12/11/2015 10:17 AM

## 2015-12-11 NOTE — Progress Notes (Signed)
Subjective:    Patient ID: Tricia Bowen, female    DOB: 1961-09-17, 55 y.o.   MRN: [id number]  HPI  This is a pleasant 55 year old pt that came in today for a check up of her asthma medications. Patient reports that she uses her rescue inhaler a lot less then she was before- last time was a few days ago. She reports that she believes that she is still having a sinus infection and is about to ran out of abx. She also states that her left ear seems clogged and her teeth are hurting. Reports yellow to green nasal drainage.    Past Medical History  Diagnosis Date  . Allergy   . Anemia   . Asthma   . Arthritis     neck  . GERD (gastroesophageal reflux disease)    Family History  Problem Relation Age of Onset  . Alzheimer's disease Mother   . Colon polyps Mother 76  . Heart disease Father   . Heart disease Maternal Grandmother   . Heart disease Maternal Grandfather   . Alzheimer's disease Paternal Grandmother   . Heart disease Paternal Grandfather   . Colon cancer Neg Hx    . Social History   Social History  . Marital Status: Married    Spouse Name: N/A  . Number of Children: N/A  . Years of Education: N/A   Occupational History  . Not on file.   Social History Main Topics  . Smoking status: Former Smoker    Types: Cigarettes    Quit date: 06/05/2008  . Smokeless tobacco: Never Used  . Alcohol Use: 1.8 oz/week    3 Glasses of wine per week  . Drug Use: No  . Sexual Activity: Yes    Birth Control/ Protection: None   Other Topics Concern  . Not on file   Social History Narrative     Review of Systems  Constitutional: Negative for activity change, appetite change and fatigue.  HENT: Positive for congestion, dental problem (teeth hurting ), ear pain (left ear pressure), rhinorrhea (green-yellow) and sinus pressure (front). Negative for sneezing and sore throat.   Respiratory: Positive for cough (small, yellow). Negative for chest tightness and shortness of breath.    Cardiovascular: Negative for chest pain.  Gastrointestinal: Negative for vomiting and diarrhea.  Neurological: Negative for dizziness and weakness.       Objective:   Physical Exam  Constitutional: She is oriented to person, place, and time. She appears well-developed and well-nourished.  HENT:  Head: Normocephalic.  Left Ear: There is swelling (effusion). Decreased hearing is noted.  Neck: Normal range of motion. No thyromegaly present.  Cardiovascular: Normal rate, regular rhythm and normal heart sounds.   Pulmonary/Chest: Effort normal and breath sounds normal. No respiratory distress. She has no wheezes. She exhibits no tenderness.  Neurological: She is alert and oriented to person, place, and time.  Skin: Skin is warm and dry.  Psychiatric: She has a normal mood and affect. Her behavior is normal. Judgment and thought content normal.     BP 114/74 mmHg  Pulse 91  Temp(Src) 98.6 F (37 C) (Oral)  Resp 18  Wt 212 lb 3.2 oz (96.253 kg)  SpO2 94%     Assessment & Plan:  1. Asthma -continue albuterol inhaler as needed -increase activity slowly - use daily Symbicort   2. Sinus infection -continue abx -continue flonase daily -may use sudafed as needed -may start Afrin for 3 days, twice a day -  resume mucinex  Steffanie Dunn, FNP-STUDENT  [facility]  12/11/2015 10:02 AM

## 2015-12-11 NOTE — Patient Instructions (Addendum)
Continue Flonase daily May use Afrin for up to 3 days, twice a day May use regular Sudafed, 1 in the morning and 1 after lunch  May resume Mucinex Drink plenty of water  Get plenty of rest Please call or go on MYchart to schedule an appointment for your physical

## 2016-06-14 ENCOUNTER — Other Ambulatory Visit: Payer: Self-pay | Admitting: Family Medicine

## 2016-06-14 DIAGNOSIS — J4540 Moderate persistent asthma, uncomplicated: Secondary | ICD-10-CM

## 2016-09-09 ENCOUNTER — Ambulatory Visit (INDEPENDENT_AMBULATORY_CARE_PROVIDER_SITE_OTHER): Payer: BLUE CROSS/BLUE SHIELD | Admitting: Urgent Care

## 2016-09-09 VITALS — BP 126/80 | HR 70 | Temp 98.8°F | Resp 17 | Ht 66.5 in | Wt 215.0 lb

## 2016-09-09 DIAGNOSIS — L299 Pruritus, unspecified: Secondary | ICD-10-CM | POA: Diagnosis not present

## 2016-09-09 DIAGNOSIS — J4540 Moderate persistent asthma, uncomplicated: Secondary | ICD-10-CM

## 2016-09-09 DIAGNOSIS — H578 Other specified disorders of eye and adnexa: Secondary | ICD-10-CM

## 2016-09-09 DIAGNOSIS — H5789 Other specified disorders of eye and adnexa: Secondary | ICD-10-CM

## 2016-09-09 DIAGNOSIS — Z23 Encounter for immunization: Secondary | ICD-10-CM | POA: Diagnosis not present

## 2016-09-09 MED ORDER — AZELASTINE HCL 0.05 % OP SOLN: 1.0000 [drp] | Freq: Two times a day (BID) | OPHTHALMIC | 12 refills | Status: DC

## 2016-09-09 MED ORDER — ALBUTEROL SULFATE HFA 108 (90 BASE) MCG/ACT IN AERS: 2.0000 | INHALATION_SPRAY | Freq: Four times a day (QID) | RESPIRATORY_TRACT | 12 refills | Status: DC | PRN

## 2016-09-09 NOTE — Progress Notes (Signed)
    MRN: [id number] DOB: 03-30-1961  Subjective:   Tricia Bowen is a 55 y.o. female presenting for chief complaint of Eye Pain; Immunizations (flu ); and Medication Refill (albuterol)  Eye pain - Reports 1 day history of left eye itching. Also has dull eye pain, rated 2/10, constant. Has had clear drainage. Has tried Zyrtec, benadryl without any relief of her symptoms. Denies fever, matted eye, photosensitivity, trauma, foreign body sensation. Has had to clean a new home with a lot dust. Has a history of allergies, uses antihistamines as above with good results.  Asthma - Has needed to use her albuterol inhaler once daily due to the dust in the air of the new home she has been helping her daughter clean. Denies chest pain, shob, wheezing. Denies smoking cigarettes. Would like a refill of her albuterol inhaler.  Tricia Bowen has a current medication list which includes the following prescription(s): albuterol, cetirizine, diphenhydramine, fluticasone, ibuprofen, metronidazole, and symbicort. Also is allergic to penicillins.  Tricia Bowen  has a past medical history of Allergy; Anemia; Arthritis; Asthma; and GERD (gastroesophageal reflux disease). Also  has a past surgical history that includes Tubal ligation (1985); Cyst removal leg (Right, 1979); Tonsillectomy (1980); and Lipoma excision (Left, 2011).   Objective:   Vitals: BP 126/80 (BP Location: Right Arm, Patient Position: Sitting, Cuff Size: Large)   Pulse 70   Temp 98.8 F (37.1 C) (Oral)   Resp 17   Ht 5' 6.5" (1.689 m)   Wt 215 lb (97.5 kg)   SpO2 96%   BMI 34.18 kg/m   Physical Exam  Constitutional: She is oriented to person, place, and time. She appears well-developed and well-nourished.  HENT:  TM's intact bilaterally, no effusions or erythema. Nasal turbinates pink and moist, nasal passages patent. No sinus tenderness. Oropharynx clear, mucous membranes moist, dentition in good repair.  Eyes: EOM are normal. Pupils are equal, round, and  reactive to light. Right eye exhibits no discharge. Left eye exhibits no discharge.  Cardiovascular: Normal rate, regular rhythm and intact distal pulses.  Exam reveals no gallop and no friction rub.   No murmur heard. Pulmonary/Chest: No respiratory distress. She has no wheezes. She has no rales.  Neurological: She is alert and oriented to person, place, and time.  Skin: Skin is warm and dry.   Eye Exam: Eyelids everted and swept for foreign body. The left eye was stained with fluorescein. Examination under woods lamp does not reveal a foreign body or area of increased stain uptake. The eye was then irrigated copiously with saline.   Assessment and Plan :   1. Redness of left eye 2. Itching - Likely having allergic conjunctivitis.  - If no improvement, worsening symptoms or lack of resolution of symptoms return to clinic.   3. Asthma, chronic, moderate persistent, uncomplicated - Stable, refill provided.  4. Need for prophylactic vaccination and inoculation against influenza - Flu Vaccine QUAD 36+ mos IM  5. Serum phosphate elevated - Patient would like work up of elevated phosphate level as seen with her labs through a standard check up with her employer. I recommended she set up a follow up appointment to perform work up for this.  Jaynee Eagles, PA-C [facility] [phone] 09/09/2016 9:53 AM

## 2016-09-09 NOTE — Patient Instructions (Addendum)
Allergic Conjunctivitis, Adult Allergic conjunctivitis is inflammation of the clear membrane that covers the white part of your eye and the inner surface of your eyelid (conjunctiva). The inflammation is caused by allergies. The blood vessels in the conjunctiva become inflamed and this causes the eyes to become red or pink. The eyes often feel itchy. Allergic conjunctivitis cannot be spread from one person to another person (is not contagious). What are the causes? This condition is caused by an allergic reaction. Common causes of an allergic reaction (allergens) include:  Outdoor allergens, such as:  Pollen.  Grass and weeds.  Mold spores.  Indoor allergens, such as:  Dust.  Smoke.  Mold.  Pet dander.  Animal hair. What increases the risk? You may be more likely to develop this condition if you have a family history of allergies, such as:  Allergic rhinitis.  Bronchial asthma.  Atopic dermatitis. What are the signs or symptoms? Symptoms of this condition include eyes that are:  Itchy.  Red.  Watery.  Puffy. Your eyes may also:  Sting or burn.  Have clear drainage coming from them. How is this diagnosed? This condition may be diagnosed by medical history and physical exam. If you have drainage from your eyes, it may be tested to rule out other causes of conjunctivitis. You may also need to see a health care provider who specializes in treating allergies (allergist) or eye conditions (ophthalmologist) for tests to confirm the diagnosis. You may have:  Skin tests to see which allergens are causing your symptoms. These tests involve pricking the skin with a tiny needle and exposing the skin to small amounts of potential allergens to see if your skin reacts.  Blood tests.  Tissue scrapings from your eyelid. These will be examined under a microscope. How is this treated? Treatments for this condition may include:  Cold cloths (compresses) to soothe itching and  swelling.  Washing the face to remove allergens.  Eye drops. These may be prescription or over-the-counter. There are several different types. You may need to try different types to see which one works best for you. Your may need:  Eye drops that block the allergic reaction (antihistamine).  Eye drops that reduce swelling and irritation (anti-inflammatory).  Steroid eye drops to lessen a severe reaction (vernal conjunctivitis).  Oral antihistamine medicines to reduce your allergic reaction. You may need these if eye drops do not help or are difficult to use. Follow these instructions at home:  Avoid known allergens whenever possible.  Take or apply over-the-counter and prescription medicines only as told by your health care provider. These include any eye drops.  Apply a cool, clean washcloth to your eye for 10-20 minutes, 3-4 times a day.  Do not touch or rub your eyes.  Do not wear contact lenses until the inflammation is gone. Wear glasses instead.  Do not wear eye makeup until the inflammation is gone.  Keep all follow-up visits as told by your health care provider. This is important. Contact a health care provider if:  Your symptoms get worse or do not improve with treatment.  You have mild eye pain.  You have sensitivity to light.  You have spots or blisters on your eyes.  You have pus draining from your eye.  You have a fever. Get help right away if:  You have redness, swelling, or other symptoms in only one eye.  Your vision is blurred or you have vision changes.  You have severe eye pain. This information is   not intended to replace advice given to you by your health care provider. Make sure you discuss any questions you have with your health care provider. Document Released: 12/12/2002 Document Revised: 05/20/2016 Document Reviewed: 04/03/2016 Elsevier Interactive Patient Education  2017 [place].    Asthma, Adult Asthma is a recurring condition in  which the airways tighten and narrow. Asthma can make it difficult to breathe. It can cause coughing, wheezing, and shortness of breath. Asthma episodes, also called asthma attacks, range from minor to life-threatening. Asthma cannot be cured, but medicines and lifestyle changes can help control it. What are the causes? Asthma is believed to be caused by inherited (genetic) and environmental factors, but its exact cause is unknown. Asthma may be triggered by allergens, lung infections, or irritants in the air. Asthma triggers are different for each person. Common triggers include:  Animal dander.  Dust mites.  Cockroaches.  Pollen from trees or grass.  Mold.  Smoke.  Air pollutants such as dust, household cleaners, hair sprays, aerosol sprays, paint fumes, strong chemicals, or strong odors.  Cold air, weather changes, and winds (which increase molds and pollens in the air).  Strong emotional expressions such as crying or laughing hard.  Stress.  Certain medicines (such as aspirin) or types of drugs (such as beta-blockers).  Sulfites in foods and drinks. Foods and drinks that may contain sulfites include dried fruit, potato chips, and sparkling grape juice.  Infections or inflammatory conditions such as the flu, a cold, or an inflammation of the nasal membranes (rhinitis).  Gastroesophageal reflux disease (GERD).  Exercise or strenuous activity. What are the signs or symptoms? Symptoms may occur immediately after asthma is triggered or many hours later. Symptoms include:  Wheezing.  Excessive nighttime or early morning coughing.  Frequent or severe coughing with a common cold.  Chest tightness.  Shortness of breath. How is this diagnosed? The diagnosis of asthma is made by a review of your medical history and a physical exam. Tests may also be performed. These may include:  Lung function studies. These tests show how much air you breathe in and out.  Allergy  tests.  Imaging tests such as X-rays. How is this treated? Asthma cannot be cured, but it can usually be controlled. Treatment involves identifying and avoiding your asthma triggers. It also involves medicines. There are 2 classes of medicine used for asthma treatment:  Controller medicines. These prevent asthma symptoms from occurring. They are usually taken every day.  Reliever or rescue medicines. These quickly relieve asthma symptoms. They are used as needed and provide short-term relief. Your health care provider will help you create an asthma action plan. An asthma action plan is a written plan for managing and treating your asthma attacks. It includes a list of your asthma triggers and how they may be avoided. It also includes information on when medicines should be taken and when their dosage should be changed. An action plan may also involve the use of a device called a peak flow meter. A peak flow meter measures how well the lungs are working. It helps you monitor your condition. Follow these instructions at home:  Take medicines only as directed by your health care provider. Speak with your health care provider if you have questions about how or when to take the medicines.  Use a peak flow meter as directed by your health care provider. Record and keep track of readings.  Understand and use the action plan to help minimize or stop an  asthma attack without needing to seek medical care.  Control your home environment in the following ways to help prevent asthma attacks:  Do not smoke. Avoid being exposed to secondhand smoke.  Change your heating and air conditioning filter regularly.  Limit your use of fireplaces and wood stoves.  Get rid of pests (such as roaches and mice) and their droppings.  Throw away plants if you see mold on them.  Clean your floors and dust regularly. Use unscented cleaning products.  Try to have someone else vacuum for you regularly. Stay out of  rooms while they are being vacuumed and for a short while afterward. If you vacuum, use a dust mask from a hardware store, a double-layered or microfilter vacuum cleaner bag, or a vacuum cleaner with a HEPA filter.  Replace carpet with wood, tile, or vinyl flooring. Carpet can trap dander and dust.  Use allergy-proof pillows, mattress covers, and box spring covers.  Wash bed sheets and blankets every week in hot water and dry them in a dryer.  Use blankets that are made of polyester or cotton.  Clean bathrooms and kitchens with bleach. If possible, have someone repaint the walls in these rooms with mold-resistant paint. Keep out of the rooms that are being cleaned and painted.  Wash hands frequently. Contact a health care provider if:  You have wheezing, shortness of breath, or a cough even if taking medicine to prevent attacks.  The colored mucus you cough up (sputum) is thicker than usual.  Your sputum changes from clear or white to yellow, green, gray, or bloody.  You have any problems that may be related to the medicines you are taking (such as a rash, itching, swelling, or trouble breathing).  You are using a reliever medicine more than 2-3 times per week.  Your peak flow is still at 50-79% of your personal best after following your action plan for 1 hour.  You have a fever. Get help right away if:  You seem to be getting worse and are unresponsive to treatment during an asthma attack.  You are short of breath even at rest.  You get short of breath when doing very little physical activity.  You have difficulty eating, drinking, or talking due to asthma symptoms.  You develop chest pain.  You develop a fast heartbeat.  You have a bluish color to your lips or fingernails.  You are light-headed, dizzy, or faint.  Your peak flow is less than 50% of your personal best. This information is not intended to replace advice given to you by your health care provider. Make  sure you discuss any questions you have with your health care provider. Document Released: 09/21/2005 Document Revised: 03/04/2016 Document Reviewed: 04/20/2013 Elsevier Interactive Patient Education  2017 [employer].   IF you received an x-ray today, you will receive an invoice from [facility]. Please contact [facility] at [phone] with questions or concerns regarding your invoice.   IF you received labwork today, you will receive an invoice from [employer]. Please contact [facility] at [phone] with questions or concerns regarding your invoice.   Our billing staff will not be able to assist you with questions regarding bills from these companies.  You will be contacted with the lab results as soon as they are available. The fastest way to get your results is to activate your My Chart account. Instructions are located on the last page of this paperwork. If you have not heard from [place] regarding  the results in 2 weeks, please contact this office.

## 2016-10-01 ENCOUNTER — Ambulatory Visit: Payer: BLUE CROSS/BLUE SHIELD | Admitting: Urgent Care

## 2017-02-06 ENCOUNTER — Ambulatory Visit (INDEPENDENT_AMBULATORY_CARE_PROVIDER_SITE_OTHER): Payer: BLUE CROSS/BLUE SHIELD | Admitting: Urgent Care

## 2017-02-06 ENCOUNTER — Encounter: Payer: Self-pay | Admitting: Urgent Care

## 2017-02-06 VITALS — BP 123/85 | HR 82 | Temp 98.5°F | Resp 14 | Ht 66.0 in | Wt 212.0 lb

## 2017-02-06 DIAGNOSIS — J3089 Other allergic rhinitis: Secondary | ICD-10-CM | POA: Diagnosis not present

## 2017-02-06 DIAGNOSIS — J4540 Moderate persistent asthma, uncomplicated: Secondary | ICD-10-CM | POA: Diagnosis not present

## 2017-02-06 DIAGNOSIS — J0100 Acute maxillary sinusitis, unspecified: Secondary | ICD-10-CM

## 2017-02-06 MED ORDER — DOXYCYCLINE HYCLATE 100 MG PO CAPS: 100.0000 mg | ORAL_CAPSULE | Freq: Two times a day (BID) | ORAL | 0 refills | Status: DC

## 2017-02-06 NOTE — Progress Notes (Signed)
   MRN: [id number] DOB: 12-Sep-1961  Subjective:   Tricia Bowen is a 56 y.o. female presenting for chief complaint of Asthma (allergies flared now asthma flare also )  Reports 1 week history of worsening sinus congestion, sinus pain, sneezing, productive cough, mild shob and wheezing, scratchy and very itchy throat. Has tried Symbicort twice daily, albuterol inhaler using twice daily, Zyrtec, benadryl. Denies fever, ear pain, chest pain, n/v, changes in abdominal pain. Denies smoking cigarettes.  Tricia Bowen has a current medication list which includes the following prescription(s): albuterol, cetirizine, diphenhydramine, fluticasone, ibuprofen, metronidazole, symbicort, and azelastine. Also is allergic to penicillins. Tricia Bowen  has a past medical history of Allergy; Anemia; Arthritis; Asthma; and GERD (gastroesophageal reflux disease). Also  has a past surgical history that includes Tubal ligation (1985); Cyst removal leg (Right, 1979); Tonsillectomy (1980); and Lipoma excision (Left, 2011).  Objective:   Vitals: BP 123/85 (Cuff Size: Large)   Pulse 82   Temp 98.5 F (36.9 C)   Resp 14   Ht 5\' 6"  (1.676 m)   Wt 212 lb (96.2 kg)   SpO2 95%   BMI 34.22 kg/m   Physical Exam  Constitutional: She is oriented to person, place, and time. She appears well-developed and well-nourished.  HENT:  TM's flat but intact bilaterally, no effusions or erythema. Nasal turbinates boggy with slight erythema, nasal passages minimally patent. Left-sided maxillary sinus tenderness. Significant post-nasal drainage, mucous membranes moist.  Eyes: Right eye exhibits no discharge. Left eye exhibits no discharge.  Neck: Normal range of motion. Neck supple.  Cardiovascular: Normal rate, regular rhythm and intact distal pulses.  Exam reveals no gallop and no friction rub.   No murmur heard. Pulmonary/Chest: No respiratory distress. She has no wheezes. She has no rales.  Lymphadenopathy:    She has no cervical adenopathy.   Neurological: She is alert and oriented to person, place, and time.  Skin: Skin is warm and dry.  Psychiatric: She has a normal mood and affect.   Assessment and Plan :   1. Seasonal allergic rhinitis due to other allergic trigger 2. Acute non-recurrent maxillary sinusitis - Maintain Zyrtec, stop Flonase until secondary sinusitis is resolved. Patient declined Singulair. Start doxycycline and rtc if no improvement. - doxycycline (VIBRAMYCIN) 100 MG capsule; Take 1 capsule (100 mg total) by mouth 2 (two) times daily.  Dispense: 20 capsule; Refill: 0  3. Moderate persistent extrinsic asthma without complication - Maintain albuterol, Symbicort.   Jaynee Eagles, PA-C [facility] [phone] 02/06/2017  12:16 PM

## 2017-02-06 NOTE — Patient Instructions (Addendum)
- Once your symptoms are under control you should continue the Flonase nasal spray for at least for the duration of your allergy season.  - For appropriate administration of the nasal spray, clear the nose, use opposite hand for opposite nare, sniff gently, exhale through your mouth. - For maximal effect take these two nasal sprays at least 30 minutes apart. - Continue Allegra, Claritin or Zyrtec each day, as needed. - Drink at least 64 ounces of water each day. - If you have a humidifier use it nightly. - Remove as many irritants/allergies as you are able to, no pets in the bedroom, change air filters in air vents.    Allergies, Adult An allergy is when your body's defense system (immune system) overreacts to an otherwise harmless substance (allergen) that you breathe in or eat or something that touches your skin. When you come into contact with something that you are allergic to, your immune system produces certain proteins (antibodies). These proteins cause cells to release chemicals (histamines) that trigger the symptoms of an allergic reaction. Allergies often affect the nasal passages (allergic rhinitis), eyes (allergic conjunctivitis), skin (atopic dermatitis), and stomach. Allergies can be mild or severe. Allergies cannot spread from person to person (are not contagious). They can develop at any age and may be outgrown. What are the causes? Allergies can be caused by any substance that your immune system mistakenly targets as harmful. These may include:  Outdoor allergens, such as pollen, grass, weeds, car exhaust, and mold spores.  Indoor allergens, such as dust, smoke, mold, and pet dander.  Foods, especially peanuts, milk, eggs, fish, shellfish, soy, nuts, and wheat.  Medicines, such as penicillin.  Skin irritants, such as detergents, chemicals, and latex.  Perfume.  Insect bites or stings. What increases the risk? You may be at greater risk of allergies if other people in  your family have allergies. What are the signs or symptoms? Symptoms depend on what type of allergy you have. They may include:  Runny, stuffy nose.  Sneezing.  Itchy mouth, ears, or throat.  Postnasal drip.  Sore throat.  Itchy, red, watery, or puffy eyes.  Skin rash or hives.  Stomach pain.  Vomiting.  Diarrhea.  Bloating.  Wheezing or coughing. People with a severe allergy to food, medicine, or an insect bite may have a life-threatening allergic reaction (anaphylaxis). Symptoms of anaphylaxis include:  Hives.  Itching.  Flushed face.  Swollen lips, tongue, or mouth.  Tight or swollen throat.  Chest pain or tightness in the chest.  Trouble breathing or shortness of breath.  Rapid heartbeat.  Dizziness or fainting.  Vomiting.  Diarrhea.  Pain in the abdomen. How is this diagnosed? This condition is diagnosed based on:  Your symptoms.  Your family and medical history.  A physical exam. You may need to see a health care provider who specializes in treating allergies (allergist). You may also have tests, including:  Skin tests to see which allergens are causing your symptoms, such as:  Skin prick test. In this test, your skin is pricked with a tiny needle and exposed to small amounts of possible allergens to see if your skin reacts.  Intradermal skin test. In this test, a small amount of allergen is injected under your skin to see if your skin reacts.  Patch test. In this test, a small amount of allergen is placed on your skin and then your skin is covered with a bandage. Your health care provider will check your skin after a  couple of days to see if a rash has developed.  Blood tests.  Challenges tests. In this test, you inhale a small amount of allergen by mouth to see if you have an allergic reaction. You may also be asked to:  Keep a food diary. A food diary is a record of all the foods and drinks you have in a day and any symptoms you  experience.  Practice an elimination diet. An elimination diet involves eliminating specific foods from your diet and then adding them back in one by one to find out if a certain food causes an allergic reaction. How is this treated? Treatment for allergies depends on your symptoms. Treatment may include:  Cold compresses to soothe itching and swelling.  Eye drops.  Nasal sprays.  Using a saline spray or container (neti pot) to flush out the nose (nasal irrigation). These methods can help clear away mucus and keep the nasal passages moist.  Using a humidifier.  Oral antihistamines or other medicines to block allergic reaction and inflammation.  Skin creams to treat rashes or itching.  Diet changes to eliminate food allergy triggers.  Repeated exposure to tiny amounts of allergens to build up a tolerance and prevent future allergic reactions (immunotherapy). These include:  Allergy shots.  Oral treatment. This involves taking small doses of an allergen under the tongue (sublingual immunotherapy).  Emergency epinephrine injection (auto-injector) in case of an allergic emergency. This is a self-injectable, pre-measured medicine that must be given within the first few minutes of a serious allergic reaction. Follow these instructions at home:  Avoid known allergens whenever possible.  If you suffer from airborne allergens, wash out your nose daily. You can do this with a saline spray or a neti pot to flush out your nose (nasal irrigation).  Take over-the-counter and prescription medicines only as told by your health care provider.  Keep all follow-up visits as told by your health care provider. This is important.  If you are at risk of a severe allergic reaction (anaphylaxis), keep your auto-injector with you at all times.  If you have ever had anaphylaxis, wear a medical alert bracelet or necklace that states you have a severe allergy. Contact a health care provider if:  Your  symptoms do not improve with treatment. Get help right away if:  You have symptoms of anaphylaxis, such as:  Swollen mouth, tongue, or throat.  Pain or tightness in your chest.  Trouble breathing or shortness of breath.  Dizziness or fainting.  Severe abdominal pain, vomiting, or diarrhea. This information is not intended to replace advice given to you by your health care provider. Make sure you discuss any questions you have with your health care provider. Document Released: 12/15/2002 Document Revised: 05/21/2016 Document Reviewed: 04/08/2016 Elsevier Interactive Patient Education  2017 [employer]    Sinusitis, Adult Sinusitis is soreness and inflammation of your sinuses. Sinuses are hollow spaces in the bones around your face. Your sinuses are located:  Around your eyes.  In the middle of your forehead.  Behind your nose.  In your cheekbones. Your sinuses and nasal passages are lined with a stringy fluid (mucus). Mucus normally drains out of your sinuses. When your nasal tissues become inflamed or swollen, the mucus can become trapped or blocked so air cannot flow through your sinuses. This allows bacteria, viruses, and funguses to grow, which leads to infection. Sinusitis can develop quickly and last for 7?10 days (acute) or for more than 12 weeks (chronic). Sinusitis often  develops after a cold. What are the causes? This condition is caused by anything that creates swelling in the sinuses or stops mucus from draining, including:  Allergies.  Asthma.  Bacterial or viral infection.  Abnormally shaped bones between the nasal passages.  Nasal growths that contain mucus (nasal polyps).  Narrow sinus openings.  Pollutants, such as chemicals or irritants in the air.  A foreign object stuck in the nose.  A fungal infection. This is rare. What increases the risk? The following factors may make you more likely to develop this condition:  Having allergies or  asthma.  Having had a recent cold or respiratory tract infection.  Having structural deformities or blockages in your nose or sinuses.  Having a weak immune system.  Doing a lot of swimming or diving.  Overusing nasal sprays.  Smoking. What are the signs or symptoms? The main symptoms of this condition are pain and a feeling of pressure around the affected sinuses. Other symptoms include:  Upper toothache.  Earache.  Headache.  Bad breath.  Decreased sense of smell and taste.  A cough that may get worse at night.  Fatigue.  Fever.  Thick drainage from your nose. The drainage is often green and it may contain pus (purulent).  Stuffy nose or congestion.  Postnasal drip. This is when extra mucus collects in the throat or back of the nose.  Swelling and warmth over the affected sinuses.  Sore throat.  Sensitivity to light. How is this diagnosed? This condition is diagnosed based on symptoms, a medical history, and a physical exam. To find out if your condition is acute or chronic, your health care provider may:  Look in your nose for signs of nasal polyps.  Tap over the affected sinus to check for signs of infection.  View the inside of your sinuses using an imaging device that has a light attached (endoscope). If your health care provider suspects that you have chronic sinusitis, you may also:  Be tested for allergies.  Have a sample of mucus taken from your nose (nasal culture) and checked for bacteria.  Have a mucus sample examined to see if your sinusitis is related to an allergy. If your sinusitis does not respond to treatment and it lasts longer than 8 weeks, you may have an MRI or CT scan to check your sinuses. These scans also help to determine how severe your infection is. In rare cases, a bone biopsy may be done to rule out more serious types of fungal sinus disease. How is this treated? Treatment for sinusitis depends on the cause and whether your  condition is chronic or acute. If a virus is causing your sinusitis, your symptoms will go away on their own within 10 days. You may be given medicines to relieve your symptoms, including:  Topical nasal decongestants. They shrink swollen nasal passages and let mucus drain from your sinuses.  Antihistamines. These drugs block inflammation that is triggered by allergies. This can help to ease swelling in your nose and sinuses.  Topical nasal corticosteroids. These are nasal sprays that ease inflammation and swelling in your nose and sinuses.  Nasal saline washes. These rinses can help to get rid of thick mucus in your nose. If your condition is caused by bacteria, you will be given an antibiotic medicine. If your condition is caused by a fungus, you will be given an antifungal medicine. Surgery may be needed to correct underlying conditions, such as narrow nasal passages. Surgery may also be  needed to remove polyps. Follow these instructions at home: Medicines   Take, use, or apply over-the-counter and prescription medicines only as told by your health care provider. These may include nasal sprays.  If you were prescribed an antibiotic medicine, take it as told by your health care provider. Do not stop taking the antibiotic even if you start to feel better. Hydrate and Humidify   Drink enough water to keep your urine clear or pale yellow. Staying hydrated will help to thin your mucus.  Use a cool mist humidifier to keep the humidity level in your home above 50%.  Inhale steam for 10-15 minutes, 3-4 times a day or as told by your health care provider. You can do this in the bathroom while a hot shower is running.  Limit your exposure to cool or dry air. Rest   Rest as much as possible.  Sleep with your head raised (elevated).  Make sure to get enough sleep each night. General instructions   Apply a warm, moist washcloth to your face 3-4 times a day or as told by your health care  provider. This will help with discomfort.  Wash your hands often with soap and water to reduce your exposure to viruses and other germs. If soap and water are not available, use hand sanitizer.  Do not smoke. Avoid being around people who are smoking (secondhand smoke).  Keep all follow-up visits as told by your health care provider. This is important. Contact a health care provider if:  You have a fever.  Your symptoms get worse.  Your symptoms do not improve within 10 days. Get help right away if:  You have a severe headache.  You have persistent vomiting.  You have pain or swelling around your face or eyes.  You have vision problems.  You develop confusion.  Your neck is stiff.  You have trouble breathing. This information is not intended to replace advice given to you by your health care provider. Make sure you discuss any questions you have with your health care provider. Document Released: 09/21/2005 Document Revised: 05/17/2016 Document Reviewed: 07/17/2015 Elsevier Interactive Patient Education  2017 [employer] [place].     Asthma, Adult Asthma is a recurring condition in which the airways tighten and narrow. Asthma can make it difficult to breathe. It can cause coughing, wheezing, and shortness of breath. Asthma episodes, also called asthma attacks, range from minor to life-threatening. Asthma cannot be cured, but medicines and lifestyle changes can help control it. What are the causes? Asthma is believed to be caused by inherited (genetic) and environmental factors, but its exact cause is unknown. Asthma may be triggered by allergens, lung infections, or irritants in the air. Asthma triggers are different for each person. Common triggers include:  Animal dander.  Dust mites.  Cockroaches.  Pollen from trees or grass.  Mold.  Smoke.  Air pollutants such as dust, household cleaners, hair sprays, aerosol sprays, paint fumes, strong chemicals, or strong  odors.  Cold air, weather changes, and winds (which increase molds and pollens in the air).  Strong emotional expressions such as crying or laughing hard.  Stress.  Certain medicines (such as aspirin) or types of drugs (such as beta-blockers).  Sulfites in foods and drinks. Foods and drinks that may contain sulfites include dried fruit, potato chips, and sparkling grape juice.  Infections or inflammatory conditions such as the flu, a cold, or an inflammation of the nasal membranes (rhinitis).  Gastroesophageal reflux disease (GERD).  Exercise or  strenuous activity. What are the signs or symptoms? Symptoms may occur immediately after asthma is triggered or many hours later. Symptoms include:  Wheezing.  Excessive nighttime or early morning coughing.  Frequent or severe coughing with a common cold.  Chest tightness.  Shortness of breath. How is this diagnosed? The diagnosis of asthma is made by a review of your medical history and a physical exam. Tests may also be performed. These may include:  Lung function studies. These tests show how much air you breathe in and out.  Allergy tests.  Imaging tests such as X-rays. How is this treated? Asthma cannot be cured, but it can usually be controlled. Treatment involves identifying and avoiding your asthma triggers. It also involves medicines. There are 2 classes of medicine used for asthma treatment:  Controller medicines. These prevent asthma symptoms from occurring. They are usually taken every day.  Reliever or rescue medicines. These quickly relieve asthma symptoms. They are used as needed and provide short-term relief. Your health care provider will help you create an asthma action plan. An asthma action plan is a written plan for managing and treating your asthma attacks. It includes a list of your asthma triggers and how they may be avoided. It also includes information on when medicines should be taken and when their dosage  should be changed. An action plan may also involve the use of a device called a peak flow meter. A peak flow meter measures how well the lungs are working. It helps you monitor your condition. Follow these instructions at home:  Take medicines only as directed by your health care provider. Speak with your health care provider if you have questions about how or when to take the medicines.  Use a peak flow meter as directed by your health care provider. Record and keep track of readings.  Understand and use the action plan to help minimize or stop an asthma attack without needing to seek medical care.  Control your home environment in the following ways to help prevent asthma attacks:  Do not smoke. Avoid being exposed to secondhand smoke.  Change your heating and air conditioning filter regularly.  Limit your use of fireplaces and wood stoves.  Get rid of pests (such as roaches and mice) and their droppings.  Throw away plants if you see mold on them.  Clean your floors and dust regularly. Use unscented cleaning products.  Try to have someone else vacuum for you regularly. Stay out of rooms while they are being vacuumed and for a short while afterward. If you vacuum, use a dust mask from a hardware store, a double-layered or microfilter vacuum cleaner bag, or a vacuum cleaner with a HEPA filter.  Replace carpet with wood, tile, or vinyl flooring. Carpet can trap dander and dust.  Use allergy-proof pillows, mattress covers, and box spring covers.  Wash bed sheets and blankets every week in hot water and dry them in a dryer.  Use blankets that are made of polyester or cotton.  Clean bathrooms and kitchens with bleach. If possible, have someone repaint the walls in these rooms with mold-resistant paint. Keep out of the rooms that are being cleaned and painted.  Wash hands frequently. Contact a health care provider if:  You have wheezing, shortness of breath, or a cough even if  taking medicine to prevent attacks.  The colored mucus you cough up (sputum) is thicker than usual.  Your sputum changes from clear or white to yellow, green, gray, or bloody.  You have any problems that may be related to the medicines you are taking (such as a rash, itching, swelling, or trouble breathing).  You are using a reliever medicine more than 2-3 times per week.  Your peak flow is still at 50-79% of your personal best after following your action plan for 1 hour.  You have a fever. Get help right away if:  You seem to be getting worse and are unresponsive to treatment during an asthma attack.  You are short of breath even at rest.  You get short of breath when doing very little physical activity.  You have difficulty eating, drinking, or talking due to asthma symptoms.  You develop chest pain.  You develop a fast heartbeat.  You have a bluish color to your lips or fingernails.  You are light-headed, dizzy, or faint.  Your peak flow is less than 50% of your personal best. This information is not intended to replace advice given to you by your health care provider. Make sure you discuss any questions you have with your health care provider. Document Released: 09/21/2005 Document Revised: 03/04/2016 Document Reviewed: 04/20/2013 Elsevier Interactive Patient Education  2017 [employer].     IF you received an x-ray today, you will receive an invoice from [facility]. Please contact [facility] at [phone] with questions or concerns regarding your invoice.   IF you received labwork today, you will receive an invoice from [facility]. Please contact [facility] at [phone] with questions or concerns regarding your invoice.   Our billing staff will not be able to assist you with questions regarding bills from these companies.  You will be contacted with the lab results as soon as they are available. The fastest way to get your results is to  activate your My Chart account. Instructions are located on the last page of this paperwork. If you have not heard from [place] regarding the results in 2 weeks, please contact this office.

## 2017-02-15 ENCOUNTER — Other Ambulatory Visit: Payer: Self-pay | Admitting: Physician Assistant

## 2017-02-15 DIAGNOSIS — J4540 Moderate persistent asthma, uncomplicated: Secondary | ICD-10-CM

## 2017-03-16 ENCOUNTER — Encounter: Payer: Self-pay | Admitting: *Deleted

## 2017-04-28 LAB — BASIC METABOLIC PANEL
BUN: 15 (ref 4–21)
CREATININE: 0.8 (ref <=1.1)
Glucose: 88

## 2017-04-28 LAB — LIPID PANEL
CHOLESTEROL: 201 — AB (ref 0–200)
HDL: 59 (ref 35–70)
LDL CALC: 112
TRIGLYCERIDES: 148 (ref 40–160)

## 2017-04-28 LAB — CBC AND DIFFERENTIAL
HCT: 41 (ref 36–46)
Hemoglobin: 13.7 (ref 12.0–16.0)
PLATELETS: 288 (ref 150–399)

## 2017-04-28 LAB — HEPATIC FUNCTION PANEL
ALK PHOS: 61 (ref 25–125)
ALT: 27 (ref 7–35)
AST: 20 (ref 13–35)
Bilirubin, Total: 0.5

## 2017-04-28 LAB — TSH: TSH: 3.52 (ref <=5.90)

## 2017-04-28 LAB — HEMOGLOBIN A1C: Hemoglobin A1C: 5.3

## 2017-04-29 ENCOUNTER — Encounter: Payer: Self-pay | Admitting: Urgent Care

## 2017-04-29 ENCOUNTER — Ambulatory Visit (INDEPENDENT_AMBULATORY_CARE_PROVIDER_SITE_OTHER): Payer: BLUE CROSS/BLUE SHIELD | Admitting: Urgent Care

## 2017-04-29 VITALS — BP 122/80 | HR 69 | Temp 98.1°F | Resp 18 | Ht 66.0 in | Wt 214.0 lb

## 2017-04-29 DIAGNOSIS — R030 Elevated blood-pressure reading, without diagnosis of hypertension: Secondary | ICD-10-CM

## 2017-04-29 DIAGNOSIS — L719 Rosacea, unspecified: Secondary | ICD-10-CM | POA: Diagnosis not present

## 2017-04-29 MED ORDER — METRONIDAZOLE 0.75 % EX GEL: 1.0000 "application " | Freq: Two times a day (BID) | CUTANEOUS | 3 refills | Status: DC

## 2017-04-29 NOTE — Patient Instructions (Addendum)
Hypertension Hypertension, commonly called high blood pressure, is when the force of blood pumping through the arteries is too strong. The arteries are the blood vessels that carry blood from the heart throughout the body. Hypertension forces the heart to work harder to pump blood and may cause arteries to become narrow or stiff. Having untreated or uncontrolled hypertension can cause heart attacks, strokes, kidney disease, and other problems. A blood pressure reading consists of a higher number over a lower number. Ideally, your blood pressure should be below 120/80. The first ("top") number is called the systolic pressure. It is a measure of the pressure in your arteries as your heart beats. The second ("bottom") number is called the diastolic pressure. It is a measure of the pressure in your arteries as the heart relaxes. What are the causes? The cause of this condition is not known. What increases the risk? Some risk factors for high blood pressure are under your control. Others are not. Factors you can change  Smoking.  Having type 2 diabetes mellitus, high cholesterol, or both.  Not getting enough exercise or physical activity.  Being overweight.  Having too much fat, sugar, calories, or salt (sodium) in your diet.  Drinking too much alcohol. Factors that are difficult or impossible to change  Having chronic kidney disease.  Having a family history of high blood pressure.  Age. Risk increases with age.  Race. You may be at higher risk if you are African-American.  Gender. Men are at higher risk than women before age 45. After age 65, women are at higher risk than men.  Having obstructive sleep apnea.  Stress. What are the signs or symptoms? Extremely high blood pressure (hypertensive crisis) may cause:  Headache.  Anxiety.  Shortness of breath.  Nosebleed.  Nausea and vomiting.  Severe chest pain.  Jerky movements you cannot control (seizures).  How is this  diagnosed? This condition is diagnosed by measuring your blood pressure while you are seated, with your arm resting on a surface. The cuff of the blood pressure monitor will be placed directly against the skin of your upper arm at the level of your heart. It should be measured at least twice using the same arm. Certain conditions can cause a difference in blood pressure between your right and left arms. Certain factors can cause blood pressure readings to be lower or higher than normal (elevated) for a short period of time:  When your blood pressure is higher when you are in a health care provider's office than when you are at home, this is called white coat hypertension. Most people with this condition do not need medicines.  When your blood pressure is higher at home than when you are in a health care provider's office, this is called masked hypertension. Most people with this condition may need medicines to control blood pressure.  If you have a high blood pressure reading during one visit or you have normal blood pressure with other risk factors:  You may be asked to return on a different day to have your blood pressure checked again.  You may be asked to monitor your blood pressure at home for 1 week or longer.  If you are diagnosed with hypertension, you may have other blood or imaging tests to help your health care provider understand your overall risk for other conditions. How is this treated? This condition is treated by making healthy lifestyle changes, such as eating healthy foods, exercising more, and reducing your alcohol intake. Your   health care provider may prescribe medicine if lifestyle changes are not enough to get your blood pressure under control, and if:  Your systolic blood pressure is above 130.  Your diastolic blood pressure is above 80.  Your personal target blood pressure may vary depending on your medical conditions, your age, and other factors. Follow these  instructions at home: Eating and drinking  Eat a diet that is high in fiber and potassium, and low in sodium, added sugar, and fat. An example eating plan is called the DASH (Dietary Approaches to Stop Hypertension) diet. To eat this way: ? Eat plenty of fresh fruits and vegetables. Try to fill half of your plate at each meal with fruits and vegetables. ? Eat whole grains, such as whole wheat pasta, brown rice, or whole grain bread. Fill about one quarter of your plate with whole grains. ? Eat or drink low-fat dairy products, such as skim milk or low-fat yogurt. ? Avoid fatty cuts of meat, processed or cured meats, and poultry with skin. Fill about one quarter of your plate with lean proteins, such as fish, chicken without skin, beans, eggs, and tofu. ? Avoid premade and processed foods. These tend to be higher in sodium, added sugar, and fat.  Reduce your daily sodium intake. Most people with hypertension should eat less than 1,500 mg of sodium a day.  Limit alcohol intake to no more than 1 drink a day for nonpregnant women and 2 drinks a day for men. One drink equals 12 oz of beer, 5 oz of wine, or 1 oz of hard liquor. Lifestyle  Work with your health care provider to maintain a healthy body weight or to lose weight. Ask what an ideal weight is for you.  Get at least 30 minutes of exercise that causes your heart to beat faster (aerobic exercise) most days of the week. Activities may include walking, swimming, or biking.  Include exercise to strengthen your muscles (resistance exercise), such as pilates or lifting weights, as part of your weekly exercise routine. Try to do these types of exercises for 30 minutes at least 3 days a week.  Do not use any products that contain nicotine or tobacco, such as cigarettes and e-cigarettes. If you need help quitting, ask your health care provider.  Monitor your blood pressure at home as told by your health care provider.  Keep all follow-up visits as  told by your health care provider. This is important. Medicines  Take over-the-counter and prescription medicines only as told by your health care provider. Follow directions carefully. Blood pressure medicines must be taken as prescribed.  Do not skip doses of blood pressure medicine. Doing this puts you at risk for problems and can make the medicine less effective.  Ask your health care provider about side effects or reactions to medicines that you should watch for. Contact a health care provider if:  You think you are having a reaction to a medicine you are taking.  You have headaches that keep coming back (recurring).  You feel dizzy.  You have swelling in your ankles.  You have trouble with your vision. Get help right away if:  You develop a severe headache or confusion.  You have unusual weakness or numbness.  You feel faint.  You have severe pain in your chest or abdomen.  You vomit repeatedly.  You have trouble breathing. Summary  Hypertension is when the force of blood pumping through your arteries is too strong. If this condition is not   controlled, it may put you at risk for serious complications.  Your personal target blood pressure may vary depending on your medical conditions, your age, and other factors. For most people, a normal blood pressure is less than 120/80.  Hypertension is treated with lifestyle changes, medicines, or a combination of both. Lifestyle changes include weight loss, eating a healthy, low-sodium diet, exercising more, and limiting alcohol. This information is not intended to replace advice given to you by your health care provider. Make sure you discuss any questions you have with your health care provider. Document Released: 09/21/2005 Document Revised: 08/19/2016 Document Reviewed: 08/19/2016 Elsevier Interactive Patient Education  2018 Elsevier Inc.     IF you received an x-ray today, you will receive an invoice from [facility]. Please contact [facility] at [phone] with questions or concerns regarding your invoice.   IF you received labwork today, you will receive an invoice from [facility]. Please contact [facility] at [phone] with questions or concerns regarding your invoice.   Our billing staff will not be able to assist you with questions regarding bills from these companies.  You will be contacted with the lab results as soon as they are available. The fastest way to get your results is to activate your My Chart account. Instructions are located on the last page of this paperwork. If you have not heard from us regarding the results in 2 weeks, please contact this office.     

## 2017-04-29 NOTE — Progress Notes (Signed)
MRN: [id number] DOB: October 19, 1960  Subjective:   Tricia Bowen is a 56 y.o. female presenting for BP check, medication refill.  BP - Patient presented to [facility] yesterday for blood work and had her blood pressure check. She was told that she had elevated blood pressure readings despite rechecks. She was advised that she needed to have it checked with [place]. Denies history of HTN, having to take medications for high BP. 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 Also reports history of intermittent shob associated with her asthma. Has lower leg swelling but patient has sedentary lifestyle, does not wear compression stockings. Otherwise, denies dizziness, chronic headache, chest pain, heart racing, palpitations, nausea, vomiting, hematuria. Denies smoking cigarettes.  Med refill - Patient has a history of rosacea. She uses Metrogel for this, has good results with it and would like a refill.  [name] has a current medication list which includes the following prescription(s): albuterol, budesonide-formoterol, cetirizine, diphenhydramine, fluticasone, ibuprofen, and metronidazole. Also is allergic to penicillins.  [name]  has a past medical history of Allergy; Anemia; Arthritis; Asthma; and GERD (gastroesophageal reflux disease). Also  has a past surgical history that includes Tubal ligation (1985); Cyst removal leg (Right, 1979); Tonsillectomy (1980); and Lipoma excision (Left, 2011).  Objective:   Vitals: BP 122/80   Pulse 69   Temp 98.1 F (36.7 C) (Oral)   Resp 18   Ht 5\' 6"  (1.676 m)   Wt 214 lb (97.1 kg)   SpO2 96%   BMI 34.54 kg/m   BP Readings from Last 3 Encounters:  04/29/17 122/80  02/06/17 123/85  09/09/16 126/80   Vitals with BMI 04/29/2017 02/06/2017 02/06/2017 09/09/2016 12/11/2015  Height 5\' 6"   5\' 6"  5' 6.5"   Weight 214 lbs  212 lbs 215 lbs 212 lbs 3 oz  BMI 34.6  32.6 71.2   Systolic 458 099 833 825 053  Diastolic 80 85 82 80 74   Pulse 69  82 70 91  Respirations 18  14 17 18    Vitals with BMI 12/08/2015 12/03/2015 09/03/2015 08/01/2015 05/30/2015  Height 5\' 6"  5\' 6"  5\' 6"  5\' 6"  5\' 6"   Weight 208 lbs 208 lbs 3 oz 220 lbs 217 lbs 213 lbs  BMI 33.6 33.7 35.6 97.6 73.4  Systolic 193 790 240 973 532  Diastolic 82 80 76 80 80  Pulse 80 102 82 93 72  Respirations  20 17 18 18    Vitals with BMI 08/08/2014 08/08/2014 08/08/2014 06/27/2014 06/27/2014  Height       Weight       BMI       Systolic 992 426 834 196 222  Diastolic 69 79 90 71 62  Pulse 71 62 74 68 72  Respirations 18 14 16  34 28   Vitals with BMI 06/27/2014 06/27/2014 06/27/2014 06/27/2014 06/27/2014  Height       Weight       BMI       Systolic 979 892 119 417 408  Diastolic 75 71 71 70 64  Pulse 75 73 75 74 75  Respirations 20 16 0 21 17   Vitals with BMI 06/27/2014 06/27/2014 06/27/2014 06/27/2014 06/13/2014  Height    5\' 6"  5\' 6"   Weight    207 lbs 207 lbs 10 oz  BMI    14.4 81.8  Systolic 563 149 702 637   Diastolic 73 76 84 84   Pulse 74 83 78 82  Respirations 17 26 20      Vitals with BMI 04/17/2014 04/02/2014 08/19/2013 07/26/2013  Height 5' 6.25" 5\' 6"  5\' 6"  5\' 6"   Weight 209 lbs 13 oz 208 lbs 6 oz 204 lbs 13 oz 206 lbs  BMI 33.7 33.7 74.8 27.0  Systolic 786 754 492 010  Diastolic 82 78 76 80  Pulse 78 82 82 72  Respirations 16 18 18 18    Vitals with BMI 07/17/2013 07/15/2013 04/20/2013 01/14/2013  Height 5' 5.5" 5' 5.75" 5\' 6"  5\' 6"   Weight 204 lbs 10 oz 205 lbs 3 oz 200 lbs 199 lbs  BMI 33.6 33.4 07.1 21.9  Systolic 758 832 549 826  Diastolic 84 72 82 76  Pulse 80 112 80 77  Respirations 16 18 16 16     Physical Exam  Constitutional: She is oriented to person, place, and time. She appears well-developed and well-nourished.  HENT:  Mouth/Throat: Oropharynx is clear and moist.  Eyes: Pupils are equal, round, and reactive to light. No scleral icterus.  Neck: Normal range of motion. Neck supple. No thyromegaly present.  Cardiovascular: Normal  rate, regular rhythm and intact distal pulses.  Exam reveals no gallop and no friction rub.   No murmur heard. Pulmonary/Chest: No respiratory distress. She has no wheezes. She has no rales.  Neurological: She is alert and oriented to person, place, and time.  Skin: Skin is warm and dry.  Psychiatric: She has a normal mood and affect.   Assessment and Plan :   1. Elevated blood pressure reading without diagnosis of hypertension - Reassured patient about her blood pressure. I also reviewed her physical exam labs from 2017 from an outside lab with her today. She plans on rtc for a review of other labs when she gets the results from her most recent lab draw from yesterday.   2. Rosacea - Stable, medication refilled. - metroNIDAZOLE (METROGEL) 0.75 % gel; Apply 1 application topically 2 (two) times daily.  Dispense: 45 g; Refill: [place], PA-C [facility] [phone] 04/29/2017  5:15 PM

## 2017-05-10 ENCOUNTER — Telehealth: Payer: Self-pay

## 2017-05-10 NOTE — Telephone Encounter (Signed)
Received notification from [facility] stating that patient's refill history on her symbicort over the past 6 mths may show that she is non-compliant with dosing of this medication.  They would like this discussed with patient, and find out why she has not been taking her medication. I called patient and she states that she has been getting it filled at [facility] on [address] every month. I will notify [facility] as requested (by fax)

## 2017-07-21 ENCOUNTER — Encounter: Payer: Self-pay | Admitting: Internal Medicine

## 2017-09-24 ENCOUNTER — Other Ambulatory Visit: Payer: Self-pay | Admitting: Urgent Care

## 2017-09-24 DIAGNOSIS — J4540 Moderate persistent asthma, uncomplicated: Secondary | ICD-10-CM

## 2017-10-26 ENCOUNTER — Other Ambulatory Visit: Payer: Self-pay | Admitting: Urgent Care

## 2017-10-26 DIAGNOSIS — J4540 Moderate persistent asthma, uncomplicated: Secondary | ICD-10-CM

## 2017-10-27 NOTE — Telephone Encounter (Signed)
Medication refill request; last office visit related to asthma 02/2017

## 2017-11-15 ENCOUNTER — Encounter: Payer: Self-pay | Admitting: Physician Assistant

## 2017-11-15 ENCOUNTER — Ambulatory Visit: Payer: BLUE CROSS/BLUE SHIELD | Admitting: Physician Assistant

## 2017-11-15 ENCOUNTER — Other Ambulatory Visit: Payer: Self-pay

## 2017-11-15 VITALS — BP 136/88 | HR 86 | Temp 98.0°F | Resp 18 | Ht 66.46 in | Wt 218.4 lb

## 2017-11-15 DIAGNOSIS — Z23 Encounter for immunization: Secondary | ICD-10-CM

## 2017-11-15 DIAGNOSIS — Z124 Encounter for screening for malignant neoplasm of cervix: Secondary | ICD-10-CM

## 2017-11-15 DIAGNOSIS — R339 Retention of urine, unspecified: Secondary | ICD-10-CM | POA: Diagnosis not present

## 2017-11-15 DIAGNOSIS — R21 Rash and other nonspecific skin eruption: Secondary | ICD-10-CM | POA: Diagnosis not present

## 2017-11-15 LAB — POCT URINALYSIS DIP (MANUAL ENTRY)
BILIRUBIN UA: NEGATIVE
GLUCOSE UA: NEGATIVE mg/dL
Ketones, POC UA: NEGATIVE mg/dL
Leukocytes, UA: NEGATIVE
Nitrite, UA: NEGATIVE
Protein Ur, POC: 30 mg/dL — AB
SPEC GRAV UA: 1.025 (ref 1.010–1.025)
Urobilinogen, UA: 1 E.U./dL
pH, UA: 6.5 (ref 5.0–8.0)

## 2017-11-15 LAB — POCT WET + KOH PREP
TRICH BY WET PREP: ABSENT
Yeast by KOH: ABSENT
Yeast by wet prep: ABSENT

## 2017-11-15 MED ORDER — FLUCONAZOLE 150 MG PO TABS: 150.0000 mg | ORAL_TABLET | ORAL | 0 refills | Status: DC

## 2017-11-15 MED ORDER — NYSTATIN 100000 UNIT/GM EX POWD: Freq: Three times a day (TID) | CUTANEOUS | 0 refills | Status: DC

## 2017-11-15 NOTE — Patient Instructions (Addendum)
We are going to treat your rash with both a topical powder and oral medication. I also recommend airing the area out as much as possible. Do not wear underwear at night time.   In terms of labs, we should have results back within one week and will contact you with those results.  Please return to clinic if symptoms worsen, do not improve, or as needed.   IF you received an x-ray today, you will receive an invoice from [facility]. Please contact [facility] at [phone] with questions or concerns regarding your invoice.   IF you received labwork today, you will receive an invoice from [facility]. Please contact [facility] at [phone] with questions or concerns regarding your invoice.   Our billing staff will not be able to assist you with questions regarding bills from these companies.  You will be contacted with the lab results as soon as they are available. The fastest way to get your results is to activate your My Chart account. Instructions are located on the last page of this paperwork. If you have not heard from [place] regarding the results in 2 weeks, please contact this office.    We recommend that you schedule a mammogram for breast cancer screening. Typically, you do not need a referral to do this. Please contact a local imaging center to schedule your mammogram.  [facility] - [phone]  *ask for the Radiology Department [facility] ([place]) - [phone] or [phone]  [facility] - [phone] [place] [phone] [facility] - [phone]  *ask for the [facility] - [phone]  *ask for the Radiology Department [facility] - [phone]  *ask for the [facility] - [phone]    Intertrigo Intertrigo is skin irritation (inflammation) that happens in warm, moist areas of the body. The  irritation can cause a rash and make skin raw and itchy. The rash is usually pink or red. It happens mostly between folds of skin or where skin rubs together, such as:  Toes.  Armpits.  Groin.  Belly.  Breasts.  Buttocks.  This condition is not passed from person to person (is not contagious). Follow these instructions at home:  Keep the affected area clean and dry.  Do not scratch your skin.  Stay cool as much as possible. Use an air conditioner or fan, if you can.  Apply over-the-counter and prescription medicines only as told by your doctor.  If you were prescribed an antibiotic medicine, use it as told by your doctor. Do not stop using the antibiotic even if your condition starts to get better.  Keep all follow-up visits as told by your doctor. This is important. How is this prevented?  Stay at a healthy weight.  Keep your feet dry. This is very important if you have diabetes. Wear cotton or wool socks.  Take care of and protect the skin in your groin and butt area as told by your doctor.  Do not wear tight clothes. Wear clothes that: ? Are loose. ? Take away moisture from your body. ? Are made of cotton.  Wear a bra that gives good support, if needed.  Shower and dry yourself fully after being active.  Keep your blood sugar under control if you have diabetes. Contact a doctor if:  Your symptoms do not get better with treatment.  Your symptoms get worse or they spread.  You notice  more redness and warmth.  You have a fever. This information is not intended to replace advice given to you by your health care provider. Make sure you discuss any questions you have with your health care provider. Document Released: 10/24/2010 Document Revised: 02/27/2016 Document Reviewed: 03/25/2015 Elsevier Interactive Patient Education  [employer].

## 2017-11-15 NOTE — Progress Notes (Signed)
11/15/2017 at 9:39 PM  Tricia Bowen / DOB: 1961-09-17 / MRN: [id number]  The patient does not have a problem list on file.  SUBJECTIVE  Tricia Bowen is a 57 y.o. postmenopausal  female who complains of feeling as if she is not emptying her bladder as well x 1 week.  She cannot quantify how much she is urinating but notes it just feels like less than she typically does. Has associated suprapubic pressure, urinary frequency, and general irritation.  She denies dysuria, hematuria, flank pain, pelvic pain, cloudy malordorous urine, genital rash and vaginal discharge. Denies stress incontinence.  Does use zyrtec and benedryl occasionally for allergies. She has not been sexually active with her husband in over a year.  Denies smoking.  Notes it feels like she did last time that she had a UTI, but this was years ago.  She is not up-to-date on Pap smear.  Last BMP showed creatinine of 0.8. Denies smoking.   She  has a past medical history of Allergy, Anemia, Arthritis, Asthma, and GERD (gastroesophageal reflux disease).    Medications reviewed and updated by myself where necessary, and exist elsewhere in the encounter.   Tricia Bowen is allergic to penicillins. She  reports that she quit smoking about 9 years ago. Her smoking use included cigarettes. she has never used smokeless tobacco. She reports that she drinks about 1.8 oz of alcohol per week. She reports that she does not use drugs. She  reports that she currently engages in sexual activity. She reports using the following method of birth control/protection: None. The patient  has a past surgical history that includes Tubal ligation (1985); Cyst removal leg (Right, 1979); Tonsillectomy (1980); and Lipoma excision (Left, 2011).  Her family history includes Alzheimer's disease in her mother and paternal grandmother; Colon polyps (age of onset: 72) in her mother; Heart disease in her father, maternal grandfather, maternal grandmother, and paternal  grandfather.  Review of Systems  Constitutional: Negative for chills, diaphoresis and fever.  Gastrointestinal: Negative for abdominal pain, nausea and vomiting.    OBJECTIVE  Her  height is 5' 6.46" (1.688 m) and weight is 218 lb 6.4 oz (99.1 kg). Her oral temperature is 98 F (36.7 C). Her blood pressure is 136/88 and her pulse is 86. Her respiration is 18 and oxygen saturation is 96%.  The patient's body mass index is 34.77 kg/m.  Physical Exam  Constitutional: She is oriented to person, place, and time. She appears well-developed and well-nourished.  HENT:  Head: Normocephalic and atraumatic.  Eyes: Conjunctivae are normal.  Neck: Normal range of motion.  Pulmonary/Chest: Effort normal.  Genitourinary: Vagina normal and uterus normal. There is rash on the right labia. There is rash on the left labia. Uterus is not enlarged and not tender. Cervix exhibits no motion tenderness. Right adnexum displays no mass, no tenderness and no fullness. Left adnexum displays no mass, no tenderness and no fullness. No tenderness in the vagina. No vaginal discharge found.  Genitourinary Comments: Erythematous macular rash with slight maceration of inguinal folds bilaterally extending to labia majora and inferior aspect of interglueteal cleft, satellite lesions noted.   No prolapse noted.    Neurological: She is alert and oriented to person, place, and time.  Skin: Skin is warm and dry.  Psychiatric: She has a normal mood and affect.  Vitals reviewed.   Results for orders placed or performed in visit on 11/15/17 (from the past 24 hour(s))  POCT urinalysis dipstick  Status: Abnormal   Collection Time: 11/15/17  2:48 PM  Result Value Ref Range   Color, UA yellow yellow   Clarity, UA clear clear   Glucose, UA negative negative mg/dL   Bilirubin, UA negative negative   Ketones, POC UA negative negative mg/dL   Spec Grav, UA 1.025 1.010 - 1.025   Blood, UA trace-lysed (A) negative   pH, UA  6.5 5.0 - 8.0   Protein Ur, POC =30 (A) negative mg/dL   Urobilinogen, UA 1.0 0.2 or 1.0 E.U./dL   Nitrite, UA Negative Negative   Leukocytes, UA Negative Negative  POCT Wet + KOH Prep     Status: Abnormal   Collection Time: 11/15/17  3:47 PM  Result Value Ref Range   Yeast by KOH Absent Absent   Yeast by wet prep Absent Absent   WBC by wet prep Moderate (A) Few   Clue Cells Wet Prep HPF POC None None   Trich by wet prep Absent Absent   Bacteria Wet Prep HPF POC Many (A) Few   Epithelial Cells By [employer] Pref ([facility]) Few None, Few, Too numerous to count   RBC,UR,HPF,POC None None RBC/hpf    ASSESSMENT & PLAN  Tricia Bowen was seen today for urinary retention and urinary frequency.  Diagnoses and all orders for this visit:  Urinary retention -     POCT urinalysis dipstick -     Urinalysis, microscopic only -     Urine Culture -     POCT Wet + KOH Prep -     CMP14+EGFR -     CBC with Differential/Platelet  Need for immunization against influenza -     Flu Vaccine QUAD 36+ mos IM  Screening for cervical cancer -     Pap IG and HPV (high risk) DNA detection  Rash and nonspecific skin eruption -     nystatin (MYCOSTATIN/NYSTOP) powder; Apply topically 3 (three) times daily. -     fluconazole (DIFLUCAN) 150 MG tablet; Take 1 tablet (150 mg total) by mouth once a week.   Rash consistent with intertrigo candida, will treat accordingly. Otherwise, GU exam normal. Recommend keeping affected area as dry and aired out as possible. UA with trace lysed blood, no nitrites or leukocytes. Urine micro and culture pending. If no clear etiology for symptoms and labs normal, consider referral to urology if symptoms persist or worsen. Encouraged pt to return in the next couple of weeks for CPE.The patient was advised to call or come back to clinic if she does not see an improvement in symptoms, or worsens with the above plan.   Tenna Delaine, PA-C  [facility] 11/15/2017 9:39 PM

## 2017-11-16 LAB — URINALYSIS, MICROSCOPIC ONLY: CASTS: NONE SEEN /LPF

## 2017-11-16 LAB — CBC WITH DIFFERENTIAL/PLATELET
BASOS ABS: 0 10*3/uL (ref 0.0–0.2)
Basos: 0 %
EOS (ABSOLUTE): 0.3 10*3/uL (ref 0.0–0.4)
Eos: 4 %
HEMOGLOBIN: 13.9 g/dL (ref 11.1–15.9)
Hematocrit: 40.1 % (ref 34.0–46.6)
IMMATURE GRANS (ABS): 0 10*3/uL (ref 0.0–0.1)
IMMATURE GRANULOCYTES: 0 %
LYMPHS: 25 %
Lymphocytes Absolute: 2.1 10*3/uL (ref 0.7–3.1)
MCH: 32.1 pg (ref 26.6–33.0)
MCHC: 34.7 g/dL (ref 31.5–35.7)
MCV: 93 fL (ref 79–97)
MONOCYTES: 6 %
Monocytes Absolute: 0.5 10*3/uL (ref 0.1–0.9)
NEUTROS PCT: 65 %
Neutrophils Absolute: 5.4 10*3/uL (ref 1.4–7.0)
PLATELETS: 289 10*3/uL (ref 150–379)
RBC: 4.33 x10E6/uL (ref 3.77–5.28)
RDW: 13 % (ref 12.3–15.4)
WBC: 8.5 10*3/uL (ref 3.4–10.8)

## 2017-11-16 LAB — CMP14+EGFR
A/G RATIO: 1.7 (ref 1.2–2.2)
ALT: 26 IU/L (ref 0–32)
AST: 18 IU/L (ref 0–40)
Albumin: 4.4 g/dL (ref 3.5–5.5)
Alkaline Phosphatase: 69 IU/L (ref 39–117)
BILIRUBIN TOTAL: 0.5 mg/dL (ref 0.0–1.2)
BUN/Creatinine Ratio: 15 (ref 9–23)
BUN: 13 mg/dL (ref 6–24)
CALCIUM: 9.6 mg/dL (ref 8.7–10.2)
CHLORIDE: 102 mmol/L (ref 96–106)
CO2: 21 mmol/L (ref 20–29)
Creatinine, Ser: 0.84 mg/dL (ref 0.57–1.00)
GFR calc non Af Amer: 78 mL/min/{1.73_m2} (ref >59)
GFR, EST AFRICAN AMERICAN: 90 mL/min/{1.73_m2} (ref >59)
Globulin, Total: 2.6 g/dL (ref 1.5–4.5)
Glucose: 82 mg/dL (ref 65–99)
POTASSIUM: 4.5 mmol/L (ref 3.5–5.2)
Sodium: 143 mmol/L (ref 134–144)
TOTAL PROTEIN: 7 g/dL (ref 6.0–8.5)

## 2017-11-16 LAB — URINE CULTURE

## 2017-11-17 LAB — PAP IG AND HPV HIGH-RISK
HPV, high-risk: NEGATIVE
PAP SMEAR COMMENT: 0

## 2017-11-23 ENCOUNTER — Encounter: Payer: Self-pay | Admitting: Physician Assistant

## 2017-12-02 ENCOUNTER — Other Ambulatory Visit: Payer: Self-pay | Admitting: Urgent Care

## 2017-12-02 DIAGNOSIS — J4540 Moderate persistent asthma, uncomplicated: Secondary | ICD-10-CM

## 2017-12-27 ENCOUNTER — Other Ambulatory Visit: Payer: Self-pay | Admitting: Urgent Care

## 2017-12-27 ENCOUNTER — Other Ambulatory Visit: Payer: Self-pay | Admitting: *Deleted

## 2017-12-27 DIAGNOSIS — J4540 Moderate persistent asthma, uncomplicated: Secondary | ICD-10-CM

## 2018-03-03 ENCOUNTER — Other Ambulatory Visit: Payer: Self-pay | Admitting: Urgent Care

## 2018-03-03 DIAGNOSIS — J4540 Moderate persistent asthma, uncomplicated: Secondary | ICD-10-CM

## 2018-03-04 NOTE — Telephone Encounter (Signed)
Patient called and informed of the note by [place] at the last OV on 11/15/17 to return in a few weeks for a CPE. I asked the patient did she want to schedule the appointment, she says "I thought I had a physical at that time. I had blood work and a pap smear." I advised the medication will go ahead and be filled and to return in 1 year for another physical, she verbalized understanding.

## 2018-04-26 ENCOUNTER — Ambulatory Visit: Payer: BLUE CROSS/BLUE SHIELD | Admitting: Urgent Care

## 2018-04-26 ENCOUNTER — Encounter: Payer: Self-pay | Admitting: Urgent Care

## 2018-04-26 VITALS — BP 123/82 | HR 89 | Temp 99.0°F | Resp 17 | Ht 65.5 in | Wt 217.0 lb

## 2018-04-26 DIAGNOSIS — E860 Dehydration: Secondary | ICD-10-CM

## 2018-04-26 DIAGNOSIS — R197 Diarrhea, unspecified: Secondary | ICD-10-CM

## 2018-04-26 DIAGNOSIS — K529 Noninfective gastroenteritis and colitis, unspecified: Secondary | ICD-10-CM

## 2018-04-26 LAB — POCT URINALYSIS DIP (MANUAL ENTRY)
BILIRUBIN UA: NEGATIVE
Glucose, UA: NEGATIVE mg/dL
Ketones, POC UA: NEGATIVE mg/dL
NITRITE UA: NEGATIVE
PH UA: 5.5 (ref 5.0–8.0)
Spec Grav, UA: 1.02 (ref 1.010–1.025)
UROBILINOGEN UA: 0.2 U/dL

## 2018-04-26 MED ORDER — LOPERAMIDE HCL 2 MG PO TABS: 2.0000 mg | ORAL_TABLET | ORAL | 0 refills | Status: DC | PRN

## 2018-04-26 NOTE — Progress Notes (Signed)
   MRN: [id number] DOB: 12/07/1960  Subjective:   Tricia Bowen is a 57 y.o. female presenting for 4 day history of malaise, nausea without vomiting (now improved), diarrhea, aches of forearms/hands, subjective fever and chills. Has had stomach cramping. She is having ~8-10 bowel movements per day. Had 1 episode of slight blood in her stool. Denies dysuria, hematuria. Has tried ibuprofen with some relief of the aches. Has also been using CBD oil.   Tricia Bowen has a current medication list which includes the following prescription(s): cetirizine, diphenhydramine, fluticasone, ibuprofen, proair hfa, symbicort, and albuterol. Also is allergic to penicillins.  Tricia Bowen  has a past medical history of Allergy, Anemia, Arthritis, Asthma, and GERD (gastroesophageal reflux disease). Also  has a past surgical history that includes Tubal ligation (1985); Cyst removal leg (Right, 1979); Tonsillectomy (1980); and Lipoma excision (Left, 2011).  Objective:   Vitals: BP 123/82   Pulse 89   Temp 99 F (37.2 C) (Oral)   Resp 17   Ht 5' 5.5" (1.664 m)   Wt 217 lb (98.4 kg)   SpO2 98%   BMI 35.56 kg/m   Physical Exam  Constitutional: She is oriented to person, place, and time. She appears well-developed and well-nourished.  HENT:  Mouth/Throat: Oropharynx is clear and moist.  Eyes: No scleral icterus.  Cardiovascular: Normal rate, regular rhythm, normal heart sounds and intact distal pulses. Exam reveals no gallop and no friction rub.  No murmur heard. Pulmonary/Chest: Effort normal and breath sounds normal. No stridor. No respiratory distress. She has no wheezes. She has no rales.  Abdominal: Soft. Bowel sounds are normal. She exhibits no distension and no mass. There is no tenderness. There is no rebound and no guarding.  No CVA tenderness.  Neurological: She is alert and oriented to person, place, and time.  Skin: Skin is warm and dry.   Results for orders placed or performed in visit on 04/26/18 (from the  past 24 hour(s))  POCT urinalysis dipstick     Status: Abnormal   Collection Time: 04/26/18  5:58 PM  Result Value Ref Range   Color, UA yellow yellow   Clarity, UA clear clear   Glucose, UA negative negative mg/dL   Bilirubin, UA negative negative   Ketones, POC UA negative negative mg/dL   Spec Grav, UA 1.020 1.010 - 1.025   Blood, UA large (A) negative   pH, UA 5.5 5.0 - 8.0   Protein Ur, POC trace (A) negative mg/dL   Urobilinogen, UA 0.2 0.2 or 1.0 E.U./dL   Nitrite, UA Negative Negative   Leukocytes, UA Trace (A) Negative   Assessment and Plan :   Diarrhea, unspecified type - Plan: POCT urinalysis dipstick, Stool culture  Gastroenteritis  Dehydration  We will manage supportively with loperamide.  Patient advised to hydrate as best she can, reviewed foods that can help with her gastroenteritis.  Stool culture is pending.  Return to clinic precautions reviewed.  Jaynee Eagles, PA-C [facility] [phone] 04/26/2018  5:12 PM

## 2018-04-26 NOTE — Patient Instructions (Addendum)
Viral Gastroenteritis, Adult Viral gastroenteritis is also known as the stomach flu. This condition is caused by certain germs (viruses). These germs can be passed from person to person very easily (are very contagious). This condition can cause sudden watery poop (diarrhea), fever, and throwing up (vomiting). Having watery poop and throwing up can make you feel weak and cause you to get dehydrated. Dehydration can make you tired and thirsty, make you have a dry mouth, and make it so you pee (urinate) less often. Older adults and people with other diseases or a weak defense system (immune system) are at higher risk for dehydration. It is important to replace the fluids that you lose from having watery poop and throwing up. Follow these instructions at home: Follow instructions from your doctor about how to care for yourself at home. Eating and drinking  Follow these instructions as told by your doctor:  Take an oral rehydration solution (ORS). This is a drink that is sold at pharmacies and stores.  Drink clear fluids in small amounts as you are able, such as: ? Water. ? Ice chips. ? Diluted fruit juice. ? Low-calorie sports drinks.  Eat bland, easy-to-digest foods in small amounts as you are able, such as: ? Bananas. ? Applesauce. ? Rice. ? Low-fat (lean) meats. ? Toast. ? Crackers.  Avoid fluids that have a lot of sugar or caffeine in them.  Avoid alcohol.  Avoid spicy or fatty foods.  General instructions  Drink enough fluid to keep your pee (urine) clear or pale yellow.  Wash your hands often. If you cannot use soap and water, use hand sanitizer.  Make sure that all people in your home wash their hands well and often.  Rest at home while you get better.  Take over-the-counter and prescription medicines only as told by your doctor.  Watch your condition for any changes.  Take a warm bath to help with any burning or pain from having watery poop.  Keep all follow-up  visits as told by your doctor. This is important. Contact a doctor if:  You cannot keep fluids down.  Your symptoms get worse.  You have new symptoms.  You feel light-headed or dizzy.  You have muscle cramps. Get help right away if:  You have chest pain.  You feel very weak or you pass out (faint).  You see blood in your throw-up.  Your throw-up looks like coffee grounds.  You have bloody or black poop (stools) or poop that look like tar.  You have a very bad headache, a stiff neck, or both.  You have a rash.  You have very bad pain, cramping, or bloating in your belly (abdomen).  You have trouble breathing.  You are breathing very quickly.  Your heart is beating very quickly.  Your skin feels cold and clammy.  You feel confused.  You have pain when you pee.  You have signs of dehydration, such as: ? Dark pee, hardly any pee, or no pee. ? Cracked lips. ? Dry mouth. ? Sunken eyes. ? Sleepiness. ? Weakness. This information is not intended to replace advice given to you by your health care provider. Make sure you discuss any questions you have with your health care provider. Document Released: 03/09/2008 Document Revised: 04/10/2016 Document Reviewed: 05/28/2015 Elsevier Interactive Patient Education  2017 [employer]     Diarrhea, Adult Diarrhea is frequent loose and watery bowel movements. Diarrhea can make you feel weak and cause you to become dehydrated. Dehydration can make  you tired and thirsty, cause you to have a dry mouth, and decrease how often you urinate. Diarrhea typically lasts 2-3 days. However, it can last longer if it is a sign of something more serious. It is important to treat your diarrhea as told by your health care provider. Follow these instructions at home: Eating and drinking  Follow these recommendations as told by your health care provider:  Take an oral rehydration solution (ORS). This is a drink that is sold at pharmacies  and retail stores.  Drink clear fluids, such as water, ice chips, diluted fruit juice, and low-calorie sports drinks.  Eat bland, easy-to-digest foods in small amounts as you are able. These foods include bananas, applesauce, rice, lean meats, toast, and crackers.  Avoid drinking fluids that contain a lot of sugar or caffeine, such as energy drinks, sports drinks, and soda.  Avoid alcohol.  Avoid spicy or fatty foods.  General instructions  Drink enough fluid to keep your urine clear or pale yellow.  Wash your hands often. If soap and water are not available, use hand sanitizer.  Make sure that all people in your household wash their hands well and often.  Take over-the-counter and prescription medicines only as told by your health care provider.  Rest at home while you recover.  Watch your condition for any changes.  Take a warm bath to relieve any burning or pain from frequent diarrhea episodes.  Keep all follow-up visits as told by your health care provider. This is important. Contact a health care provider if:  You have a fever.  Your diarrhea gets worse.  You have new symptoms.  You cannot keep fluids down.  You feel light-headed or dizzy.  You have a headache  You have muscle cramps. Get help right away if:  You have chest pain.  You feel extremely weak or you faint.  You have bloody or black stools or stools that look like tar.  You have severe pain, cramping, or bloating in your abdomen.  You have trouble breathing or you are breathing very quickly.  Your heart is beating very quickly.  Your skin feels cold and clammy.  You feel confused.  You have signs of dehydration, such as: ? Dark urine, very little urine, or no urine. ? Cracked lips. ? Dry mouth. ? Sunken eyes. ? Sleepiness. ? Weakness. This information is not intended to replace advice given to you by your health care provider. Make sure you discuss any questions you have with your  health care provider. Document Released: 09/11/2002 Document Revised: 01/30/2016 Document Reviewed: 05/28/2015 Elsevier Interactive Patient Education  2018 [employer].     IF you received an x-ray today, you will receive an invoice from [facility]. Please contact [facility] at [phone] with questions or concerns regarding your invoice.   IF you received labwork today, you will receive an invoice from [facility]. Please contact [facility] at [phone] with questions or concerns regarding your invoice.   Our billing staff will not be able to assist you with questions regarding bills from these companies.  You will be contacted with the lab results as soon as they are available. The fastest way to get your results is to activate your My Chart account. Instructions are located on the last page of this paperwork. If you have not heard from [place] regarding the results in 2 weeks, please contact this office.

## 2018-04-28 ENCOUNTER — Encounter: Payer: Self-pay | Admitting: Urgent Care

## 2018-05-01 LAB — STOOL CULTURE: E COLI SHIGA TOXIN ASSAY: NEGATIVE

## 2018-05-02 ENCOUNTER — Ambulatory Visit (INDEPENDENT_AMBULATORY_CARE_PROVIDER_SITE_OTHER): Payer: BLUE CROSS/BLUE SHIELD | Admitting: Urgent Care

## 2018-05-02 ENCOUNTER — Encounter: Payer: Self-pay | Admitting: Urgent Care

## 2018-05-02 VITALS — BP 136/81 | HR 75 | Temp 98.5°F | Resp 18 | Ht 65.5 in | Wt 219.6 lb

## 2018-05-02 DIAGNOSIS — Z1329 Encounter for screening for other suspected endocrine disorder: Secondary | ICD-10-CM | POA: Diagnosis not present

## 2018-05-02 DIAGNOSIS — Z1231 Encounter for screening mammogram for malignant neoplasm of breast: Secondary | ICD-10-CM | POA: Diagnosis not present

## 2018-05-02 DIAGNOSIS — Z1239 Encounter for other screening for malignant neoplasm of breast: Secondary | ICD-10-CM

## 2018-05-02 DIAGNOSIS — Z1321 Encounter for screening for nutritional disorder: Secondary | ICD-10-CM

## 2018-05-02 DIAGNOSIS — Z0000 Encounter for general adult medical examination without abnormal findings: Secondary | ICD-10-CM

## 2018-05-02 DIAGNOSIS — Z6835 Body mass index (BMI) 35.0-35.9, adult: Secondary | ICD-10-CM

## 2018-05-02 DIAGNOSIS — Z1211 Encounter for screening for malignant neoplasm of colon: Secondary | ICD-10-CM | POA: Diagnosis not present

## 2018-05-02 DIAGNOSIS — Z13228 Encounter for screening for other metabolic disorders: Secondary | ICD-10-CM

## 2018-05-02 DIAGNOSIS — Z131 Encounter for screening for diabetes mellitus: Secondary | ICD-10-CM

## 2018-05-02 DIAGNOSIS — Z130 Encounter for screening for diseases of the blood and blood-forming organs and certain disorders involving the immune mechanism: Secondary | ICD-10-CM

## 2018-05-02 DIAGNOSIS — Z1159 Encounter for screening for other viral diseases: Secondary | ICD-10-CM

## 2018-05-02 DIAGNOSIS — E669 Obesity, unspecified: Secondary | ICD-10-CM

## 2018-05-02 DIAGNOSIS — Z114 Encounter for screening for human immunodeficiency virus [HIV]: Secondary | ICD-10-CM

## 2018-05-02 DIAGNOSIS — Z23 Encounter for immunization: Secondary | ICD-10-CM | POA: Diagnosis not present

## 2018-05-02 DIAGNOSIS — Z87891 Personal history of nicotine dependence: Secondary | ICD-10-CM

## 2018-05-02 NOTE — Progress Notes (Signed)
MRN: [id number]  Subjective:   Tricia Bowen is a 57 y.o. female presenting for annual physical exam and biometric screening for her employer.  Patient works as a Land. She reports that she quit smoking about 9 years ago. Her smoking use included cigarettes. She has never used smokeless tobacco. She reports that she drinks about 1.8 oz of alcohol per week. She reports that she does not use drugs.    Medical care team includes: PCP: Jaynee Eagles, PA-C Vision: She is due for an eye exam, plans on scheduling this soon.  Dental: Needs to schedule this, has not had dental care in 4 years.  OB/GYN: Pap smear done 11/15/2017, positive ASCUS with negative HPV. Not due at this time. Specialists: Has seen an allergist previously.  Health Maintenance: Patient is due to colonoscopy with [facility], due to screening mammogram.  He is agreeable to screening for HIV and hepatitis C.  Currently, patient reports that she is doing well with her allergy medications and asthma medications.  Tricia Bowen has a current medication list which includes the following prescription(s): cetirizine, diphenhydramine, fluticasone, ibuprofen, proair hfa, and symbicort. She is allergic to penicillins. Tricia Bowen  has a past medical history of Allergy, Anemia, Arthritis, Asthma, and GERD (gastroesophageal reflux disease). Also  has a past surgical history that includes Tubal ligation (1985); Cyst removal leg (Right, 1979); Tonsillectomy (1980); and Lipoma excision (Left, 2011). Her family history includes Alzheimer's disease in her mother and paternal grandmother; Colon polyps (age of onset: 76) in her mother; Heart disease in her father, maternal grandfather, maternal grandmother, and paternal grandfather.  ROS  Objective:   Vitals: BP 136/81   Pulse 75   Temp 98.5 F (36.9 C) (Oral)   Resp 18   Ht 5' 5.5" (1.664 m)   Wt 219 lb 9.6 oz (99.6 kg)   SpO2 95%   BMI 35.99 kg/m    Visual Acuity Screening   Right  eye Left eye Both eyes  Without correction: 20/70 20/40 20/40   With correction:       Physical Exam  Constitutional: She is oriented to person, place, and time. She appears well-developed and well-nourished.  HENT:  TM's intact bilaterally, no effusions or erythema. Nasal turbinates pink and moist, nasal passages patent. No sinus tenderness. Oropharynx clear, mucous membranes moist, dentition in good repair.  Eyes: Pupils are equal, round, and reactive to light. Conjunctivae and EOM are normal. Right eye exhibits no discharge. Left eye exhibits no discharge. No scleral icterus.  Neck: Normal range of motion. Neck supple. No thyromegaly present.  Cardiovascular: Normal rate, regular rhythm, normal heart sounds and intact distal pulses. Exam reveals no gallop and no friction rub.  No murmur heard. Pulmonary/Chest: Effort normal and breath sounds normal. No stridor. No respiratory distress. She has no wheezes. She has no rales.  Abdominal: Soft. Bowel sounds are normal. She exhibits no distension and no mass. There is no tenderness.  Musculoskeletal: Normal range of motion. She exhibits no edema or tenderness.  Lymphadenopathy:    She has no cervical adenopathy.  Neurological: She is alert and oriented to person, place, and time. She has normal reflexes. She displays normal reflexes. Coordination normal.  Skin: Skin is warm and dry. No rash noted. No erythema. No pallor.  Psychiatric: She has a normal mood and affect.   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  Labs pending. Discussed healthy lifestyle, diet, exercise, preventative care, vaccinations, and addressed patient's concerns.  Referrals pending for her colonoscopy, mammogram.  Updated her Tdap today.  Patient plans on scheduling both her dental and eye exams.  Also reviewed her lab results from her last office visit.   Jaynee Eagles, PA-C [facility] [phone] 05/02/2018  10:04 AM

## 2018-05-02 NOTE — Patient Instructions (Addendum)
Health Maintenance, Female Adopting a healthy lifestyle and getting preventive care can go a long way to promote health and wellness. Talk with your health care provider about what schedule of regular examinations is right for you. This is a good chance for you to check in with your provider about disease prevention and staying healthy. In between checkups, there are plenty of things you can do on your own. Experts have done a lot of research about which lifestyle changes and preventive measures are most likely to keep you healthy. Ask your health care provider for more information. Weight and diet Eat a healthy diet  Be sure to include plenty of vegetables, fruits, low-fat dairy products, and lean protein.  Do not eat a lot of foods high in solid fats, added sugars, or salt.  Get regular exercise. This is one of the most important things you can do for your health. ? Most adults should exercise for at least 150 minutes each week. The exercise should increase your heart rate and make you sweat (moderate-intensity exercise). ? Most adults should also do strengthening exercises at least twice a week. This is in addition to the moderate-intensity exercise.  Maintain a healthy weight  Body mass index (BMI) is a measurement that can be used to identify possible weight problems. It estimates body fat based on height and weight. Your health care provider can help determine your BMI and help you achieve or maintain a healthy weight.  For females 20 years of age and older: ? A BMI below 18.5 is considered underweight. ? A BMI of 18.5 to 24.9 is normal. ? A BMI of 25 to 29.9 is considered overweight. ? A BMI of 30 and above is considered obese.  Watch levels of cholesterol and blood lipids  You should start having your blood tested for lipids and cholesterol at 57 years of age, then have this test every 5 years.  You may need to have your cholesterol levels checked more often if: ? Your lipid or  cholesterol levels are high. ? You are older than 57 years of age. ? You are at high risk for heart disease.  Cancer screening Lung Cancer  Lung cancer screening is recommended for adults 55-80 years old who are at high risk for lung cancer because of a history of smoking.  A yearly low-dose CT scan of the lungs is recommended for people who: ? Currently smoke. ? Have quit within the past 15 years. ? Have at least a 30-pack-year history of smoking. A pack year is smoking an average of one pack of cigarettes a day for 1 year.  Yearly screening should continue until it has been 15 years since you quit.  Yearly screening should stop if you develop a health problem that would prevent you from having lung cancer treatment.  Breast Cancer  Practice breast self-awareness. This means understanding how your breasts normally appear and feel.  It also means doing regular breast self-exams. Let your health care provider know about any changes, no matter how small.  If you are in your 20s or 30s, you should have a clinical breast exam (CBE) by a health care provider every 1-3 years as part of a regular health exam.  If you are 40 or older, have a CBE every year. Also consider having a breast X-ray (mammogram) every year.  If you have a family history of breast cancer, talk to your health care provider about genetic screening.  If you are at high risk   for breast cancer, talk to your health care provider about having an MRI and a mammogram every year.  Breast cancer gene (BRCA) assessment is recommended for women who have family members with BRCA-related cancers. BRCA-related cancers include: ? Breast. ? Ovarian. ? Tubal. ? Peritoneal cancers.  Results of the assessment will determine the need for genetic counseling and BRCA1 and BRCA2 testing.  Cervical Cancer Your health care provider may recommend that you be screened regularly for cancer of the pelvic organs (ovaries, uterus, and  vagina). This screening involves a pelvic examination, including checking for microscopic changes to the surface of your cervix (Pap test). You may be encouraged to have this screening done every 3 years, beginning at age 22.  For women ages 56-65, health care providers may recommend pelvic exams and Pap testing every 3 years, or they may recommend the Pap and pelvic exam, combined with testing for human papilloma virus (HPV), every 5 years. Some types of HPV increase your risk of cervical cancer. Testing for HPV may also be done on women of any age with unclear Pap test results.  Other health care providers may not recommend any screening for nonpregnant women who are considered low risk for pelvic cancer and who do not have symptoms. Ask your health care provider if a screening pelvic exam is right for you.  If you have had past treatment for cervical cancer or a condition that could lead to cancer, you need Pap tests and screening for cancer for at least 20 years after your treatment. If Pap tests have been discontinued, your risk factors (such as having a new sexual partner) need to be reassessed to determine if screening should resume. Some women have medical problems that increase the chance of getting cervical cancer. In these cases, your health care provider may recommend more frequent screening and Pap tests.  Colorectal Cancer  This type of cancer can be detected and often prevented.  Routine colorectal cancer screening usually begins at 57 years of age and continues through 57 years of age.  Your health care provider may recommend screening at an earlier age if you have risk factors for colon cancer.  Your health care provider may also recommend using home test kits to check for hidden blood in the stool.  A small camera at the end of a tube can be used to examine your colon directly (sigmoidoscopy or colonoscopy). This is done to check for the earliest forms of colorectal  cancer.  Routine screening usually begins at age 33.  Direct examination of the colon should be repeated every 5-10 years through 57 years of age. However, you may need to be screened more often if early forms of precancerous polyps or small growths are found.  Skin Cancer  Check your skin from head to toe regularly.  Tell your health care provider about any new moles or changes in moles, especially if there is a change in a mole's shape or color.  Also tell your health care provider if you have a mole that is larger than the size of a pencil eraser.  Always use sunscreen. Apply sunscreen liberally and repeatedly throughout the day.  Protect yourself by wearing long sleeves, pants, a wide-brimmed hat, and sunglasses whenever you are outside.  Heart disease, diabetes, and high blood pressure  High blood pressure causes heart disease and increases the risk of stroke. High blood pressure is more likely to develop in: ? People who have blood pressure in the high end of  the normal range (130-139/85-89 mm Hg). ? People who are overweight or obese. ? People who are African American.  If you are 21-29 years of age, have your blood pressure checked every 3-5 years. If you are 3 years of age or older, have your blood pressure checked every year. You should have your blood pressure measured twice-once when you are at a hospital or clinic, and once when you are not at a hospital or clinic. Record the average of the two measurements. To check your blood pressure when you are not at a hospital or clinic, you can use: ? An automated blood pressure machine at a pharmacy. ? A home blood pressure monitor.  If you are between 17 years and 37 years old, ask your health care provider if you should take aspirin to prevent strokes.  Have regular diabetes screenings. This involves taking a blood sample to check your fasting blood sugar level. ? If you are at a normal weight and have a low risk for diabetes,  have this test once every three years after 57 years of age. ? If you are overweight and have a high risk for diabetes, consider being tested at a younger age or more often. Preventing infection Hepatitis B  If you have a higher risk for hepatitis B, you should be screened for this virus. You are considered at high risk for hepatitis B if: ? You were born in a country where hepatitis B is common. Ask your health care provider which countries are considered high risk. ? Your parents were born in a high-risk country, and you have not been immunized against hepatitis B (hepatitis B vaccine). ? You have HIV or AIDS. ? You use needles to inject street drugs. ? You live with someone who has hepatitis B. ? You have had sex with someone who has hepatitis B. ? You get hemodialysis treatment. ? You take certain medicines for conditions, including cancer, organ transplantation, and autoimmune conditions.  Hepatitis C  Blood testing is recommended for: ? Everyone born from [birth year] through 1965. ? Anyone with known risk factors for hepatitis C.  Sexually transmitted infections (STIs)  You should be screened for sexually transmitted infections (STIs) including gonorrhea and chlamydia if: ? You are sexually active and are younger than 57 years of age. ? You are older than 57 years of age and your health care provider tells you that you are at risk for this type of infection. ? Your sexual activity has changed since you were last screened and you are at an increased risk for chlamydia or gonorrhea. Ask your health care provider if you are at risk.  If you do not have HIV, but are at risk, it may be recommended that you take a prescription medicine daily to prevent HIV infection. This is called pre-exposure prophylaxis (PrEP). You are considered at risk if: ? You are sexually active and do not regularly use condoms or know the HIV status of your partner(s). ? You take drugs by injection. ? You are  sexually active with a partner who has HIV.  Talk with your health care provider about whether you are at high risk of being infected with HIV. If you choose to begin PrEP, you should first be tested for HIV. You should then be tested every 3 months for as long as you are taking PrEP. Pregnancy  If you are premenopausal and you may become pregnant, ask your health care provider about preconception counseling.  If you may become  pregnant, take 400 to 800 micrograms (mcg) of folic acid every day.  If you want to prevent pregnancy, talk to your health care provider about birth control (contraception). Osteoporosis and menopause  Osteoporosis is a disease in which the bones lose minerals and strength with aging. This can result in serious bone fractures. Your risk for osteoporosis can be identified using a bone density scan.  If you are 74 years of age or older, or if you are at risk for osteoporosis and fractures, ask your health care provider if you should be screened.  Ask your health care provider whether you should take a calcium or vitamin D supplement to lower your risk for osteoporosis.  Menopause may have certain physical symptoms and risks.  Hormone replacement therapy may reduce some of these symptoms and risks. Talk to your health care provider about whether hormone replacement therapy is right for you. Follow these instructions at home:  Schedule regular health, dental, and eye exams.  Stay current with your immunizations.  Do not use any tobacco products including cigarettes, chewing tobacco, or electronic cigarettes.  If you are pregnant, do not drink alcohol.  If you are breastfeeding, limit how much and how often you drink alcohol.  Limit alcohol intake to no more than 1 drink per day for nonpregnant women. One drink equals 12 ounces of beer, 5 ounces of wine, or 1 ounces of hard liquor.  Do not use street drugs.  Do not share needles.  Ask your health care  provider for help if you need support or information about quitting drugs.  Tell your health care provider if you often feel depressed.  Tell your health care provider if you have ever been abused or do not feel safe at home. This information is not intended to replace advice given to you by your health care provider. Make sure you discuss any questions you have with your health care provider. Document Released: 04/06/2011 Document Revised: 02/27/2016 Document Reviewed: 06/25/2015 Elsevier Interactive Patient Education  2018 [employer].     IF you received an x-ray today, you will receive an invoice from [facility]. Please contact [facility] at [phone] with questions or concerns regarding your invoice.   IF you received labwork today, you will receive an invoice from [place]. Please contact [facility] at [phone] with questions or concerns regarding your invoice.   Our billing staff will not be able to assist you with questions regarding bills from these companies.  You will be contacted with the lab results as soon as they are available. The fastest way to get your results is to activate your My Chart account. Instructions are located on the last page of this paperwork. If you have not heard from [place] regarding the results in 2 weeks, please contact this office.

## 2018-05-03 LAB — NICOTINE/COTININE METABOLITES
COTININE: NOT DETECTED ng/mL
Nicotine: NOT DETECTED ng/mL

## 2018-05-03 LAB — LIPID PANEL
CHOL/HDL RATIO: 4 ratio (ref 0.0–4.4)
Cholesterol, Total: 170 mg/dL (ref 100–199)
HDL: 43 mg/dL (ref >39)
LDL Calculated: 101 mg/dL — ABNORMAL HIGH (ref 0–99)
Triglycerides: 128 mg/dL (ref 0–149)
VLDL Cholesterol Cal: 26 mg/dL (ref 5–40)

## 2018-05-03 LAB — CBC WITH DIFFERENTIAL/PLATELET
Basophils Absolute: 0 10*3/uL (ref 0.0–0.2)
Basos: 1 %
EOS (ABSOLUTE): 0.2 10*3/uL (ref 0.0–0.4)
Eos: 3 %
Hematocrit: 38.4 % (ref 34.0–46.6)
Hemoglobin: 13.3 g/dL (ref 11.1–15.9)
Immature Grans (Abs): 0.1 10*3/uL (ref 0.0–0.1)
Immature Granulocytes: 1 %
Lymphocytes Absolute: 0.9 10*3/uL (ref 0.7–3.1)
Lymphs: 18 %
MCH: 32 pg (ref 26.6–33.0)
MCHC: 34.6 g/dL (ref 31.5–35.7)
MCV: 92 fL (ref 79–97)
MONOCYTES: 9 %
MONOS ABS: 0.4 10*3/uL (ref 0.1–0.9)
NEUTROS ABS: 3.4 10*3/uL (ref 1.4–7.0)
Neutrophils: 68 %
Platelets: 293 10*3/uL (ref 150–450)
RBC: 4.16 x10E6/uL (ref 3.77–5.28)
RDW: 13.2 % (ref 12.3–15.4)
WBC: 4.9 10*3/uL (ref 3.4–10.8)

## 2018-05-03 LAB — COMPREHENSIVE METABOLIC PANEL
ALT: 26 IU/L (ref 0–32)
AST: 20 IU/L (ref 0–40)
Albumin/Globulin Ratio: 2.1 (ref 1.2–2.2)
Albumin: 4.1 g/dL (ref 3.5–5.5)
Alkaline Phosphatase: 72 IU/L (ref 39–117)
BUN / CREAT RATIO: 15 (ref 9–23)
BUN: 12 mg/dL (ref 6–24)
Bilirubin Total: 0.6 mg/dL (ref 0.0–1.2)
CALCIUM: 9.2 mg/dL (ref 8.7–10.2)
CO2: 20 mmol/L (ref 20–29)
CREATININE: 0.82 mg/dL (ref 0.57–1.00)
Chloride: 105 mmol/L (ref 96–106)
GFR calc Af Amer: 92 mL/min/{1.73_m2} (ref >59)
GFR, EST NON AFRICAN AMERICAN: 80 mL/min/{1.73_m2} (ref >59)
Globulin, Total: 2 g/dL (ref 1.5–4.5)
Glucose: 97 mg/dL (ref 65–99)
Potassium: 4.5 mmol/L (ref 3.5–5.2)
SODIUM: 142 mmol/L (ref 134–144)
TOTAL PROTEIN: 6.1 g/dL (ref 6.0–8.5)

## 2018-05-03 LAB — HIV ANTIBODY (ROUTINE TESTING W REFLEX): HIV Screen 4th Generation wRfx: NONREACTIVE

## 2018-05-03 LAB — HEPATITIS C ANTIBODY

## 2018-05-03 LAB — HEMOGLOBIN A1C
Est. average glucose Bld gHb Est-mCnc: 117 mg/dL
Hgb A1c MFr Bld: 5.7 % — ABNORMAL HIGH (ref 4.8–5.6)

## 2018-05-03 LAB — TSH: TSH: 1.67 u[IU]/mL (ref 0.450–4.500)

## 2018-05-04 ENCOUNTER — Telehealth: Payer: Self-pay | Admitting: Urgent Care

## 2018-05-04 NOTE — Telephone Encounter (Signed)
Pt brought corrected form in for [facility] to complete   Best phone for pt is [phone]

## 2018-05-05 NOTE — Telephone Encounter (Signed)
Paper work in box please complete.

## 2018-05-08 ENCOUNTER — Encounter: Payer: Self-pay | Admitting: Urgent Care

## 2018-05-09 ENCOUNTER — Encounter: Payer: Self-pay | Admitting: Internal Medicine

## 2018-05-09 NOTE — Telephone Encounter (Signed)
I will complete forms on 05/10/2018 when I return to the clinic.

## 2018-05-24 NOTE — Telephone Encounter (Signed)
Copied from [place] [id number]. Topic: General - Other >> May 24, 2018  9:32 AM Judyann Munson wrote: Reason for CRM: Patient is calling to state she will need to pick up her lab result for her insurance. She stated they have not received the fax. Please advise

## 2018-05-30 ENCOUNTER — Other Ambulatory Visit: Payer: Self-pay | Admitting: Urgent Care

## 2018-05-30 DIAGNOSIS — J4540 Moderate persistent asthma, uncomplicated: Secondary | ICD-10-CM

## 2018-05-30 NOTE — Telephone Encounter (Signed)
symbicort 160-4.5 inhaler refill Last Refill:03/04/18 # 1 with 2 refills Last OV: 05/02/18 PCP: [place]: [place]

## 2018-06-01 ENCOUNTER — Ambulatory Visit
Admission: RE | Admit: 2018-06-01 | Discharge: 2018-06-01 | Disposition: A | Discharge status: Home / Self Care | Payer: BLUE CROSS/BLUE SHIELD | Source: Ambulatory Visit | Attending: Urgent Care | Admitting: Urgent Care

## 2018-06-01 DIAGNOSIS — Z1239 Encounter for other screening for malignant neoplasm of breast: Secondary | ICD-10-CM

## 2018-06-08 ENCOUNTER — Encounter: Payer: Self-pay | Admitting: Internal Medicine

## 2018-06-08 ENCOUNTER — Ambulatory Visit (AMBULATORY_SURGERY_CENTER): Payer: Self-pay | Admitting: *Deleted

## 2018-06-08 VITALS — Ht 66.0 in | Wt 215.0 lb

## 2018-06-08 DIAGNOSIS — Z86010 Personal history of colonic polyps: Secondary | ICD-10-CM

## 2018-06-08 MED ORDER — NA SULFATE-K SULFATE-MG SULF 17.5-3.13-1.6 GM/177ML PO SOLN: ORAL | 0 refills | Status: DC

## 2018-06-08 NOTE — Progress Notes (Signed)
Patient denies any allergies to eggs or soy. Patient denies any problems with anesthesia/sedation. Patient denies any oxygen use at home. Patient denies taking any diet/weight loss medications or blood thinners. EMMI education offered, pt declined. Suprep coupon printed and given to pt.  

## 2018-06-19 ENCOUNTER — Ambulatory Visit (HOSPITAL_COMMUNITY)
Admission: EM | Admit: 2018-06-19 | Discharge: 2018-06-19 | Disposition: A | Discharge status: Home / Self Care | Payer: BLUE CROSS/BLUE SHIELD | Attending: Internal Medicine | Admitting: Internal Medicine

## 2018-06-19 ENCOUNTER — Other Ambulatory Visit: Payer: Self-pay

## 2018-06-19 ENCOUNTER — Encounter (HOSPITAL_COMMUNITY): Payer: Self-pay | Admitting: Emergency Medicine

## 2018-06-19 DIAGNOSIS — R059 Cough, unspecified: Secondary | ICD-10-CM

## 2018-06-19 DIAGNOSIS — J0100 Acute maxillary sinusitis, unspecified: Secondary | ICD-10-CM | POA: Diagnosis not present

## 2018-06-19 DIAGNOSIS — R05 Cough: Secondary | ICD-10-CM

## 2018-06-19 DIAGNOSIS — R030 Elevated blood-pressure reading, without diagnosis of hypertension: Secondary | ICD-10-CM

## 2018-06-19 MED ORDER — DOXYCYCLINE HYCLATE 100 MG PO CAPS: 100.0000 mg | ORAL_CAPSULE | Freq: Two times a day (BID) | ORAL | 0 refills | Status: DC

## 2018-06-19 NOTE — Discharge Instructions (Signed)
Declines chest x-ray today Get plenty of rest and push fluids Use OTC medication as needed for symptomatic relief Take antibiotic as directed and to completion Follow up with PCP if symptoms persist Return or go to ER if you have any new or worsening symptoms   Blood pressure elevated in office.  Please recheck in 24 hours.  If it continues to be greater than 140/90 please follow up with PCP for further evaluation and management.

## 2018-06-19 NOTE — ED Triage Notes (Signed)
Pt reports a cough for over one week.  She reports that it has developed into a productive cough in the last two days.

## 2018-06-19 NOTE — ED Provider Notes (Signed)
[place]   [id number] 06/19/18 Arrival Time: 1000   CC: cough and sinus pressure SUBJECTIVE:  Tricia Bowen is a 57 y.o. female hx significant for allergies, anemia, arthritis, asthma, and GERD who presents with sinus pressure/pain and intermittent cough for 10 days ago.  Past 1-2 days productive cough with yellow sputum.  Admits positive sick exposure. Has tried tea/honey, benadryl, zyrtec, and albuterol inhaler with minimal relief.  Symptoms are made worse with eating certain foods.  Reports previous symptoms in the past.   Complains of fatigue, mild sore throat, SOB, wheezing, and dental pain.   Denies fever, chills, rhinorrhea, chest pain, nausea, changes in bowel or bladder habits.    ROS: As per HPI.  Past Medical History:  Diagnosis Date  . Allergy   . Anemia   . Arthritis    neck  . Asthma   . GERD (gastroesophageal reflux disease)    Past Surgical History:  Procedure Laterality Date  . COLONOSCOPY  2015   Dr.Pyrtle  . CYST REMOVAL LEG Right 1979   foot  . LIPOMA EXCISION Left 2011   hip  . POLYPECTOMY    . TONSILLECTOMY  1980  . TUBAL LIGATION  1985   Allergies  Allergen Reactions  . Penicillins Anaphylaxis   No current facility-administered medications on file prior to encounter.    Current Outpatient Medications on File Prior to Encounter  Medication Sig Dispense Refill  . diphenhydrAMINE (BENADRYL) 25 MG tablet Take 25 mg by mouth every 6 (six) hours as needed.    . fluticasone (FLONASE) 50 MCG/ACT nasal spray Place 2 sprays into both nostrils daily.    . SYMBICORT 160-4.5 MCG/ACT inhaler INHALE 2 PUFFS BY MOUTH TWICE DAILY 1 Inhaler 2  . cetirizine (ZYRTEC) 10 MG tablet Take 10 mg by mouth daily.    Marland Kitchen ibuprofen (ADVIL,MOTRIN) 600 MG tablet Take 1 tablet (600 mg total) by mouth 3 (three) times daily. (Patient not taking: Reported on 06/08/2018) 15 tablet 0  . Na Sulfate-K Sulfate-Mg Sulf 17.5-3.13-1.6 GM/177ML SOLN Suprep (no substitutions)-TAKE AS  DIRECTED. 354 mL 0  . UNABLE TO FIND Med Name: CBD oil po daily      Social History   Socioeconomic History  . Marital status: Married    Spouse name: Juanda Crumble  . Number of children: 2  . Years of education: Not on file  . Highest education level: Not on file  Occupational History  . Not on file  Social Needs  . Financial resource strain: Not on file  . Food insecurity:    Worry: Not on file    Inability: Not on file  . Transportation needs:    Medical: Not on file    Non-medical: Not on file  Tobacco Use  . Smoking status: Former Smoker    Types: Cigarettes    Last attempt to quit: 06/05/2008    Years since quitting: 10.0  . Smokeless tobacco: Never Used  Substance and Sexual Activity  . Alcohol use: Yes    Alcohol/week: 3.0 standard drinks    Types: 3 Glasses of wine per week  . Drug use: No  . Sexual activity: Yes    Birth control/protection: None  Lifestyle  . Physical activity:    Days per week: Not on file    Minutes per session: Not on file  . Stress: Not on file  Relationships  . Social connections:    Talks on phone: Not on file    Gets together: Not on file  Attends religious service: Not on file    Active member of club or organization: Not on file    Attends meetings of clubs or organizations: Not on file    Relationship status: Not on file  . Intimate partner violence:    Fear of current or ex partner: Not on file    Emotionally abused: Not on file    Physically abused: Not on file    Forced sexual activity: Not on file  Other Topics Concern  . Not on file  Social History Narrative  . Not on file   Family History  Problem Relation Age of Onset  . Alzheimer's disease Mother   . Colon polyps Mother 98  . Heart disease Father   . Heart disease Maternal Grandmother   . Heart disease Maternal Grandfather   . Alzheimer's disease Paternal Grandmother   . Heart disease Paternal Grandfather   . Colon cancer Neg Hx   . Esophageal cancer Neg Hx   .  Rectal cancer Neg Hx   . Stomach cancer Neg Hx      OBJECTIVE:  Vitals:   06/19/18 1019 06/19/18 1026  BP: (!) 149/101 (!) 138/98  Pulse: 80   Resp: 16   Temp: 98.1 F (36.7 C)   TempSrc: Oral   SpO2: 97%      General appearance: AOx3 in no acute distress; appears fatigued HEENT: Ears: EACs clear, TMs pearly gray; Eyes: PERRL.  EOM grossly intact.  Maxillary sinus tenderness;  Nose: no obvious rhinorrhea; tonsils nonerythematous, uvula midline Neck: supple without LAD Lungs: clear to auscultation bilaterally without adventitious breath sounds Heart: regular rate and rhythm.  Radial pulses 2+ symmetrical bilaterally Skin: warm and dry Psychological: alert and cooperative; normal mood and affect  ASSESSMENT & PLAN:  1. Acute non-recurrent maxillary sinusitis   2. Cough   3. Elevated blood pressure reading     Meds ordered this encounter  Medications  . doxycycline (VIBRAMYCIN) 100 MG capsule    Sig: Take 1 capsule (100 mg total) by mouth 2 (two) times daily.    Dispense:  20 capsule    Refill:  0    Order Specific Question:   Supervising Provider    Answer:   Wynona Luna [[id number]]   Declines chest x-ray today Get plenty of rest and push fluids Use OTC medication as needed for symptomatic relief Take antibiotic as directed and to completion Follow up with PCP if symptoms persist Return or go to ER if you have any new or worsening symptoms   Blood pressure elevated in office.  Please recheck in 24 hours.  If it continues to be greater than 140/90 please follow up with PCP for further evaluation and management.    Reviewed expectations re: course of current medical issues. Questions answered. Outlined signs and symptoms indicating need for more acute intervention. Patient verbalized understanding. After Visit Summary given.          Lestine Box, PA-C 06/19/18 1046

## 2018-06-20 ENCOUNTER — Telehealth: Payer: Self-pay | Admitting: Internal Medicine

## 2018-06-20 NOTE — Telephone Encounter (Signed)
Pt states she was diagnosed with a sinus infection and started antibiotic yesterday- she has lots of congestion and drainage, some cough but NO fever- she states she does not feel well at all- She was instructed to continue her abx as directed , drink lots of fluids, keep a check on her temperature( get thermometer if does not have one ) and call us tomorrow if she still feels terrible- instructed the abx will not interfere with her colon, but if she has no improvement, she can call and RS Tuesday  Pt verbalized understanding   Lelan Pons PV

## 2018-06-20 NOTE — Telephone Encounter (Signed)
Patient states she went to the ER and has a sinus infection. Patient is now on antibiotics and wants to know if it is still okay to have colon this Wednesday 9.18.19. Pt had pre visit on 9.4.19.

## 2018-06-22 ENCOUNTER — Ambulatory Visit (AMBULATORY_SURGERY_CENTER): Payer: BLUE CROSS/BLUE SHIELD | Admitting: Internal Medicine

## 2018-06-22 ENCOUNTER — Encounter: Payer: Self-pay | Admitting: Internal Medicine

## 2018-06-22 VITALS — BP 126/77 | HR 76 | Temp 98.4°F | Resp 13 | Ht 66.0 in | Wt 215.0 lb

## 2018-06-22 DIAGNOSIS — Z86010 Personal history of colonic polyps: Secondary | ICD-10-CM

## 2018-06-22 MED ORDER — SODIUM CHLORIDE 0.9 % IV SOLN: 500.0000 mL | Freq: Once | INTRAVENOUS | Status: DC

## 2018-06-22 NOTE — Progress Notes (Signed)
Patient very vague regarding when she took her medications.

## 2018-06-22 NOTE — Progress Notes (Signed)
To recovery, report given to RN, VSS

## 2018-06-22 NOTE — Patient Instructions (Signed)
YOU HAD AN ENDOSCOPIC PROCEDURE TODAY AT [facility]:   Refer to the procedure report that was given to you for any specific questions about what was found during the examination.  If the procedure report does not answer your questions, please call your gastroenterologist to clarify.  If you requested that your care partner not be given the details of your procedure findings, then the procedure report has been included in a sealed envelope for you to review at your convenience later.  YOU SHOULD EXPECT: Some feelings of bloating in the abdomen. Passage of more gas than usual.  Walking can help get rid of the air that was put into your GI tract during the procedure and reduce the bloating. If you had a lower endoscopy (such as a colonoscopy or flexible sigmoidoscopy) you may notice spotting of blood in your stool or on the toilet paper. If you underwent a bowel prep for your procedure, you may not have a normal bowel movement for a few days.  Please Note:  You might notice some irritation and congestion in your nose or some drainage.  This is from the oxygen used during your procedure.  There is no need for concern and it should clear up in a day or so.  SYMPTOMS TO REPORT IMMEDIATELY:   Following lower endoscopy (colonoscopy or flexible sigmoidoscopy):  Excessive amounts of blood in the stool  Significant tenderness or worsening of abdominal pains  Swelling of the abdomen that is new, acute  Fever of 100F or higher  For urgent or emergent issues, a gastroenterologist can be reached at any hour by calling [phone].   DIET:  We do recommend a small meal at first, but then you may proceed to your regular diet.  Drink plenty of fluids but you should avoid alcoholic beverages for 24 hours.  ACTIVITY:  You should plan to take it easy for the rest of today and you should NOT DRIVE or use heavy machinery until tomorrow (because of the sedation medicines used during the test).     FOLLOW UP: Our staff will call the number listed on your records the next business day following your procedure to check on you and address any questions or concerns that you may have regarding the information given to you following your procedure. If we do not reach you, we will leave a message.  However, if you are feeling well and you are not experiencing any problems, there is no need to return our call.  We will assume that you have returned to your regular daily activities without incident.  If any biopsies were taken you will be contacted by phone or by letter within the next 1-3 weeks.  Please call us at [phone] if you have not heard about the biopsies in 3 weeks.    SIGNATURES/CONFIDENTIALITY: You and/or your care partner have signed paperwork which will be entered into your electronic medical record.  These signatures attest to the fact that that the information above on your After Visit Summary has been reviewed and is understood.  Full responsibility of the confidentiality of this discharge information lies with you and/or your care-partner.  Diverticulosis, and hemorrhoid information given.  Recall 5 yers-2024

## 2018-06-22 NOTE — Progress Notes (Signed)
Pt's states no medical or surgical changes since previsit or office visit. 

## 2018-06-22 NOTE — Op Note (Signed)
[facility] Patient Name: Tricia Bowen Procedure Date: 06/22/2018 3:27 PM MRN: [id number] Endoscopist: Jerene Bears , MD Age: 57 Referring MD:  Date of Birth: 1961-01-01 Gender: Female Account #: [id number] Procedure:                Colonoscopy Indications:              High risk colon cancer surveillance: Personal                            history of multiple (3 or more) adenomas, Last                            colonoscopy: 2015 Medicines:                Monitored Anesthesia Care Procedure:                Pre-Anesthesia Assessment:                           - Prior to the procedure, a History and Physical                            was performed, and patient medications and                            allergies were reviewed. The patient's tolerance of                            previous anesthesia was also reviewed. The risks                            and benefits of the procedure and the sedation                            options and risks were discussed with the patient.                            All questions were answered, and informed consent                            was obtained. Prior Anticoagulants: The patient has                            taken no previous anticoagulant or antiplatelet                            agents. ASA Grade Assessment: II - A patient with                            mild systemic disease. After reviewing the risks                            and benefits, the patient was deemed in  satisfactory condition to undergo the procedure.                           After obtaining informed consent, the colonoscope                            was passed under direct vision. Throughout the                            procedure, the patient's blood pressure, pulse, and                            oxygen saturations were monitored continuously. The                            Colonoscope was introduced through the anus and                        advanced to the cecum, identified by appendiceal                            orifice and ileocecal valve. The colonoscopy was                            performed without difficulty. The patient tolerated                            the procedure well. The quality of the bowel                            preparation was good. The ileocecal valve,                            appendiceal orifice, and rectum were photographed. Scope In: 3:32:18 PM Scope Out: 3:43:21 PM Scope Withdrawal Time: 0 hours 8 minutes 33 seconds  Total Procedure Duration: 0 hours 11 minutes 3 seconds  Findings:                 The digital rectal exam was normal.                           A few small-mouthed diverticula were found in the                            sigmoid colon.                           Internal hemorrhoids were found during                            retroflexion. The hemorrhoids were small.                           The exam was otherwise without abnormality. Complications:            No immediate complications. Estimated Blood Loss:  Estimated blood loss: none. Impression:               - Diverticulosis in the sigmoid colon.                           - Internal hemorrhoids.                           - The examination was otherwise normal.                           - No specimens collected. Recommendation:           - Patient has a contact number available for                            emergencies. The signs and symptoms of potential                            delayed complications were discussed with the                            patient. Return to normal activities tomorrow.                            Written discharge instructions were provided to the                            patient.                           - Resume previous diet.                           - Continue present medications.                           - Repeat colonoscopy in 5 years for surveillance. Jerene Bears, MD 06/22/2018 3:46:52 PM This report has been signed electronically.

## 2018-06-23 ENCOUNTER — Telehealth: Payer: Self-pay

## 2018-06-23 NOTE — Telephone Encounter (Signed)
  Follow up Call-  Call back number 06/22/2018  Post procedure Call Back phone  # [phone]  Permission to leave phone message Yes  Some recent data might be hidden     Patient questions:  Do you have a fever, pain , or abdominal swelling? No. Pain Score  0 *  Have you tolerated food without any problems? Yes.    Have you been able to return to your normal activities? Yes.    Do you have any questions about your discharge instructions: Diet   No. Medications  No. Follow up visit  No.  Do you have questions or concerns about your Care? No.  Actions: * If pain score is 4 or above: No action needed, pain <4.

## 2018-11-04 ENCOUNTER — Other Ambulatory Visit: Payer: Self-pay | Admitting: Emergency Medicine

## 2018-11-04 ENCOUNTER — Other Ambulatory Visit: Payer: Self-pay | Admitting: Urgent Care

## 2018-11-04 NOTE — Telephone Encounter (Signed)
Requested medication (s) are due for refill today: yes  Requested medication (s) are on the active medication list: yes  Last refill:  Last refilled by historical provider  Future visit scheduled: no  Notes to clinic:  Last filled by historical provider    Requested Prescriptions  Pending Prescriptions Disp Refills   albuterol (PROVENTIL HFA;VENTOLIN HFA) 108 (90 Base) MCG/ACT inhaler      Sig: Inhale into the lungs every 6 (six) hours as needed for wheezing or shortness of breath.     Pulmonology:  Beta Agonists Failed - 11/04/2018  5:22 PM      Failed - One inhaler should last at least one month. If the patient is requesting refills earlier, contact the patient to check for uncontrolled symptoms.      Passed - Valid encounter within last 12 months    Recent Outpatient Visits          6 months ago Annual physical exam   [facility] Care at [place], [place]   6 months ago Diarrhea, unspecified type   Primary Care at [place], [place]   11 months ago Urinary retention   [facility] Care at [place], [place] D, PA-C   1 year ago Elevated blood pressure reading without diagnosis of hypertension   Primary Care at [place], [place]   1 year ago Seasonal allergic rhinitis due to other allergic trigger   Primary Care at [place], [place]

## 2018-11-04 NOTE — Telephone Encounter (Signed)
Copied from [place] [id number]. Topic: Quick Communication - Rx Refill/Question >> Nov 04, 2018  5:06 PM Blase Mess A wrote: Medication: albuterol (PROVENTIL HFA;VENTOLIN HFA) 108 (90 Base) MCG/ACT inhaler [[id number]]   Has the patient contacted their pharmacy? Yes  (Agent: If no, request that the patient contact the pharmacy for the refill.) (Agent: If yes, when and what did the pharmacy advise?)  Preferred Pharmacy (with phone number or street name): [facility], [place] [phone] (Phone) [phone] (Fax)    Agent: Please be advised that RX refills may take up to 3 business days. We ask that you follow-up with your pharmacy.

## 2018-11-05 ENCOUNTER — Other Ambulatory Visit: Payer: Self-pay | Admitting: Urgent Care

## 2018-11-05 NOTE — Telephone Encounter (Signed)
Please advise on refill. Pt was last seen 04/2018 with Bess Harvest for CPE.  Pt is requesting refill on Albuterol INH  Thanks, Guadelupe Sabin

## 2018-11-06 MED ORDER — ALBUTEROL SULFATE HFA 108 (90 BASE) MCG/ACT IN AERS: 2.0000 | INHALATION_SPRAY | Freq: Four times a day (QID) | RESPIRATORY_TRACT | 2 refills | Status: DC | PRN

## 2018-11-16 ENCOUNTER — Ambulatory Visit: Payer: BLUE CROSS/BLUE SHIELD | Admitting: Family Medicine

## 2018-11-16 ENCOUNTER — Other Ambulatory Visit: Payer: Self-pay

## 2018-11-16 ENCOUNTER — Encounter: Payer: Self-pay | Admitting: Family Medicine

## 2018-11-16 VITALS — BP 122/79 | HR 85 | Temp 99.0°F | Resp 20 | Ht 65.55 in | Wt 223.4 lb

## 2018-11-16 DIAGNOSIS — F4323 Adjustment disorder with mixed anxiety and depressed mood: Secondary | ICD-10-CM | POA: Diagnosis not present

## 2018-11-16 DIAGNOSIS — J019 Acute sinusitis, unspecified: Secondary | ICD-10-CM | POA: Diagnosis not present

## 2018-11-16 DIAGNOSIS — H6983 Other specified disorders of Eustachian tube, bilateral: Secondary | ICD-10-CM

## 2018-11-16 DIAGNOSIS — R0982 Postnasal drip: Secondary | ICD-10-CM

## 2018-11-16 DIAGNOSIS — B9689 Other specified bacterial agents as the cause of diseases classified elsewhere: Secondary | ICD-10-CM

## 2018-11-16 DIAGNOSIS — R0981 Nasal congestion: Secondary | ICD-10-CM

## 2018-11-16 MED ORDER — PREDNISONE 20 MG PO TABS: 40.0000 mg | ORAL_TABLET | Freq: Every day | ORAL | 0 refills | Status: AC

## 2018-11-16 MED ORDER — AZITHROMYCIN 250 MG PO TABS: ORAL_TABLET | ORAL | 0 refills | Status: DC

## 2018-11-16 NOTE — Progress Notes (Signed)
Established Patient Office Visit  Subjective:  Patient ID: Tricia Bowen, female    DOB: 01-01-1961  Age: 58 y.o. MRN: [id number]  CC:  Chief Complaint  Patient presents with  . Nasal Congestion    X 2 weeks- bloody mucus/ chest congestion/headache  . Sore Throat    X 4 days and hoarse  . Depression    score 12  . Anxiety    score 6    HPI Tricia Bowen presents for   Patient reports that she has been having congestion and sore throat She is feeling hoarse She states that she has postnasal drip  Yesterday she had bloody nasal discharge in the mucus She also has pain in the teeth and face She reports headaches in the jaws She has a history of asthma and reports that her asthma is mild intermittent She states that she uses symbicort daily  Depression screen [facility] 2/9 11/16/2018 05/02/2018 04/26/2018 11/15/2017 04/29/2017  Decreased Interest 1 0 0 0 0  Down, Depressed, Hopeless 1 0 0 0 0  PHQ - 2 Score 2 0 0 0 0  Altered sleeping 2 - - - -  Tired, decreased energy 3 - - - -  Change in appetite 3 - - - -  Feeling bad or failure about yourself  2 - - - -  Trouble concentrating 0 - - - -  Moving slowly or fidgety/restless 0 - - - -  Suicidal thoughts 0 - - - -  PHQ-9 Score 12 - - - -  Difficult doing work/chores Somewhat difficult - - - -    GAD 7 : Generalized Anxiety Score 11/16/2018  Nervous, Anxious, on Edge 1  Control/stop worrying 1  Worry too much - different things 1  Trouble relaxing 2  Restless 0  Easily annoyed or irritable 0  Afraid - awful might happen 1  Total GAD 7 Score 6  Anxiety Difficulty Somewhat difficult    She states that she does not have a known history of anxiety and depression She denies suicidal thoughts She states that she has had family problems which is worsening her mood and making her more anxious than normal She states that she has trouble sleeping  She states that she gets through her days and that this is something that she can live  with She denies any interest in counseling or medications   Past Medical History:  Diagnosis Date  . Allergy   . Anemia   . Arthritis    neck  . Asthma   . GERD (gastroesophageal reflux disease)     Past Surgical History:  Procedure Laterality Date  . COLONOSCOPY  2015   Dr.Pyrtle  . CYST REMOVAL LEG Right 1979   foot  . LIPOMA EXCISION Left 2011   hip  . POLYPECTOMY    . TONSILLECTOMY  1980  . TUBAL LIGATION  1985    Family History  Problem Relation Age of Onset  . Alzheimer's disease Mother   . Colon polyps Mother 72  . Heart disease Father   . Heart disease Maternal Grandmother   . Heart disease Maternal Grandfather   . Alzheimer's disease Paternal Grandmother   . Heart disease Paternal Grandfather   . Colon cancer Neg Hx   . Esophageal cancer Neg Hx   . Rectal cancer Neg Hx   . Stomach cancer Neg Hx     Social History   Socioeconomic History  . Marital status: Married    Spouse name: Juanda Crumble  .  Number of children: 2  . Years of education: Not on file  . Highest education level: Not on file  Occupational History  . Not on file  Social Needs  . Financial resource strain: Not on file  . Food insecurity:    Worry: Not on file    Inability: Not on file  . Transportation needs:    Medical: Not on file    Non-medical: Not on file  Tobacco Use  . Smoking status: Former Smoker    Types: Cigarettes    Last attempt to quit: 06/05/2008    Years since quitting: 10.4  . Smokeless tobacco: Never Used  Substance and Sexual Activity  . Alcohol use: Yes    Alcohol/week: 3.0 standard drinks    Types: 3 Glasses of wine per week  . Drug use: No  . Sexual activity: Yes    Birth control/protection: None  Lifestyle  . Physical activity:    Days per week: Not on file    Minutes per session: Not on file  . Stress: Not on file  Relationships  . Social connections:    Talks on phone: Not on file    Gets together: Not on file    Attends religious service: Not on  file    Active member of club or organization: Not on file    Attends meetings of clubs or organizations: Not on file    Relationship status: Not on file  . Intimate partner violence:    Fear of current or ex partner: Not on file    Emotionally abused: Not on file    Physically abused: Not on file    Forced sexual activity: Not on file  Other Topics Concern  . Not on file  Social History Narrative  . Not on file    Outpatient Medications Prior to Visit  Medication Sig Dispense Refill  . albuterol (PROVENTIL HFA;VENTOLIN HFA) 108 (90 Base) MCG/ACT inhaler Inhale 2 puffs into the lungs every 6 (six) hours as needed for wheezing or shortness of breath. 18 g 2  . diphenhydrAMINE (BENADRYL) 25 MG tablet Take 25 mg by mouth every 6 (six) hours as needed.    . fluticasone (FLONASE) 50 MCG/ACT nasal spray Place 2 sprays into both nostrils daily.    Marland Kitchen ibuprofen (ADVIL,MOTRIN) 600 MG tablet Take 1 tablet (600 mg total) by mouth 3 (three) times daily. 15 tablet 0  . SYMBICORT 160-4.5 MCG/ACT inhaler INHALE 2 PUFFS BY MOUTH TWICE DAILY 1 Inhaler 2  . UNABLE TO FIND Med Name: CBD oil po daily    . cetirizine (ZYRTEC) 10 MG tablet Take 10 mg by mouth daily.    Marland Kitchen doxycycline (VIBRAMYCIN) 100 MG capsule Take 1 capsule (100 mg total) by mouth 2 (two) times daily. (Patient not taking: Reported on 11/16/2018) 20 capsule 0   No facility-administered medications prior to visit.     Allergies  Allergen Reactions  . Penicillins Anaphylaxis    ROS Review of Systems See hpi   Objective:    Physical Exam  BP 122/79   Pulse 85   Temp 99 F (37.2 C) (Oral)   Resp 20   Ht 5' 5.55" (1.665 m)   Wt 223 lb 6.4 oz (101.3 kg)   LMP 08/07/2013   SpO2 94%   BMI 36.55 kg/m  Wt Readings from Last 3 Encounters:  11/16/18 223 lb 6.4 oz (101.3 kg)  06/22/18 215 lb (97.5 kg)  06/08/18 215 lb (97.5 kg)   General: alert,  oriented, in NAD Head: normocephalic, atraumatic, no sinus tenderness Eyes: EOM  intact, no scleral icterus or conjunctival injection Ears: TM clear bilaterally bulging bilaterally Nose: mucosa nonerythematous, nonedematous Throat: no pharyngeal exudate or erythema Lymph: no posterior auricular, submental or cervical lymph adenopathy Heart: normal rate, normal sinus rhythm, no murmurs Lungs: clear to auscultation bilaterally, no wheezing    Health Maintenance Due  Topic Date Due  . INFLUENZA VACCINE  05/05/2018    There are no preventive care reminders to display for this patient.  Lab Results  Component Value Date   TSH 1.670 05/02/2018   Lab Results  Component Value Date   WBC 4.9 05/02/2018   HGB 13.3 05/02/2018   HCT 38.4 05/02/2018   MCV 92 05/02/2018   PLT 293 05/02/2018   Lab Results  Component Value Date   NA 142 05/02/2018   K 4.5 05/02/2018   CO2 20 05/02/2018   GLUCOSE 97 05/02/2018   BUN 12 05/02/2018   CREATININE 0.82 05/02/2018   BILITOT 0.6 05/02/2018   ALKPHOS 72 05/02/2018   AST 20 05/02/2018   ALT 26 05/02/2018   PROT 6.1 05/02/2018   ALBUMIN 4.1 05/02/2018   CALCIUM 9.2 05/02/2018   ANIONGAP 13 08/08/2014   Lab Results  Component Value Date   CHOL 170 05/02/2018   Lab Results  Component Value Date   HDL 43 05/02/2018   Lab Results  Component Value Date   LDLCALC 101 (H) 05/02/2018   Lab Results  Component Value Date   TRIG 128 05/02/2018   Lab Results  Component Value Date   CHOLHDL 4.0 05/02/2018   Lab Results  Component Value Date   HGBA1C 5.7 (H) 05/02/2018      Assessment & Plan:   Problem List Items Addressed This Visit    None    Visit Diagnoses    Acute bacterial sinusitis    -  Primary pcn allergy so will use zpak   Relevant Medications   azithromycin (ZITHROMAX) 250 MG tablet   predniSONE (DELTASONE) 20 MG tablet   Sinus congestion    -  Discussed that she has sinus congestion as well    Relevant Medications   predniSONE (DELTASONE) 20 MG tablet   Postnasal drip    - discussed nasal  steroid    Relevant Medications   predniSONE (DELTASONE) 20 MG tablet   Eustachian tube dysfunction, bilateral    - advised flonase, benadryl, and prednisone   Relevant Medications   predniSONE (DELTASONE) 20 MG tablet    Anxiety and depression Offered counseling which patient declined Advised pt to consider meds She declined   Meds ordered this encounter  Medications  . azithromycin (ZITHROMAX) 250 MG tablet    Sig: Take 2 tablets on day 1, one tablet each day after    Dispense:  6 tablet    Refill:  0  . predniSONE (DELTASONE) 20 MG tablet    Sig: Take 2 tablets (40 mg total) by mouth daily with breakfast for 5 days.    Dispense:  10 tablet    Refill:  0    Follow-up: No follow-ups on file.    Forrest Moron, MD

## 2018-11-16 NOTE — Patient Instructions (Signed)

## 2018-12-02 ENCOUNTER — Ambulatory Visit: Payer: BLUE CROSS/BLUE SHIELD | Admitting: Family Medicine

## 2018-12-02 ENCOUNTER — Other Ambulatory Visit: Payer: Self-pay | Admitting: Family Medicine

## 2018-12-02 DIAGNOSIS — Z5321 Procedure and treatment not carried out due to patient leaving prior to being seen by health care provider: Secondary | ICD-10-CM

## 2018-12-02 DIAGNOSIS — J4540 Moderate persistent asthma, uncomplicated: Secondary | ICD-10-CM

## 2018-12-02 MED ORDER — BUDESONIDE-FORMOTEROL FUMARATE 160-4.5 MCG/ACT IN AERO: 2.0000 | INHALATION_SPRAY | Freq: Two times a day (BID) | RESPIRATORY_TRACT | 2 refills | Status: AC

## 2018-12-02 NOTE — Telephone Encounter (Signed)
Albuterol refill available at requested pharmacy.

## 2018-12-02 NOTE — Telephone Encounter (Signed)
Requested Prescriptions  Pending Prescriptions Disp Refills  . budesonide-formoterol (SYMBICORT) 160-4.5 MCG/ACT inhaler 1 Inhaler 2    Sig: Inhale 2 puffs into the lungs 2 (two) times daily.     Pulmonology:  Combination Products Passed - 12/02/2018 11:01 AM      Passed - Valid encounter within last 12 months    Recent Outpatient Visits          2 weeks ago Acute bacterial sinusitis   [facility] Care at [facility], Arlie Solomons, MD   7 months ago Annual physical exam   Primary Care at [place], [place]   7 months ago Diarrhea, unspecified type   Primary Care at [place], [place]   1 year ago Urinary retention   [facility] Care at [place], [place] D, PA-C   1 year ago Elevated blood pressure reading without diagnosis of hypertension   Primary Care at [place], [place]

## 2018-12-02 NOTE — Progress Notes (Signed)
Left w/o being seen

## 2018-12-02 NOTE — Telephone Encounter (Signed)
Copied from [place] [id number]. Topic: Quick Communication - Rx Refill/Question >> Dec 02, 2018 10:25 AM Gustavus Messing wrote: Medication: albuterol (PROVENTIL HFA;VENTOLIN HFA) 108 (90 Base) MCG/ACT inhaler  SYMBICORT 160-4.5 MCG/ACT inhaler    Has the patient contacted their pharmacy? No. (Agent: If no, request that the patient contact the pharmacy for the refill.) One prescription might be filled already but patient is not sure. Does not want to come ine if she does not have to   Preferred Pharmacy (with phone number or street name): [facility], [place] [phone] (Phone) [phone] (Fax)    Agent: Please be advised that RX refills may take up to 3 business days. We ask that you follow-up with your pharmacy.

## 2018-12-05 ENCOUNTER — Ambulatory Visit (INDEPENDENT_AMBULATORY_CARE_PROVIDER_SITE_OTHER): Payer: BLUE CROSS/BLUE SHIELD | Admitting: Family Medicine

## 2018-12-05 ENCOUNTER — Encounter: Payer: Self-pay | Admitting: Family Medicine

## 2018-12-05 VITALS — BP 131/86 | HR 81 | Temp 98.9°F | Ht 65.55 in | Wt 223.6 lb

## 2018-12-05 DIAGNOSIS — J3089 Other allergic rhinitis: Secondary | ICD-10-CM | POA: Diagnosis not present

## 2018-12-05 DIAGNOSIS — J0101 Acute recurrent maxillary sinusitis: Secondary | ICD-10-CM | POA: Diagnosis not present

## 2018-12-05 DIAGNOSIS — J4540 Moderate persistent asthma, uncomplicated: Secondary | ICD-10-CM

## 2018-12-05 MED ORDER — DOXYCYCLINE HYCLATE 100 MG PO TABS: 100.0000 mg | ORAL_TABLET | Freq: Two times a day (BID) | ORAL | 0 refills | Status: AC

## 2018-12-05 NOTE — Progress Notes (Signed)
3/2/20205:33 PM  Tricia Bowen May 29, 1961, 58 y.o. female [id number]  Chief Complaint  Patient presents with  . Follow-up    still feels she has sinus infection, tthe medication taken for 5 days did not seem to work    HPI:   Patient is a 58 y.o. female with past medical history significant for asthma who presents today for sinus infection  Seen on 11/16/2018 - treated with azi and pred given pnc allergy Better but not resolved, till having pressure on left check, teeth pain, worse when she bends over PND and cough better No fever or chills Takes symbicort for asthma, well controlled, has not needed albuterol Taking benadryl as zyrtec not helping Has not been using flonase regularly Allergies are not well controlled, used to be on immunotherapy H/o sinus surgery  Fall Risk  12/05/2018 11/16/2018 05/02/2018 04/26/2018 11/15/2017  Falls in the past year? 0 0 No No No  Number falls in past yr: 0 0 - - -  Injury with Fall? 0 0 - - -  Follow up - Falls evaluation completed - - -     Depression screen [facility] 2/9 12/05/2018 11/16/2018 05/02/2018  Decreased Interest 0 1 0  Down, Depressed, Hopeless 0 1 0  PHQ - 2 Score 0 2 0  Altered sleeping - 2 -  Tired, decreased energy - 3 -  Change in appetite - 3 -  Feeling bad or failure about yourself  - 2 -  Trouble concentrating - 0 -  Moving slowly or fidgety/restless - 0 -  Suicidal thoughts - 0 -  PHQ-9 Score - 12 -  Difficult doing work/chores - Somewhat difficult -    Allergies  Allergen Reactions  . Penicillins Anaphylaxis    Prior to Admission medications   Medication Sig Start Date End Date Taking? Authorizing Provider  albuterol (PROVENTIL HFA;VENTOLIN HFA) 108 (90 Base) MCG/ACT inhaler Inhale 2 puffs into the lungs every 6 (six) hours as needed for wheezing or shortness of breath. 11/06/18  Yes Rutherford Guys, MD  budesonide-formoterol [facility]) 160-4.5 MCG/ACT inhaler Inhale 2 puffs into the lungs 2 (two) times daily. 12/02/18   Yes Rutherford Guys, MD  cetirizine (ZYRTEC) 10 MG tablet Take 10 mg by mouth daily.   Yes [provider]  diphenhydrAMINE (BENADRYL) 25 MG tablet Take 25 mg by mouth every 6 (six) hours as needed.   Yes [provider]  fluticasone (FLONASE) 50 MCG/ACT nasal spray Place 2 sprays into both nostrils daily.   Yes [provider]  ibuprofen (ADVIL,MOTRIN) 600 MG tablet Take 1 tablet (600 mg total) by mouth 3 (three) times daily. 08/08/14  Yes Carmin Muskrat, MD  UNABLE TO FIND Med Name: CBD oil po daily   Yes [provider]    Past Medical History:  Diagnosis Date  . Allergy   . Anemia   . Arthritis    neck  . Asthma   . GERD (gastroesophageal reflux disease)     Past Surgical History:  Procedure Laterality Date  . COLONOSCOPY  2015   Dr.Pyrtle  . CYST REMOVAL LEG Right 1979   foot  . LIPOMA EXCISION Left 2011   hip  . POLYPECTOMY    . TONSILLECTOMY  1980  . TUBAL LIGATION  1985    Social History   Tobacco Use  . Smoking status: Former Smoker    Types: Cigarettes    Last attempt to quit: 06/05/2008    Years since quitting: 10.5  .  Smokeless tobacco: Never Used  Substance Use Topics  . Alcohol use: Yes    Alcohol/week: 3.0 standard drinks    Types: 3 Glasses of wine per week    Family History  Problem Relation Age of Onset  . Alzheimer's disease Mother   . Colon polyps Mother 63  . Heart disease Father   . Heart disease Maternal Grandmother   . Heart disease Maternal Grandfather   . Alzheimer's disease Paternal Grandmother   . Heart disease Paternal Grandfather   . Colon cancer Neg Hx   . Esophageal cancer Neg Hx   . Rectal cancer Neg Hx   . Stomach cancer Neg Hx     ROS Per hpi  OBJECTIVE:  Blood pressure 131/86, pulse 81, temperature 98.9 F (37.2 C), temperature source Oral, height 5' 5.55" (1.665 m), weight 223 lb 9.6 oz (101.4 kg), last menstrual period 08/07/2013, SpO2 92 %. Body mass index is 36.59 kg/m.    Physical Exam Vitals signs and nursing note reviewed.  Constitutional:      Appearance: She is well-developed.  HENT:     Head: Normocephalic and atraumatic.     Right Ear: Hearing, tympanic membrane, ear canal and external ear normal.     Left Ear: Hearing, tympanic membrane, ear canal and external ear normal.     Nose: Rhinorrhea present. Rhinorrhea is purulent.     Right Sinus: No maxillary sinus tenderness or frontal sinus tenderness.     Left Sinus: Maxillary sinus tenderness present. No frontal sinus tenderness.  Eyes:     Conjunctiva/sclera: Conjunctivae normal.     Pupils: Pupils are equal, round, and reactive to light.  Neck:     Musculoskeletal: Neck supple.  Cardiovascular:     Rate and Rhythm: Normal rate and regular rhythm.     Heart sounds: Normal heart sounds. No murmur. No friction rub. No gallop.   Pulmonary:     Effort: Pulmonary effort is normal.     Breath sounds: Normal breath sounds. No wheezing or rales.  Lymphadenopathy:     Cervical: No cervical adenopathy.  Skin:    General: Skin is warm and dry.  Neurological:     Mental Status: She is alert and oriented to person, place, and time.     ASSESSMENT and PLAN  1. Acute recurrent maxillary sinusitis Treating with doxy, discussed daily flonase and saline nasal rinses. Discussed improving allergy control as main trigger. Consider ENT referral  2. Seasonal allergic rhinitis due to other allergic trigger See #1. Patient interested in immunotherapy again.  - Ambulatory referral to Allergy  3. Moderate persistent extrinsic asthma without complication Controlled. Continue current regime.   Other orders - doxycycline (VIBRA-TABS) 100 MG tablet; Take 1 tablet (100 mg total) by mouth 2 (two) times daily.   Return if symptoms worsen or fail to improve.    Rutherford Guys, MD [facility] at [facility] [place], [place] [address] Ph.  [phone] Fax [phone]

## 2018-12-05 NOTE — Patient Instructions (Signed)
° ° ° °  If you have lab work done today you will be contacted with your lab results within the next 2 weeks.  If you have not heard from us then please contact us. The fastest way to get your results is to register for My Chart. ° ° °IF you received an x-ray today, you will receive an invoice from [facility]. Please contact  Radiology at [phone] with questions or concerns regarding your invoice.  ° °IF you received labwork today, you will receive an invoice from [facility]. Please contact [facility] at [phone] with questions or concerns regarding your invoice.  ° °Our billing staff will not be able to assist you with questions regarding bills from these companies. ° °You will be contacted with the lab results as soon as they are available. The fastest way to get your results is to activate your My Chart account. Instructions are located on the last page of this paperwork. If you have not heard from us regarding the results in 2 weeks, please contact this office. °  ° ° ° °

## 2019-01-02 ENCOUNTER — Other Ambulatory Visit: Payer: Self-pay

## 2019-01-02 ENCOUNTER — Encounter: Payer: Self-pay | Admitting: Allergy

## 2019-01-02 ENCOUNTER — Ambulatory Visit: Payer: BLUE CROSS/BLUE SHIELD | Admitting: Allergy

## 2019-01-02 VITALS — BP 144/102 | HR 81 | Temp 97.7°F | Resp 16 | Ht 66.0 in | Wt 225.6 lb

## 2019-01-02 DIAGNOSIS — J302 Other seasonal allergic rhinitis: Secondary | ICD-10-CM | POA: Diagnosis not present

## 2019-01-02 DIAGNOSIS — J3089 Other allergic rhinitis: Secondary | ICD-10-CM

## 2019-01-02 DIAGNOSIS — T781XXD Other adverse food reactions, not elsewhere classified, subsequent encounter: Secondary | ICD-10-CM

## 2019-01-02 DIAGNOSIS — T781XXA Other adverse food reactions, not elsewhere classified, initial encounter: Secondary | ICD-10-CM | POA: Insufficient documentation

## 2019-01-02 DIAGNOSIS — Z8709 Personal history of other diseases of the respiratory system: Secondary | ICD-10-CM

## 2019-01-02 DIAGNOSIS — H1010 Acute atopic conjunctivitis, unspecified eye: Secondary | ICD-10-CM | POA: Insufficient documentation

## 2019-01-02 DIAGNOSIS — J4540 Moderate persistent asthma, uncomplicated: Secondary | ICD-10-CM | POA: Insufficient documentation

## 2019-01-02 DIAGNOSIS — Z880 Allergy status to penicillin: Secondary | ICD-10-CM | POA: Insufficient documentation

## 2019-01-02 MED ORDER — TIOTROPIUM BROMIDE MONOHYDRATE 1.25 MCG/ACT IN AERS: 2.0000 | INHALATION_SPRAY | Freq: Every day | RESPIRATORY_TRACT | 3 refills | Status: AC

## 2019-01-02 NOTE — Progress Notes (Signed)
New Patient Note  RE: Tricia Bowen MRN: [id number] DOB: January 23, 1961 Date of Office Visit: 01/02/2019  Referring provider: Rutherford Guys, MD Primary care provider: Rutherford Guys, MD  Chief Complaint: Allergic Rhinitis  and Asthma  History of Present Illness: I had the pleasure of seeing Tricia Bowen for initial evaluation at the [facility] on 01/02/2019. She is a 58 y.o. female, who is referred here by Rutherford Guys, MD for the evaluation of allergic rhinitis and asthma. Patient was seen in our office in 2014 for allergic rhinitis and asthma.   Rhinitis: She reports symptoms of sneezing, PND, itchy nose, itchy/watery eyes. Symptoms have been going on for 50+ years. The symptoms are present all year around with worsening in spring and fall. Anosmia: diminished sense of smell. Headache: yes. She has used zytrec, Claritin, benadryl, fluticasone with some improvement in symptoms. Singulair caused anxiety. Sinus infections: 2 recently. Previous work up includes: 2014 skin testing was positive to grass, weed, ragweed, trees, mold, dust mites, cat, dog, horse. Patient used to be on allergy injections in [facility] for 5 years with good benefit.  Previous ENT evaluation: yes patient had sinus surgery over 10 years ago. Previous sinus imaging: none.  Asthma: She reports symptoms of chest tightness, shortness of breath, wheezing for 45 years. Current medications include Symbicort 160 2 puffs BID x many years and albuterol prn which help. She reports not using aerochamber with asthma inhalers. She tried the following inhalers: not sure. Main asthma triggers are allergies, pet exposure. In the last month, frequency of asthma symptoms: daily. Frequency of nocturnal symptoms: 0x/month. Frequency of SABA use: daily. Interference with physical activity: no. Sleep is undisturbed. In the last 12 months, emergency room visits/urgent care visits/doctor office visits or hospitalizations due to  asthma: none. In the last 12 months, oral steroids courses: none. Lifetime history of hospitalization for asthma: no. Prior intubations: no. Asthma was diagnosed at age teens. History of pneumonia: yes 3-4 years ago. She was evaluated by allergist in the past. Smoking exposure: quit in 2012, smoked on and off during adulthood 1/2 to 1 pack per day or so. Up to date with flu vaccine: no.  Assessment and Plan: Lekita is a 58 y.o. female with: Not well controlled moderate persistent asthma Diagnosed with asthma as a teen. Currently on Symbicort 160 2 puffs BID and lately using albuterol on a daily basis. Due to current COVID-19 pandemic we have been limiting spirometries in the office and will obtain at the next visit. However due to current clinical symptoms her asthma is not well controlled.  Daily controller medication(s): continue Symbicort 160 2 puffs twice a day with spacer and demonstrated proper use.  Spacer was given in the office. o START Spiriva 2 puffs daily. Demonstrated proper use and sample given.  Prior to physical activity: May use albuterol rescue inhaler 2 puffs 5 to 15 minutes prior to strenuous physical activities.  Rescue medications: May use albuterol rescue inhaler 2 puffs or nebulizer every 4 to 6 hours as needed for shortness of breath, chest tightness, coughing, and wheezing. Monitor frequency of use.   Get spirometry at next visit.  Seasonal and perennial allergic rhinoconjunctivitis Perennial rhinoconjunctivitis symptoms for the past 50+ years with worsening the spring. 2014 skin testing was positive to grass, weed, ragweed, tree, mold, dust mites, cat, dog and horse.  Singulair caused anxiety in the past.  Unable to skin test today due to recent antihistamine intake.  Get  blood work as below and will make additional recommendations based on results.  Continue environmental control measures.  May use over the counter antihistamines such as Zyrtec (cetirizine),  Claritin (loratadine), Allegra (fexofenadine), or Xyzal (levocetirizine) daily as needed.  May take it twice a day if needed.   Continue Flonase 2 sprays daily.  Nasal saline spray (i.e., Simply Saline) or nasal saline lavage (i.e., NeilMed) is recommended as needed and prior to medicated nasal sprays.  If above regimen does not control symptoms then will start on allergy immunotherapy.  Pollen-food allergy Perioral pruritus with melons and certain seasons cause similar symptoms with fresh apples as well.  Discussed that her food triggered oral and throat symptoms are likely caused by oral food allergy syndrome (OFAS). This is caused by cross reactivity of pollen with fresh fruits and vegetables, and nuts. Symptoms are usually localized in the form of itching and burning in mouth and throat. Very rarely it can progress to more severe symptoms. Eating foods in cooked or processed forms usually minimizes symptoms. I recommended avoidance of eating the problem foods, especially during the peak season(s). Sometimes, OFAS can induce severe throat swelling or even a systemic reaction; with such instance, I advised them to report to a local ER.  History of frequent upper respiratory infection Frequent sinus infections and history of pneumonia in the past.  Patient had sinus surgery about 10 years ago.  No recent ENT evaluation.  Continue to keep track of infections.  Get blood work as below to look at basic immune system.  If still having persistent sinus infections I recommend ENT referral as well.  Penicillin allergy History of facial swelling after penicillin ingestion.  Continue avoidance for now and consider skin testing in future.  Return in about 2 months (around 03/04/2019).  Meds ordered this encounter  Medications   Tiotropium Bromide Monohydrate (SPIRIVA RESPIMAT) 1.25 MCG/ACT AERS    Sig: Inhale 2 puffs into the lungs daily.    Dispense:  1 Inhaler    Refill:  3    Lab  Orders     Allergens w/Total IgE Area 2     IgG, IgA, IgM     Strep pneumoniae 23 Serotypes IgG     CBC With Differential     Diphtheria / Tetanus Antibody Panel  Other allergy screening: Asthma: yes Rhino conjunctivitis: yes Food allergy: yes  Melons cause perioral pruritus Apples as well.  Medication allergy: yes  Penicillin - facial swelling Singulair - anxiety Hymenoptera allergy: no Urticaria: no Eczema: yes History of recurrent infections suggestive of immunodeficency: no  Patient has history multiple infections including sinus infection, pneumonia. Denies frequent ear infections, GI infections/diarrhea, skin infections/abscesses. Patient also has no history of opportunistic infections including fungal infections, viral infections.   Patient reports 2 antibiotic use in the last 12 months and 0 hospital admissions. Patient does not have any secondary causes of immunodeficiency including chronic steroid use, diabetes mellitus, protein losing enteropathy, renal or hepatic dysfunction, history of cancer or irradiation or history of HIV, hepatitis B or C.  Diagnostics: Skin Testing: Deferred due to recent antihistamines use.  Past Medical History: Patient Active Problem List   Diagnosis Date Noted   History of frequent upper respiratory infection 01/02/2019   Pollen-food allergy 01/02/2019   Not well controlled moderate persistent asthma 01/02/2019   Seasonal and perennial allergic rhinoconjunctivitis 01/02/2019   Penicillin allergy 01/02/2019   Past Medical History:  Diagnosis Date   Allergy    Anemia  Arthritis    neck   Asthma    GERD (gastroesophageal reflux disease)    Past Surgical History: Past Surgical History:  Procedure Laterality Date   COLONOSCOPY  2015   Dr.Pyrtle   CYST REMOVAL LEG Right 1979   foot   LIPOMA EXCISION Left 2011   hip   POLYPECTOMY     [place]   Medication List:  Current  Outpatient Medications  Medication Sig Dispense Refill   albuterol (PROVENTIL HFA;VENTOLIN HFA) 108 (90 Base) MCG/ACT inhaler Inhale 2 puffs into the lungs every 6 (six) hours as needed for wheezing or shortness of breath. 18 g 2   budesonide-formoterol (SYMBICORT) 160-4.5 MCG/ACT inhaler Inhale 2 puffs into the lungs 2 (two) times daily. 1 Inhaler 2   cetirizine (ZYRTEC) 10 MG tablet Take 10 mg by mouth daily.     diphenhydrAMINE (BENADRYL) 25 MG tablet Take 25 mg by mouth every 6 (six) hours as needed.     doxycycline (VIBRA-TABS) 100 MG tablet Take 1 tablet (100 mg total) by mouth 2 (two) times daily. 20 tablet 0   fluticasone (FLONASE) 50 MCG/ACT nasal spray Place 2 sprays into both nostrils daily.     ibuprofen (ADVIL,MOTRIN) 600 MG tablet Take 1 tablet (600 mg total) by mouth 3 (three) times daily. 15 tablet 0   UNABLE TO FIND Med Name: CBD oil po daily     Tiotropium Bromide Monohydrate (SPIRIVA RESPIMAT) 1.25 MCG/ACT AERS Inhale 2 puffs into the lungs daily. 1 Inhaler 3   No current facility-administered medications for this visit.    Allergies: Allergies  Allergen Reactions   Penicillins Anaphylaxis   Singulair [Montelukast Sodium] Anxiety   Social History: Social History   Socioeconomic History   Marital status: Married    Spouse name: Charles   Number of children: 2   Years of education: Not on file   Highest education level: Not on file  Occupational History   Not on file  Social Needs   Financial resource strain: Not on file   Food insecurity:    Worry: Not on file    Inability: Not on file   Transportation needs:    Medical: Not on file    Non-medical: Not on file  Tobacco Use   Smoking status: Former Smoker    Types: Cigarettes    Last attempt to quit: 06/05/2008    Years since quitting: 10.5   Smokeless tobacco: Never Used  Substance and Sexual Activity   Alcohol use: Yes    Alcohol/week: 3.0 standard drinks    Types: 3 Glasses of  wine per week   Drug use: No   Sexual activity: Yes    Birth control/protection: None  Lifestyle   Physical activity:    Days per week: Not on file    Minutes per session: Not on file   Stress: Not on file  Relationships   Social connections:    Talks on phone: Not on file    Gets together: Not on file    Attends religious service: Not on file    Active member of club or organization: Not on file    Attends meetings of clubs or organizations: Not on file    Relationship status: Not on file  Other Topics Concern   Not on file  Social History Narrative   Not on file   Lives in a 72+ year old house. Smoking: quit in 2012 Occupation: Camera operator  History: Water Damage/mildew in the house: no Carpet in the family room: no Carpet in the bedroom: no Heating: electric Cooling: central Pet: yes 2 cats x 6 yrs  Family History: Family History  Problem Relation Age of Onset   Alzheimer's disease Mother    Colon polyps Mother 81   Heart disease Father    Heart disease Maternal Grandmother    Heart disease Maternal Grandfather    Alzheimer's disease Paternal Grandmother    Heart disease Paternal Grandfather    Colon cancer Neg Hx    Esophageal cancer Neg Hx    Rectal cancer Neg Hx    Stomach cancer Neg Hx    Problem                               Relation Asthma                                   No  Eczema                                Daughter   Food allergy                          No  Allergic rhino conjunctivitis     Father   Review of Systems  Constitutional: Negative for appetite change, chills, fever and unexpected weight change.  HENT: Positive for congestion, postnasal drip and sneezing.   Eyes: Positive for itching.  Respiratory: Positive for chest tightness and shortness of breath. Negative for cough and wheezing.   Cardiovascular: Negative for chest pain.  Gastrointestinal: Negative for abdominal pain.    Genitourinary: Negative for difficulty urinating.  Skin: Negative for rash.  Allergic/Immunologic: Positive for environmental allergies and food allergies.  Neurological: Positive for headaches.   Objective: BP (!) 144/102 (BP Location: Left Arm, Patient Position: Sitting, Cuff Size: Normal)    Pulse 81    Temp 97.7 F (36.5 C) (Oral)    Resp 16    Ht 5\' 6"  (1.676 m)    Wt 225 lb 9.6 oz (102.3 kg)    LMP 08/07/2013    SpO2 96%    BMI 36.41 kg/m  Body mass index is 36.41 kg/m. Physical Exam  Constitutional: She is oriented to person, place, and time. She appears well-developed and well-nourished.  HENT:  Head: Normocephalic and atraumatic.  Right Ear: External ear normal.  Left Ear: External ear normal.  Nose: Nose normal.  Mouth/Throat: Oropharynx is clear and moist.  Eyes: Conjunctivae and EOM are normal.  Neck: Neck supple.  Cardiovascular: Normal rate, regular rhythm and normal heart sounds. Exam reveals no gallop and no friction rub.  No murmur heard. Pulmonary/Chest: Effort normal and breath sounds normal. She has no wheezes. She has no rales.  Abdominal: Soft.  Neurological: She is alert and oriented to person, place, and time.  Skin: Skin is warm. No rash noted.  Psychiatric: She has a normal mood and affect. Her behavior is normal.  Nursing note and vitals reviewed.  The plan was reviewed with the patient/family, and all questions/concerned were addressed.  It was my pleasure to see Palmer today and participate in her care. Please feel free to contact me with any questions or concerns.  Sincerely,  Rexene Alberts,  DO Allergy & Immunology  [facility] office: [phone] [facility] office: [phone]

## 2019-01-02 NOTE — Assessment & Plan Note (Signed)
Perioral pruritus with melons and certain seasons cause similar symptoms with fresh apples as well.  Discussed that her food triggered oral and throat symptoms are likely caused by oral food allergy syndrome (OFAS). This is caused by cross reactivity of pollen with fresh fruits and vegetables, and nuts. Symptoms are usually localized in the form of itching and burning in mouth and throat. Very rarely it can progress to more severe symptoms. Eating foods in cooked or processed forms usually minimizes symptoms. I recommended avoidance of eating the problem foods, especially during the peak season(s). Sometimes, OFAS can induce severe throat swelling or even a systemic reaction; with such instance, I advised them to report to a local ER.

## 2019-01-02 NOTE — Assessment & Plan Note (Signed)
Frequent sinus infections and history of pneumonia in the past.  Patient had sinus surgery about 10 years ago.  No recent ENT evaluation.  Continue to keep track of infections.  Get blood work as below to look at basic immune system.  If still having persistent sinus infections I recommend ENT referral as well.

## 2019-01-02 NOTE — Assessment & Plan Note (Addendum)
Diagnosed with asthma as a teen. Currently on Symbicort 160 2 puffs BID and lately using albuterol on a daily basis. Due to current COVID-19 pandemic we have been limiting spirometries in the office and will obtain at the next visit. However due to current clinical symptoms her asthma is not well controlled. . Daily controller medication(s): continue Symbicort 160 2 puffs twice a day with spacer and demonstrated proper use.  Spacer was given in the office. o START Spiriva 2 puffs daily. Demonstrated proper use and sample given. . Prior to physical activity: May use albuterol rescue inhaler 2 puffs 5 to 15 minutes prior to strenuous physical activities. Marland Kitchen Rescue medications: May use albuterol rescue inhaler 2 puffs or nebulizer every 4 to 6 hours as needed for shortness of breath, chest tightness, coughing, and wheezing. Monitor frequency of use.  . Get spirometry at next visit.

## 2019-01-02 NOTE — Assessment & Plan Note (Signed)
History of facial swelling after penicillin ingestion.  Continue avoidance for now and consider skin testing in future.

## 2019-01-02 NOTE — Assessment & Plan Note (Addendum)
Perennial rhinoconjunctivitis symptoms for the past 50+ years with worsening the spring. 2014 skin testing was positive to grass, weed, ragweed, tree, mold, dust mites, cat, dog and horse.  Singulair caused anxiety in the past.  Unable to skin test today due to recent antihistamine intake.  Get blood work as below and will make additional recommendations based on results.  Continue environmental control measures.  May use over the counter antihistamines such as Zyrtec (cetirizine), Claritin (loratadine), Allegra (fexofenadine), or Xyzal (levocetirizine) daily as needed.  May take it twice a day if needed.   Continue Flonase 2 sprays daily.  Nasal saline spray (i.e., Simply Saline) or nasal saline lavage (i.e., NeilMed) is recommended as needed and prior to medicated nasal sprays.  If above regimen does not control symptoms then will start on allergy immunotherapy.

## 2019-01-02 NOTE — Patient Instructions (Addendum)
Get bloodwork - IgE with zone 2, immune work up  ASTHMA: . Daily controller medication(s): continue Symbicort 160 2 puffs twice a day o START spiriva 2 puffs daily  . Prior to physical activity: May use albuterol rescue inhaler 2 puffs 5 to 15 minutes prior to strenuous physical activities. Marland Kitchen Rescue medications: May use albuterol rescue inhaler 2 puffs or nebulizer every 4 to 6 hours as needed for shortness of breath, chest tightness, coughing, and wheezing. Monitor frequency of use.  . Asthma control goals:  o Full participation in all desired activities (may need albuterol before activity) o Albuterol use two times or less a week on average (not counting use with activity) o Cough interfering with sleep two times or less a month o Oral steroids no more than once a year o No hospitalizations  ALLERGIES:  May use over the counter antihistamines such as Zyrtec (cetirizine), Claritin (loratadine), Allegra (fexofenadine), or Xyzal (levocetirizine) daily as needed.  May take it twice a day if needed.   Continue Flonase 2 sprays daily.  Nasal saline spray (i.e., Simply Saline) or nasal saline lavage (i.e., NeilMed) is recommended as needed and prior to medicated nasal sprays.  Let us know if you want to start allergy injections.  Keep track of infections.  Follow up in 2 months  Reducing Pollen Exposure . Pollen seasons: trees (spring), grass (summer) and ragweed/weeds (fall). Marland Kitchen Keep windows closed in your home and car to lower pollen exposure.  Susa Simmonds air conditioning in the bedroom and throughout the house if possible.  . Avoid going out in dry windy days - especially early morning. . Pollen counts are highest between 5 - 10 AM and on dry, hot and windy days.  . Save outside activities for late afternoon or after a heavy rain, when pollen levels are lower.  . Avoid mowing of grass if you have grass pollen allergy. Marland Kitchen Be aware that pollen can also be transported indoors on people  and pets.  . Dry your clothes in an automatic dryer rather than hanging them outside where they might collect pollen.  . Rinse hair and eyes before bedtime. Pet Allergen Avoidance: . Contrary to popular opinion, there are no "hypoallergenic" breeds of dogs or cats. That is because people are not allergic to an animal's hair, but to an allergen found in the animal's saliva, dander (dead skin flakes) or urine. Pet allergy symptoms typically occur within minutes. For some people, symptoms can build up and become most severe 8 to 12 hours after contact with the animal. People with severe allergies can experience reactions in public places if dander has been transported on the pet owners' clothing. Marland Kitchen Keeping an animal outdoors is only a partial solution, since homes with pets in the yard still have higher concentrations of animal allergens. . Before getting a pet, ask your allergist to determine if you are allergic to animals. If your pet is already considered part of your family, try to minimize contact and keep the pet out of the bedroom and other rooms where you spend a great deal of time. . As with dust mites, vacuum carpets often or replace carpet with a hardwood floor, tile or linoleum. . High-efficiency particulate air (HEPA) cleaners can reduce allergen levels over time. . While dander and saliva are the source of cat and dog allergens, urine is the source of allergens from rabbits, hamsters, mice and [place] pigs; so ask a non-allergic family member to clean the animal's cage. . If  you have a pet allergy, talk to your allergist about the potential for allergy immunotherapy (allergy shots). This strategy can often provide long-term relief.

## 2019-01-06 LAB — CBC WITH DIFFERENTIAL
Basophils Absolute: 0 10*3/uL (ref 0.0–0.2)
Basos: 1 %
EOS (ABSOLUTE): 0.3 10*3/uL (ref 0.0–0.4)
Eos: 5 %
Hematocrit: 41.1 % (ref 34.0–46.6)
Hemoglobin: 14 g/dL (ref 11.1–15.9)
IMMATURE GRANS (ABS): 0 10*3/uL (ref 0.0–0.1)
Immature Granulocytes: 0 %
LYMPHS: 30 %
Lymphocytes Absolute: 1.7 10*3/uL (ref 0.7–3.1)
MCH: 31.1 pg (ref 26.6–33.0)
MCHC: 34.1 g/dL (ref 31.5–35.7)
MCV: 91 fL (ref 79–97)
Monocytes Absolute: 0.4 10*3/uL (ref 0.1–0.9)
Monocytes: 6 %
Neutrophils Absolute: 3.4 10*3/uL (ref 1.4–7.0)
Neutrophils: 58 %
RBC: 4.5 x10E6/uL (ref 3.77–5.28)
RDW: 12 % (ref 11.7–15.4)
WBC: 5.8 10*3/uL (ref 3.4–10.8)

## 2019-01-06 LAB — STREP PNEUMONIAE 23 SEROTYPES IGG
PNEUMO AB TYPE 23 (23F): 0.4 ug/mL — AB (ref >1.3)
Pneumo Ab Type 1*: 1.6 ug/mL (ref >1.3)
Pneumo Ab Type 12 (12F)*: 1.2 ug/mL — ABNORMAL LOW (ref >1.3)
Pneumo Ab Type 14*: 0.9 ug/mL — ABNORMAL LOW (ref >1.3)
Pneumo Ab Type 17 (17F)*: 3.6 ug/mL (ref >1.3)
Pneumo Ab Type 19 (19F)*: 7.6 ug/mL (ref >1.3)
Pneumo Ab Type 2*: 4 ug/mL (ref >1.3)
Pneumo Ab Type 20*: 45.4 ug/mL (ref >1.3)
Pneumo Ab Type 22 (22F)*: 1.3 ug/mL — ABNORMAL LOW (ref >1.3)
Pneumo Ab Type 26 (6B)*: 1 ug/mL — ABNORMAL LOW (ref >1.3)
Pneumo Ab Type 3*: 16.9 ug/mL (ref >1.3)
Pneumo Ab Type 34 (10A)*: 5.5 ug/mL (ref >1.3)
Pneumo Ab Type 4*: 1.1 ug/mL — ABNORMAL LOW (ref >1.3)
Pneumo Ab Type 43 (11A)*: 2 ug/mL (ref >1.3)
Pneumo Ab Type 5*: 5.4 ug/mL (ref >1.3)
Pneumo Ab Type 51 (7F)*: 0.5 ug/mL — ABNORMAL LOW (ref >1.3)
Pneumo Ab Type 54 (15B)*: 1.8 ug/mL (ref >1.3)
Pneumo Ab Type 56 (18C)*: 4.4 ug/mL (ref >1.3)
Pneumo Ab Type 57 (19A)*: 24.8 ug/mL (ref >1.3)
Pneumo Ab Type 68 (9V)*: 3.5 ug/mL (ref >1.3)
Pneumo Ab Type 70 (33F)*: 1.1 ug/mL — ABNORMAL LOW (ref >1.3)
Pneumo Ab Type 8*: 9.4 ug/mL (ref >1.3)
Pneumo Ab Type 9 (9N)*: 4 ug/mL (ref >1.3)

## 2019-01-06 LAB — ALLERGENS W/TOTAL IGE AREA 2
Alternaria Alternata IgE: 0.57 kU/L — AB
Aspergillus Fumigatus IgE: <0.1 kU/L
Bermuda Grass IgE: 0.3 kU/L — AB
Cat Dander IgE: 17.1 kU/L — AB
Cedar, Mountain IgE: 0.16 kU/L — AB
Cladosporium Herbarum IgE: <0.1 kU/L
Cockroach, German IgE: <0.1 kU/L
Common Silver Birch IgE: 2.16 kU/L — AB
Cottonwood IgE: 0.29 kU/L — AB
D Pteronyssinus IgE: 0.4 kU/L — AB
D002-IGE D FARINAE: 0.49 kU/L — AB
Dog Dander IgE: 6.44 kU/L — AB
Elm, American IgE: 0.23 kU/L — AB
IGE (IMMUNOGLOBULIN E), SERUM: 211 [IU]/mL (ref 6–495)
Johnson Grass IgE: 0.37 kU/L — AB
Mouse Urine IgE: <0.1 kU/L
Oak, White IgE: 1.69 kU/L — AB
Pecan, Hickory IgE: 1.04 kU/L — AB
Pigweed, Rough IgE: 0.29 kU/L — AB
Ragweed, Short IgE: 3.37 kU/L — AB
Sheep Sorrel IgE Qn: 0.23 kU/L — AB
T001-IGE MAPLE/BOX ELDER: 1.61 kU/L — AB
TIMOTHY IGE: 1.3 kU/L — AB
White Mulberry IgE: <0.1 kU/L

## 2019-01-06 LAB — DIPHTHERIA / TETANUS ANTIBODY PANEL
Diphtheria Ab: 1.1 IU/mL (ref <0.10)
Tetanus Ab, IgG: 2.92 IU/mL (ref <0.10)

## 2019-01-06 LAB — IGG, IGA, IGM
IgA/Immunoglobulin A, Serum: 63 mg/dL — ABNORMAL LOW (ref 87–352)
IgG (Immunoglobin G), Serum: 788 mg/dL (ref 586–1602)
IgM (Immunoglobulin M), Srm: 83 mg/dL (ref 26–217)

## 2019-01-10 ENCOUNTER — Encounter: Payer: Self-pay | Admitting: Allergy

## 2019-03-08 ENCOUNTER — Ambulatory Visit: Payer: BLUE CROSS/BLUE SHIELD | Admitting: Allergy

## 2019-04-12 ENCOUNTER — Ambulatory Visit: Payer: BLUE CROSS/BLUE SHIELD | Admitting: Allergy

## 2019-05-19 ENCOUNTER — Other Ambulatory Visit: Payer: Self-pay | Admitting: Family Medicine

## 2019-05-19 NOTE — Telephone Encounter (Signed)
Pt. Is currently living in [place] and will be seeing an allergist there next week. Did a courtesy refill x 1.

## 2020-02-15 IMAGING — MG DIGITAL SCREENING BILATERAL MAMMOGRAM WITH CAD
4 series · 4 of 4 positions shown · non-contrast
Comparison: Previous exam(s).

CLINICAL DATA: Screening.

EXAM:
DIGITAL SCREENING BILATERAL MAMMOGRAM WITH CAD

[R CC]
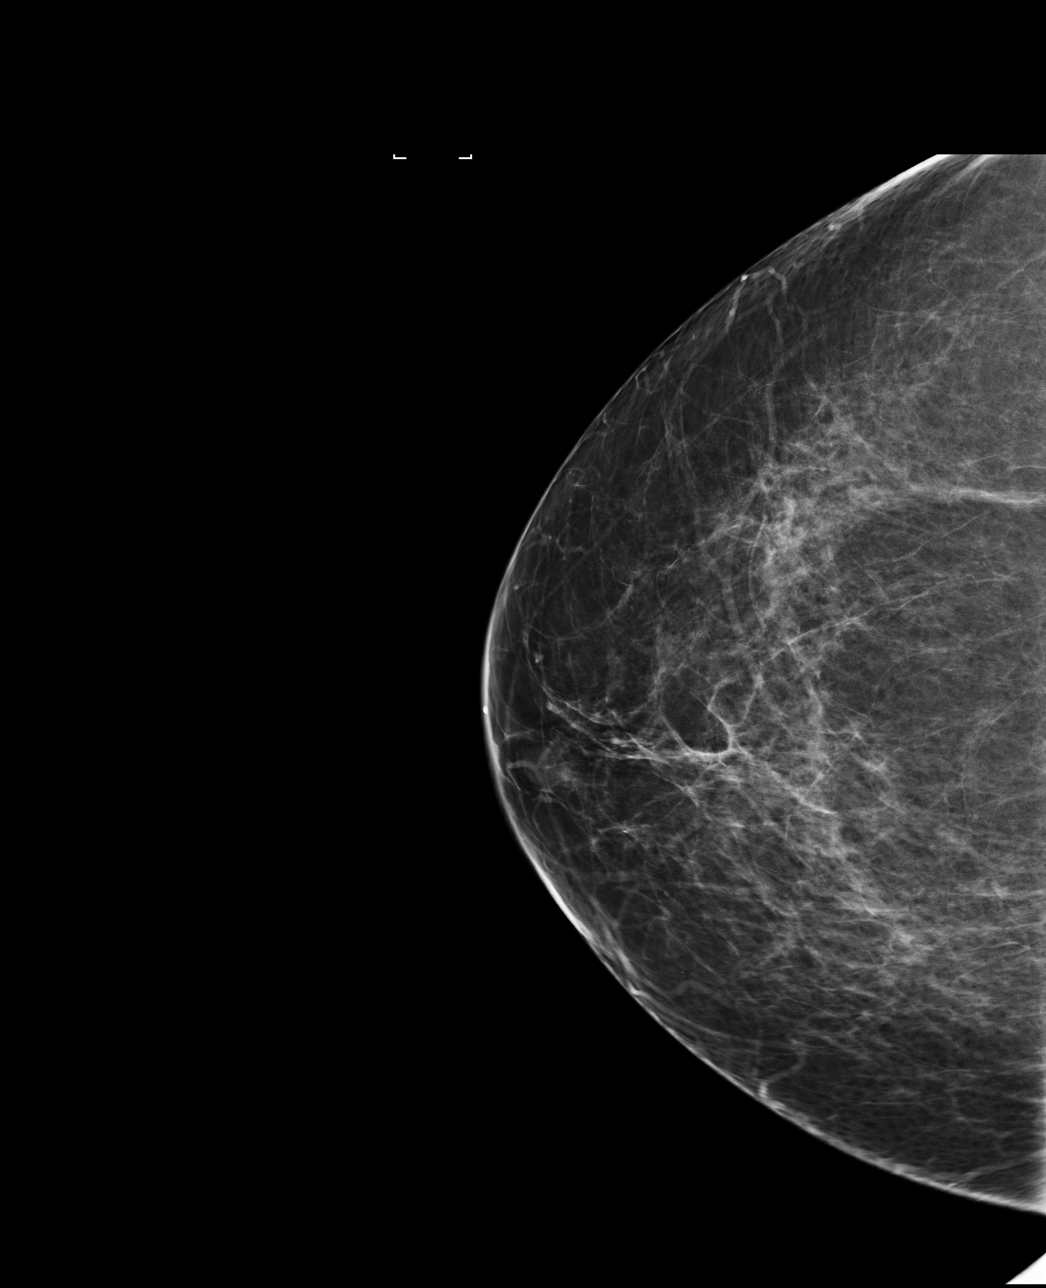

[L MLO]
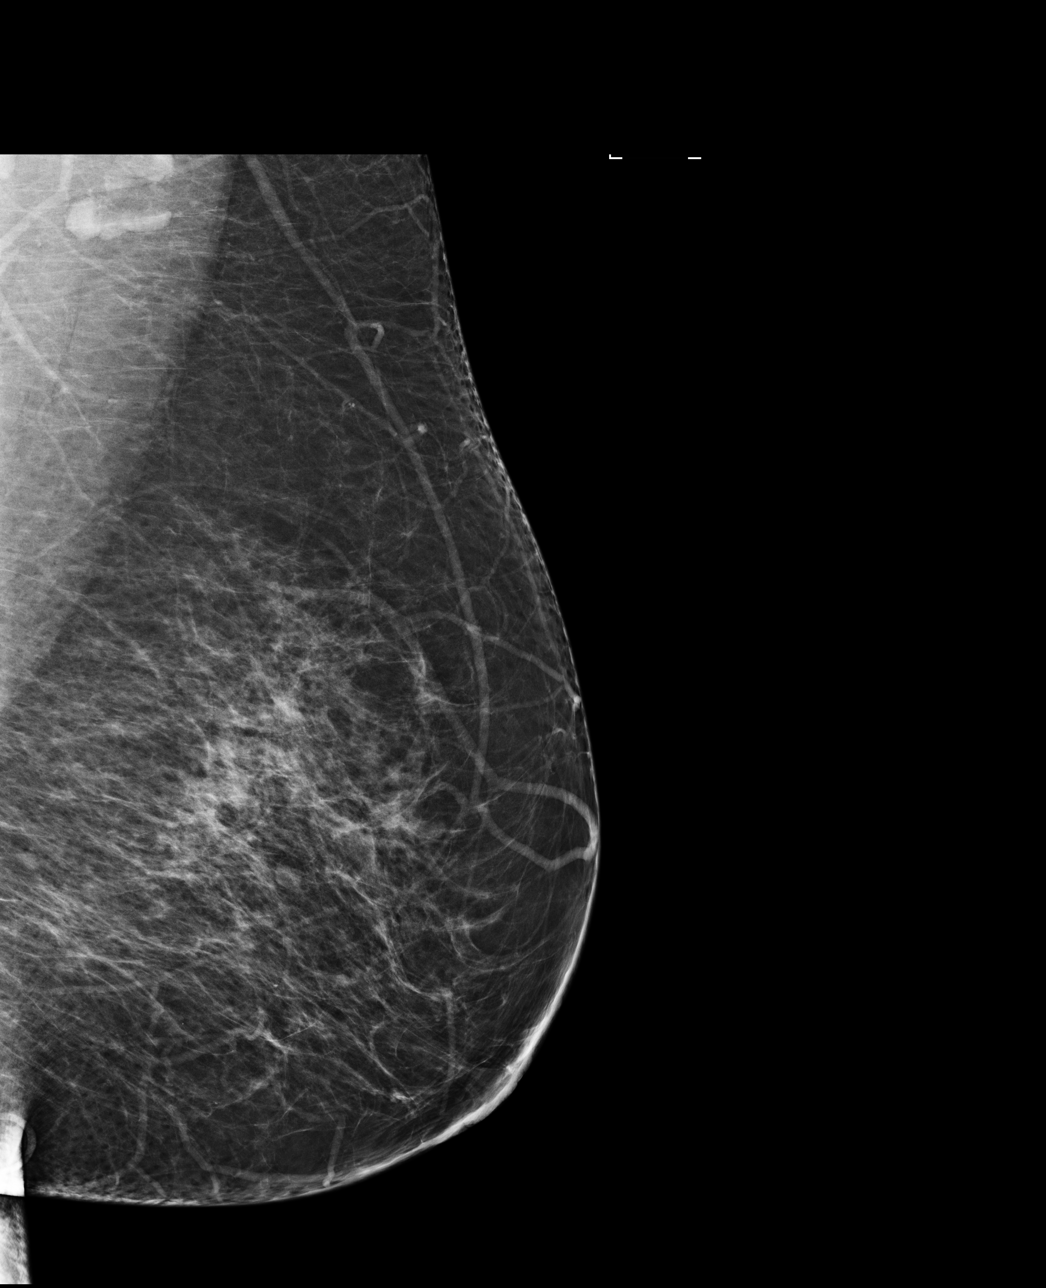

[R MLO]
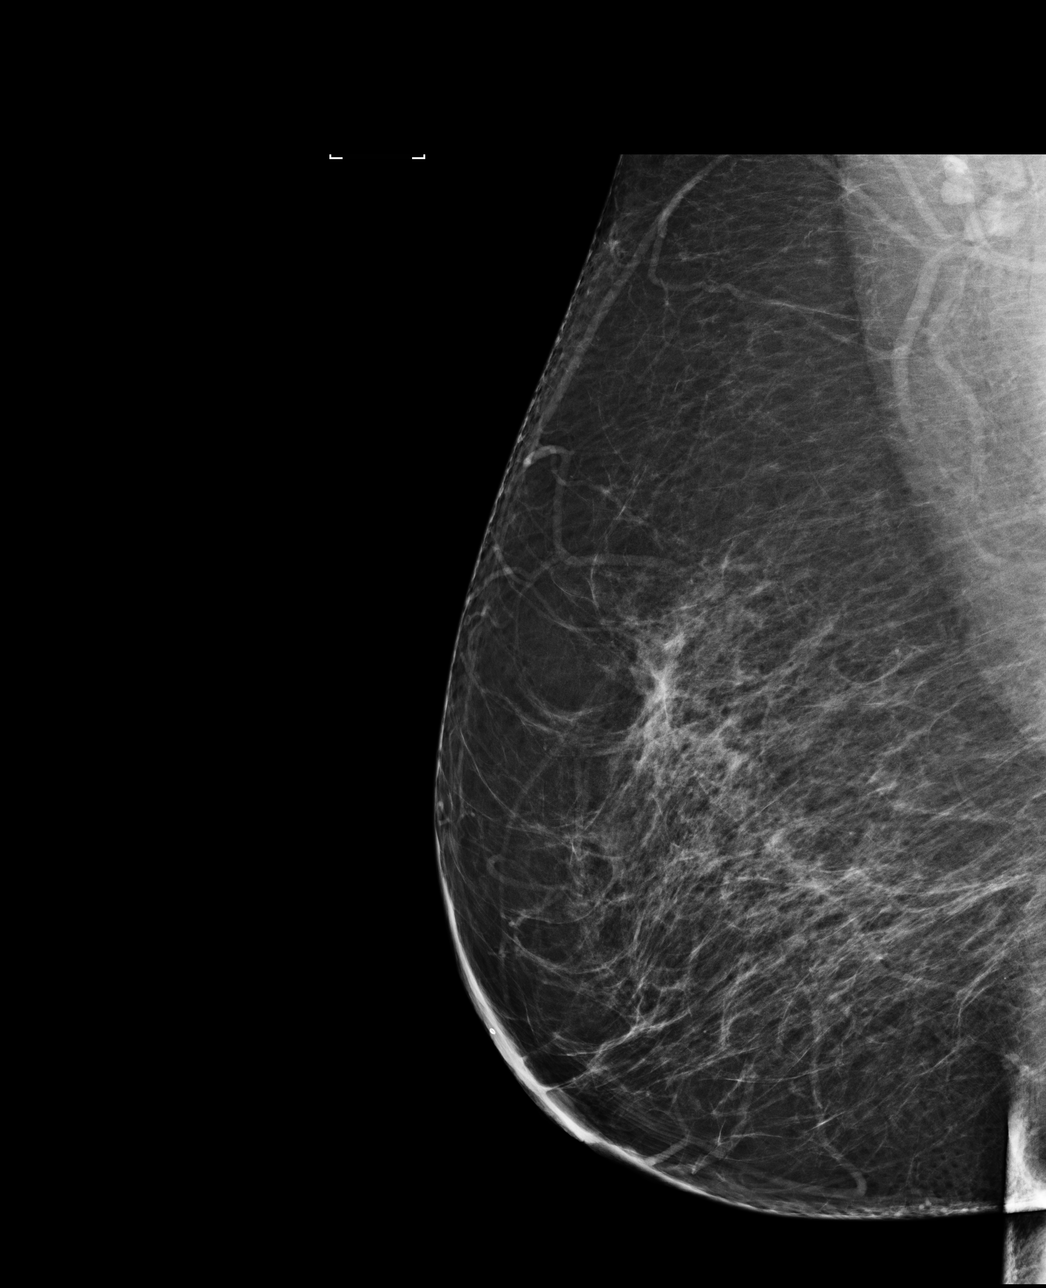

[L CC]
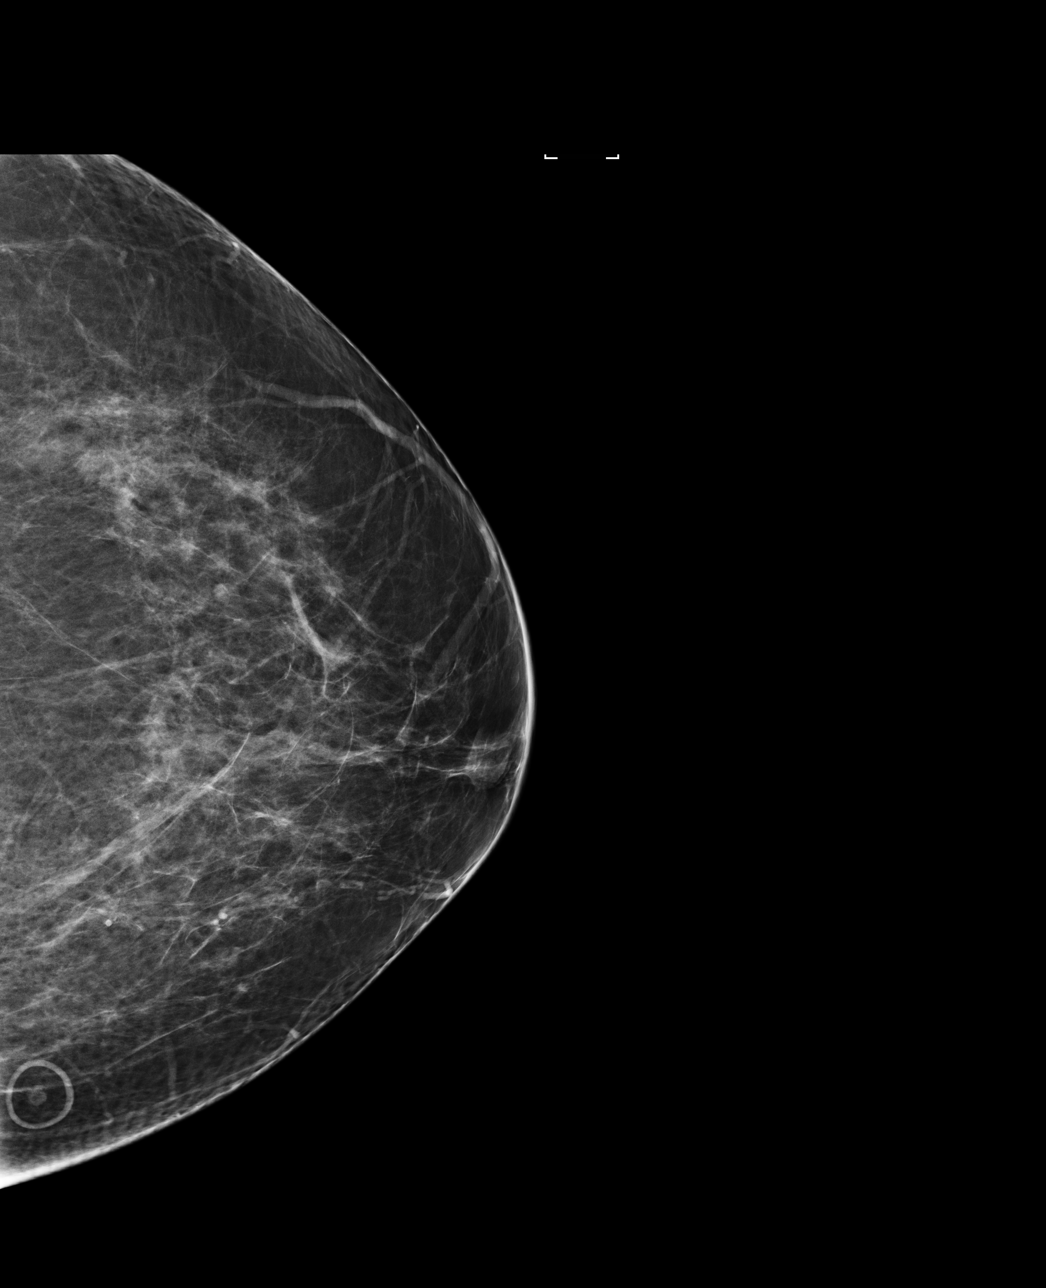

[4 of 4 positions shown; findings below may reference images not displayed]

ACR Breast Density Category b: There are scattered areas of
fibroglandular density.
FINDINGS: There are no findings suspicious for malignancy. Images were
processed with CAD.
IMPRESSION: No mammographic evidence of malignancy. A result letter of this
screening mammogram will be mailed directly to the patient.

RECOMMENDATION:
Screening mammogram in one year. (Code:AS-G-LCT)

BI-RADS CATEGORY  1: Negative.

## 2020-06-10 LAB — COVID-19 (LIAT): SARS-CoV-2: DETECTED — AB

## 2020-06-11 ENCOUNTER — Telehealth: Payer: Self-pay

## 2020-06-11 NOTE — Telephone Encounter (Signed)
Error Patient called the wrong office.

## 2020-08-21 LAB — POC COVID-19 & INFLUENZA COMBO (LIAT IN HOUSE)
INFLUENZA A: NOT DETECTED
INFLUENZA B: NOT DETECTED
SARS-CoV-2: NOT DETECTED

## 2020-10-18 LAB — POC COVID-19 (LIAT IN HOUSE): SARS-CoV-2: NOT DETECTED

## 2021-01-09 LAB — COMPREHENSIVE METABOLIC PANEL
ALT: 37 U/L — ABNORMAL HIGH (ref 0–35)
AST: 23 U/L (ref 0–35)
Albumin/Globulin Ratio: 2.3 mmol/L (ref 1.00–2.70)
Albumin: 4.6 g/dL (ref 3.5–5.2)
Alk Phosphatase: 64 U/L (ref 35–117)
Anion Gap: 14 mmol/L (ref 2–17)
BUN: 21 mg/dL — ABNORMAL HIGH (ref 6–20)
Bun/Cre Ratio: 30 — ABNORMAL HIGH (ref 6.0–17.0)
CO2: 23 mmol/L (ref 22–29)
Calcium: 9 mg/dL (ref 8.6–10.0)
Chloride: 102 mmol/L (ref 98–107)
Creatinine: 0.7 mg/dL (ref 0.5–1.0)
GFR African American: 110 mL/min/{1.73_m2} (ref >=90)
GFR Non-African American: 95 mL/min/{1.73_m2} (ref >=90)
Globulin: 2 g/dL (ref 1.9–4.4)
Glucose: 83 mg/dL (ref 70–99)
OSMOLALITY CALCULATED: 279 mOsm/kg (ref 270–287)
Potassium: 4.3 mmol/L (ref 3.5–5.3)
Sodium: 139 mmol/L (ref 135–145)
Total Bilirubin: 0.49 mg/dL (ref 0.00–1.20)
Total Protein: 6.6 g/dL (ref 6.4–8.3)

## 2021-01-09 LAB — CBC WITH AUTO DIFFERENTIAL
Absolute Baso #: 0.1 10*3/uL (ref 0.0–0.2)
Absolute Eos #: 0.4 10*3/uL (ref 0.0–0.5)
Absolute Lymph #: 1.9 10*3/uL (ref 1.0–3.2)
Absolute Mono #: 0.4 10*3/uL (ref 0.3–1.0)
Basophils %: 0.8 % (ref 0.0–2.0)
Eosinophils %: 6.1 % (ref 0.0–7.0)
Hematocrit: 38.5 % (ref 34.0–47.0)
Hemoglobin: 12.9 g/dL (ref 11.5–15.7)
Immature Grans (Abs): 0.01 10*3/uL (ref 0.00–0.06)
Immature Granulocytes: 0.2 % (ref 0.0–0.6)
Lymphocytes: 32.3 % (ref 15.0–45.0)
MCH: 31.7 pg (ref 27.0–34.5)
MCHC: 33.5 g/dL (ref 32.0–36.0)
MCV: 94.6 fL (ref 81.0–99.0)
MPV: 10.7 fL (ref 7.2–13.2)
Monocytes: 7.1 % (ref 4.0–12.0)
NRBC Absolute: 0 10*3/uL (ref 0.000–0.012)
NRBC Automated: 0 % (ref 0.0–0.2)
Neutrophils %: 53.5 % (ref 42.0–74.0)
Neutrophils Absolute: 3.2 10*3/uL (ref 1.6–7.3)
Platelets: 308 10*3/uL (ref 140–440)
RBC: 4.07 x10e6/mcL (ref 3.60–5.20)
RDW: 12 % (ref 11.0–16.0)
WBC: 5.9 10*3/uL (ref 3.8–10.6)

## 2021-01-09 LAB — LIPID PANEL
Chol/HDL Ratio: 4.1 (ref 0.0–4.4)
Cholesterol: 212 mg/dL — ABNORMAL HIGH (ref 100–200)
HDL: 52 mg/dL (ref >=50)
LDL Cholesterol: 128.6 mg/dL — ABNORMAL HIGH (ref 0.0–100.0)
LDL/HDL Ratio: 2.5
Triglycerides: 157 mg/dL — ABNORMAL HIGH (ref 0–149)
VLDL: 31.4 mg/dL (ref 5.0–40.0)

## 2021-01-09 LAB — HEMOGLOBIN A1C
Est. Avg. Glucose, WB: 120
Est. Avg. Glucose-calculated: 129
Hemoglobin A1C: 5.8 % (ref 4.0–6.0)

## 2021-01-20 LAB — PAP IG, LIQUID-BASED RFX APTIMA HPV WHEN HPV ASCU 16/18,45 (199340): .: 0

## 2021-03-15 LAB — POC COVID-19 & INFLUENZA COMBO (LIAT IN HOUSE)
INFLUENZA A: NOT DETECTED
INFLUENZA B: NOT DETECTED
SARS-CoV-2: NOT DETECTED

## 2021-07-10 ENCOUNTER — Encounter: Attending: Family Medicine | Primary: Family Medicine

## 2021-07-14 ENCOUNTER — Ambulatory Visit: Admit: 2021-07-14 | Discharge: 2021-07-14 | Payer: BLUE CROSS/BLUE SHIELD | Attending: Family | Primary: Family Medicine

## 2021-07-14 DIAGNOSIS — J0100 Acute maxillary sinusitis, unspecified: Secondary | ICD-10-CM

## 2021-07-14 MED ORDER — CEFDINIR 300 MG PO CAPS
300 MG | ORAL_CAPSULE | Freq: Two times a day (BID) | ORAL | 0 refills | Status: AC
Start: 2021-07-14 — End: 2021-07-24

## 2021-07-14 NOTE — Progress Notes (Signed)
CHIEF COMPLAINT:  Chief Complaint   Patient presents with    Sinusitis             HISTORY OF PRESENT ILLNESS:    Having thick nasal drainage and nasal congestion for approximately week.  Gradually worsening with dark green nasal drainage.  Patient has been using nasal rinses and antihistamines.  Patient has a history of sinus infections and current symptoms feel like sinus flexion that have required antibiotics in the past.  No fevers chills or body aches.  No shortness of breath chest pain dizziness or syncope.  Patient had negative COVID test at home couple days after start of symptoms.      CURRENT MEDICATION LIST:    Current Outpatient Medications   Medication Sig Dispense Refill    cefdinir (OMNICEF) 300 MG capsule Take 1 capsule by mouth 2 times daily for 10 days 20 capsule 0    Cetirizine HCl (ZYRTEC ALLERGY) 10 MG CAPS 1 tablet Orally Once a day for 30 day(s)      diphenhydrAMINE HCl (BENADRYL ALLERGY PO) Benadryl Allergy      albuterol sulfate HFA (PROVENTIL;VENTOLIN;PROAIR) 108 (90 Base) MCG/ACT inhaler 1 puff as needed Inhalation every 4 hrs      budesonide-formoterol (SYMBICORT) 160-4.5 MCG/ACT AERO 2 puffs Inhalation Twice a day      ibuprofen (ADVIL;MOTRIN) 200 MG tablet 2-3 tablet with food or milk as needed Orally Three times a day      tiZANidine (ZANAFLEX) 4 MG tablet 1 tablet as needed Orally Three times a day (Patient not taking: Reported on 07/14/2021)       No current facility-administered medications for this visit.        ALLERGIES:    Allergies   Allergen Reactions    Diclofenac      Other reaction(s): diarrhea    Penicillins      Other reaction(s): RASH        HISTORY:  Past Medical History:   Diagnosis Date    Abdominal pain     Asthma     Atelectasis     Colitis     Congenital pectus excavatum     Depression     Fatty liver     High cholesterol     Osteoarthritis     Pelvic pain in female     Vaginal atrophy     Venous insufficiency       Past Surgical History:   Procedure Laterality  Date    TONSILLECTOMY  51      Social History     Socioeconomic History    Marital status: Married     Spouse name: Not on file    Number of children: Not on file    Years of education: Not on file    Highest education level: Not on file   Occupational History    Not on file   Tobacco Use    Smoking status: Former     Packs/day: 1.00     Years: 20.00     Pack years: 20.00     Types: Cigarettes    Smokeless tobacco: Never   Vaping Use    Vaping Use: Never used   Substance and Sexual Activity    Alcohol use: Never    Drug use: Never    Sexual activity: Not on file   Other Topics Concern    Not on file   Social History Narrative    Not on file  Social Determinants of Health     Financial Resource Strain: Not on file   Food Insecurity: Not on file   Transportation Needs: Not on file   Physical Activity: Not on file   Stress: Not on file   Social Connections: Not on file   Intimate Partner Violence: Not on file   Housing Stability: Not on file      Family History   Problem Relation Age of Onset    Dementia Father         REVIEW OF SYSTEMS:  Review of Systems   Constitutional:  Negative for chills and fever.   Respiratory:  Negative for shortness of breath.    Cardiovascular:  Negative for chest pain.   Neurological:  Negative for dizziness and syncope.      PHYSICAL EXAM:  Physical Exam  Constitutional:       Appearance: Normal appearance.   HENT:      Head: Normocephalic and atraumatic.      Comments: Maxillary sinus tenderness with palpation     Ears:      Comments: External ear canals normal.  Bilateral TMs clear/intact, with clear effusion.     Nose: Nose normal.      Mouth/Throat:      Comments: Posterior throat slightly inflamed  Eyes:      Conjunctiva/sclera: Conjunctivae normal.   Cardiovascular:      Rate and Rhythm: Normal rate and regular rhythm.   Pulmonary:      Effort: Pulmonary effort is normal.      Breath sounds: Normal breath sounds.   Skin:     General: Skin is warm and dry.   Neurological:       General: No focal deficit present.      Mental Status: She is alert and oriented to person, place, and time.   Psychiatric:         Mood and Affect: Mood normal.         Behavior: Behavior normal.         Judgment: Judgment normal.      Vital Signs -   Visit Vitals  BP 138/82 (Site: Left Upper Arm, Position: Sitting, Cuff Size: Large Adult)   Pulse 73   Temp 98.5 ??F (36.9 ??C) (Oral)   Resp 16   Ht 5\' 8"  (1.727 m)   Wt 244 lb (110.7 kg)   SpO2 98%   BMI 37.10 kg/m??            LABS  No results found for this visit on 07/14/21.    TREATMENT:    1. Acute non-recurrent maxillary sinusitis  -     cefdinir (OMNICEF) 300 MG capsule; Take 1 capsule by mouth 2 times daily for 10 days, Disp-20 capsule, R-0Normal   --- Follow-up if symptoms do not improve with treatment or anytime if symptoms worsen  EDUCATION:   Education Documentation  No documentation found.  Education Comments  No comments found.          Follow up and Dispositions:  Return as needed.       09/13/21, APRN - NP

## 2021-08-02 ENCOUNTER — Encounter

## 2021-10-07 ENCOUNTER — Ambulatory Visit: Admit: 2021-10-07 | Discharge: 2021-10-07 | Payer: BLUE CROSS/BLUE SHIELD | Attending: Family | Primary: Family Medicine

## 2021-10-07 DIAGNOSIS — R102 Pelvic and perineal pain: Secondary | ICD-10-CM

## 2021-10-07 LAB — AMB POC URINALYSIS DIP STICK AUTO W/O MICRO
Bilirubin, Urine, POC: NEGATIVE
Glucose, Urine, POC: NEGATIVE
Nitrite, Urine, POC: NEGATIVE
Specific Gravity, Urine, POC: 1.03 (ref 1.001–1.035)
Urobilinogen, POC: 0.2
pH, Urine, POC: 5.5 (ref 4.6–8.0)

## 2021-10-07 NOTE — Telephone Encounter (Signed)
Pt states that it seems like her colon is coming out of her vagina.  Not comfortable, does not hurt.  Not sure what it is, if she need to go to ER, seen provider?

## 2021-10-07 NOTE — Patient Instructions (Signed)
Pelvic pain information is available in the patient instruction section of the chart.

## 2021-10-07 NOTE — Progress Notes (Signed)
Betty Dudley is a 61 y.o. female who presents to the office today for the following:  Chief Complaint   Patient presents with    Other     Vaginal discomfort.  Symptoms started yesterday          HPI   Patient is a 61 year old female that presents today with vaginal pain.  Patient states that today she started having discomfort in the vaginal area when she thought something was hanging out.  She states she did try to push it back in.  She states it does not feel like it sticking out anymore but it is just uncomfortable down there.  Patient did try calling her OB/GYN and her primary care but they could not see her today and sent her here for treatment and evaluation.  She is between several months since she is had any vaginal intercourse with her husband. She denies any other urinary symptoms prior to today.    Allergies   Allergen Reactions    Diclofenac      Other reaction(s): diarrhea    Penicillins      Other reaction(s): RASH      Current Outpatient Medications   Medication Sig Dispense Refill    Cetirizine HCl (ZYRTEC ALLERGY) 10 MG CAPS 1 tablet Orally Once a day for 30 day(s)      diphenhydrAMINE HCl (BENADRYL ALLERGY PO) Benadryl Allergy      albuterol sulfate HFA (PROVENTIL;VENTOLIN;PROAIR) 108 (90 Base) MCG/ACT inhaler 1 puff as needed Inhalation every 4 hrs      budesonide-formoterol (SYMBICORT) 160-4.5 MCG/ACT AERO 2 puffs Inhalation Twice a day      ibuprofen (ADVIL;MOTRIN) 200 MG tablet 2-3 tablet with food or milk as needed Orally Three times a day       No current facility-administered medications for this visit.        Past Medical History:   Diagnosis Date    Abdominal pain     Asthma     Atelectasis     Colitis     Congenital pectus excavatum     Depression     Fatty liver     High cholesterol     Osteoarthritis     Pelvic pain in female     Vaginal atrophy     Venous insufficiency       OB History    No obstetric history on file.          BP 120/80    Pulse 84    Temp 98.8 ??F (37.1 ??C)    Resp  16    Wt 240 lb (108.9 kg)    LMP  (LMP Unknown)      Social History     Tobacco Use    Smoking status: Former     Packs/day: 1.00     Years: 20.00     Pack years: 20.00     Types: Cigarettes    Smokeless tobacco: Never   Vaping Use    Vaping Use: Never used   Substance Use Topics    Alcohol use: Never    Drug use: Never        Review of Systems       Physical Exam  Vitals and nursing note reviewed. Chaperone present: pt declined chaperone.   Constitutional:       Appearance: Normal appearance. She is normal weight.   Pulmonary:      Effort: Pulmonary effort is normal.   Genitourinary:  General: Normal vulva.      Exam position: Supine.      Pubic Area: No rash.       Labia:         Right: No rash, tenderness, lesion or injury.         Left: No rash, tenderness, lesion or injury.       Urethra: No prolapse, urethral pain, urethral swelling or urethral lesion.   Skin:     Capillary Refill: Capillary refill takes less than 2 seconds.   Neurological:      Mental Status: She is alert and oriented to person, place, and time.   Psychiatric:         Mood and Affect: Mood normal.         Behavior: Behavior normal.         Thought Content: Thought content normal.         Judgment: Judgment normal.      BP 120/80    Pulse 84    Temp 98.8 ??F (37.1 ??C)    Resp 16    Wt 240 lb (108.9 kg)    LMP  (LMP Unknown)    SpO2 96%    BMI 36.49 kg/m??      Past Surgical History:   Procedure Laterality Date    TONSILLECTOMY  1980        Orders Placed This Encounter   Procedures    Culture, Urine     Order Specific Question:   Specify (ex-cath, midstream, cysto, etc)?     Answer:   midstream    POC Urinalysis, Auto W/O Scope (84536)          Results for orders placed or performed in visit on 10/07/21   POC Urinalysis, Auto W/O Scope (81003)   Result Value Ref Range    Color (UA POC) Yellow     Clarity (UA POC) Cloudy     Glucose, Urine, POC Negative Negative    Bilirubin, Urine, POC Negative Negative    Ketones, Urine, POC Trace Negative     Specific Gravity, Urine, POC 1.030 1.001 - 1.035    Blood (UA POC) 2+ Negative    pH, Urine, POC 5.5 4.6 - 8.0    Protein, Urine, POC 1+ Negative    Urobilinogen, POC 0.2 mg/dL     Nitrite, Urine, POC Negative Negative    Leukocyte Esterase, Urine, POC 1+ Negative      We will call you Back when the results of the urine culture come in.  In the meantime try to increase pelvic floor muscles.  You can look on the Internet to find some exercises that might be helpful.Follow up with obgyn or your primary care. Stay hydrated.     1. Vaginal pain  -     POC Urinalysis, Auto W/O Scope (81003)  2. Leukocytes in urine  -     Culture, Urine   Patient Instructions   Pelvic pain information is available in the patient instruction section of the chart.      Return if symptoms worsen or fail to improve, for PRN, See treatment note.       Darryl Lent, APRN - NP

## 2021-10-07 NOTE — Telephone Encounter (Signed)
Patient c/o pressure since yesterday, in vagina/ rectal area, uncomfortable, itchiness. Advised patient Dr Rivka Safer is OOO this week and asked if she can try and see her PCP this week. Patient states she will try to get in with her PCP and if unable will go to Urgent care for evaluation.

## 2021-10-07 NOTE — Progress Notes (Signed)
Urine culture sent to lab on 10/07/2020 - by TMB at 1743

## 2021-10-08 LAB — CULTURE, URINE: FINAL REPORT: >100000

## 2021-10-08 NOTE — Telephone Encounter (Signed)
Spoke with patient and patient said she was unable to get in with her PCP and they advised her to go to urgent care. Patient said she went but was still advised to follow up with our office. Scheduled for 01/09 at 10:00 AM with Dr. Rivka Safer.

## 2021-10-13 ENCOUNTER — Ambulatory Visit
Admit: 2021-10-13 | Discharge: 2021-10-13 | Payer: BLUE CROSS/BLUE SHIELD | Attending: Obstetrics & Gynecology | Primary: Family Medicine

## 2021-10-13 DIAGNOSIS — N952 Postmenopausal atrophic vaginitis: Secondary | ICD-10-CM

## 2021-10-13 MED ORDER — ESTRADIOL 0.1 MG/GM VA CREA
0.1 MG/GM | Freq: Every day | VAGINAL | 3 refills | Status: AC
Start: 2021-10-13 — End: 2022-01-11

## 2021-10-13 NOTE — Telephone Encounter (Signed)
Betty Dudley from [facility] stated they needed to know how the pt is supposed to take the Estradiol cream. It has two directions.

## 2021-10-13 NOTE — Progress Notes (Signed)
CC: Patient presents today for Follow-up (Pt c/o prolapse//rm)    HPI: see assessment and plan    OB History    No obstetric history on file.         GYN History     Past Medical History:  Past Medical History:   Diagnosis Date    Abdominal pain     Asthma     Atelectasis     Colitis     Congenital pectus excavatum     Depression     Fatty liver     High cholesterol     Osteoarthritis     Pelvic pain in female     Vaginal atrophy     Venous insufficiency        Past Surgical History:  Past Surgical History:   Procedure Laterality Date    TONSILLECTOMY  1980       Allergies:   Allergies   Allergen Reactions    Diclofenac      Other reaction(s): diarrhea    Penicillins      Other reaction(s): RASH       Medication History:  Current Outpatient Medications   Medication Sig Dispense Refill    Cetirizine HCl (ZYRTEC ALLERGY) 10 MG CAPS 1 tablet Orally Once a day for 30 day(s)      diphenhydrAMINE HCl (BENADRYL ALLERGY PO) Benadryl Allergy      albuterol sulfate HFA (PROVENTIL;VENTOLIN;PROAIR) 108 (90 Base) MCG/ACT inhaler 1 puff as needed Inhalation every 4 hrs      budesonide-formoterol (SYMBICORT) 160-4.5 MCG/ACT AERO 2 puffs Inhalation Twice a day      ibuprofen (ADVIL;MOTRIN) 200 MG tablet 2-3 tablet with food or milk as needed Orally Three times a day       No current facility-administered medications for this visit.       Social History:  Social History     Socioeconomic History    Marital status: Married     Spouse name: Not on file    Number of children: Not on file    Years of education: Not on file    Highest education level: Not on file   Occupational History    Not on file   Tobacco Use    Smoking status: Former     Packs/day: 1.00     Years: 20.00     Pack years: 20.00     Types: Cigarettes    Smokeless tobacco: Never   Vaping Use    Vaping Use: Never used   Substance and Sexual Activity    Alcohol use: Never    Drug use: Never    Sexual activity: Not on file   Other Topics Concern    Not on file   Social  History Narrative    Not on file     Social Determinants of Health     Financial Resource Strain: Not on file   Food Insecurity: Not on file   Transportation Needs: Not on file   Physical Activity: Not on file   Stress: Not on file   Social Connections: Not on file   Intimate Partner Violence: Not on file   Housing Stability: Not on file       Family History:  Family History   Problem Relation Age of Onset    Dementia Father        Objective:   BP 134/80    Wt 244 lb (110.7 kg)    LMP  (LMP Unknown)  BMI 37.10 kg/m??   Urine:@LASTPROCAMB (POC81001;POC81003;poc81002;poc81000;POC81001F;POC81003E)@  Hemoglobin: No components found for: [facility]   General Examination:  GENERAL APPEARANCE: alert, well hydrated, in no distress .   HEAD: normocephalic, atraumatic.   EYES: no lid lag, stare or proptosis.   SKIN: normal.   NEUROLOGIC: alert and oriented, gait normal.   PSYCH: alert, oriented, cognitive function intact, cooperative with exam, good eye contact, mood/affect appropriate      BREAST EXAM: not examined    PELVIC EXAM:   Examination:   Gynecological:  EXTERNAL GENITALIA: normal, introitus normal.   URETHRA: meatus normal.   BLADDER: normal.   VAGINA: pale  mucosa without any lesions. Gr2 recotcele gr2 ut prol  CERVIX: no cervical movement tenderness, no lesions.   UTERUS: normal mobility, nontender.   ADNEXA: no mass, nontender.   ANUS/PERINEUM: normal.                  Assessment/Plan:     10/13/2021 problem visit, Betty Dudley is a 61 year old menopausal patient, of note I do not see a gravity status on her, she went to the urgent care about a week ago with a vaginal bulge that she noted when she is on the toilet.  It quickly went away and she has no significant pain but is worried about what could be.  Her bowel movements are daily and not hard but she does require some mild splinting.  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  I will initiate Estrace cream since she also has significant atrophy  and complains of dryness and bring her back in 3 months to further assess.  I recommend in the meantime some healthy weight loss.  She had a normal colonoscopy last 3 years.  Not sexually active with her husband but does masturbate.  I will see need right now for any surgical intervention and we will plan to expectantly manage unless the symptoms are to significantly bother her denies bleeding.        01/14/21 new patient annual, this is a 61 year old menopausal patient  without complaints and here for preventative visit. She denies any  bleeding. She is rarely sexually active with her husband about twice a  year. She has some moderate atrophy noted on exam and I  recommend over-the-counter Vagisil as needed for any irritation.  She's up with her mammogram. Up-to-date with colon screening. SBE  recommended monthly and Pap smear performed.

## 2022-01-11 ENCOUNTER — Emergency Department: Admit: 2022-01-11 | Payer: BLUE CROSS/BLUE SHIELD | Primary: Family Medicine

## 2022-01-11 ENCOUNTER — Inpatient Hospital Stay
Admit: 2022-01-11 | Discharge: 2022-01-11 | Disposition: A | Discharge status: Home / Self Care | Payer: BLUE CROSS/BLUE SHIELD | Attending: Emergency Medicine

## 2022-01-11 DIAGNOSIS — M79605 Pain in left leg: Secondary | ICD-10-CM

## 2022-01-11 MED ORDER — KETOROLAC TROMETHAMINE 15 MG/ML IJ SOLN
15 MG/ML | Freq: Once | INTRAMUSCULAR | Status: AC
Start: 2022-01-11 — End: 2022-01-11
  Administered 2022-01-11: 22:00:00 15 mg via INTRAMUSCULAR

## 2022-01-11 MED ORDER — ENOXAPARIN SODIUM 100 MG/ML IJ SOSY
100 MG/ML | Freq: Once | INTRAMUSCULAR | Status: AC
Start: 2022-01-11 — End: 2022-01-11
  Administered 2022-01-11: 22:00:00 100 mg via SUBCUTANEOUS

## 2022-01-11 MED FILL — KETOROLAC TROMETHAMINE 15 MG/ML IJ SOLN: 15 MG/ML | INTRAMUSCULAR | Qty: 1

## 2022-01-11 MED FILL — ENOXAPARIN SODIUM 100 MG/ML IJ SOSY: 100 MG/ML | INTRAMUSCULAR | Qty: 1

## 2022-01-11 NOTE — ED Notes (Signed)
Pt alert and oriented. Pt received discharge paperwork. No further questions. Pt ambulatory with steady gait noted.        Vedia Coffer, RN  01/11/22 [phone]

## 2022-01-11 NOTE — Discharge Instructions (Signed)
Return tomorrow for an ultrasound of your left lower extremity to rule out DVT.

## 2022-01-11 NOTE — ED Provider Notes (Signed)
[facility]  EMERGENCY DEPARTMENT ENCOUNTER      Pt Name: Betty Dudley  MRN: [id number]  Birthdate 02/22/1961  Date of evaluation: 01/11/2022  Provider: Vito Backers, MD    CHIEF COMPLAINT       Chief Complaint   Patient presents with    Leg Pain     Pt states left lower leg pain for last few days, states she has a "lump" sticking out on the left lower leg, states recently traveled, hx of injury to the left lower leg last year.         HISTORY OF PRESENT ILLNESS    HPI  Patient is a 61 y.o. female who presents with left lower extremity pain. Patient states the pain is located on the lateral aspect of her left lower leg.  She denies any recent trauma to the area.  No recent falls.  She noticed some swelling at the area which seems to "stick out."  She has had some recent travel.  No history of prior blood clots.  She is not currently on anticoagulation.  No numbness or tingling in that leg. No chest pain or shortness of breath.     Nursing Notes were reviewed.    REVIEW OF SYSTEMS       Review of Systems   Constitutional:  Negative for chills and fever.   HENT:  Negative for congestion and sore throat.    Respiratory:  Negative for cough and shortness of breath.    Cardiovascular:  Negative for chest pain and leg swelling.   Gastrointestinal:  Negative for abdominal pain, diarrhea, nausea and vomiting.   Genitourinary:  Negative for dysuria.   Musculoskeletal:  Negative for back pain and neck pain.   Skin:  Negative for rash and wound.   Allergic/Immunologic: Negative for immunocompromised state.   Neurological:  Negative for weakness, numbness and headaches.   Psychiatric/Behavioral:  Negative for agitation and confusion.    All other systems reviewed and are negative.    Except as noted above the remainder of the review of systems was reviewed and negative.       PAST MEDICAL HISTORY     Past Medical History:   Diagnosis Date    Abdominal pain     Asthma     Atelectasis     Colitis     Congenital pectus  excavatum     Depression     Fatty liver     High cholesterol     Osteoarthritis     Pelvic pain in female     Vaginal atrophy     Venous insufficiency        SURGICAL HISTORY       Past Surgical History:   Procedure Laterality Date    TONSILLECTOMY  1980       CURRENT MEDICATIONS       Previous Medications    ALBUTEROL SULFATE HFA (PROVENTIL;VENTOLIN;PROAIR) 108 (90 BASE) MCG/ACT INHALER    1 puff as needed Inhalation every 4 hrs    BUDESONIDE-FORMOTEROL (SYMBICORT) 160-4.5 MCG/ACT AERO    2 puffs Inhalation Twice a day    CETIRIZINE HCL (ZYRTEC ALLERGY) 10 MG CAPS    1 tablet Orally Once a day for 30 day(s)    DIPHENHYDRAMINE HCL (BENADRYL ALLERGY PO)    Benadryl Allergy    ESTRADIOL (ESTRACE VAGINAL) 0.1 MG/GM VAGINAL CREAM    Place 2 g vaginally daily 1/2 - 1gram twice per week in vagina  IBUPROFEN (ADVIL;MOTRIN) 200 MG TABLET    2-3 tablet with food or milk as needed Orally Three times a day       ALLERGIES     Diclofenac and Penicillins    FAMILY HISTORY       Family History   Problem Relation Age of Onset    Dementia Father         SOCIAL HISTORY       Social History     Socioeconomic History    Marital status: Married   Tobacco Use    Smoking status: Former     Packs/day: 1.00     Years: 20.00     Pack years: 20.00     Types: Cigarettes    Smokeless tobacco: Never   Vaping Use    Vaping Use: Never used   Substance and Sexual Activity    Alcohol use: Never    Drug use: Never       SCREENINGS         Glasgow Coma Scale  Eye Opening: Spontaneous  Best Verbal Response: Oriented  Best Motor Response: Obeys commands  Glasgow Coma Scale Score: 15                     CIWA Assessment  BP: (!) 180/108  Heart Rate: 85                 PHYSICAL EXAM    (up to 7 for level 4, 8 or more for level 5)     ED Triage Vitals [01/11/22 1653]   BP Temp Temp Source Heart Rate Resp SpO2 Height Weight   (!) 180/108 98.3 F (36.8 C) Oral 85 18 98 % 5\' 6"  (1.676 m) 240 lb (108.9 kg)       Physical Exam  Vitals and nursing note  reviewed.   Constitutional:       General: She is not in acute distress.     Appearance: Normal appearance. She is normal weight.   HENT:      Head: Normocephalic and atraumatic.      Right Ear: External ear normal.      Left Ear: External ear normal.      Nose: Nose normal.      Mouth/Throat:      Mouth: Mucous membranes are moist.   Eyes:      Extraocular Movements: Extraocular movements intact.      Conjunctiva/sclera: Conjunctivae normal.      Pupils: Pupils are equal, round, and reactive to light.   Cardiovascular:      Rate and Rhythm: Normal rate and regular rhythm.      Pulses: Normal pulses.      Heart sounds: Normal heart sounds.   Pulmonary:      Effort: Pulmonary effort is normal.      Breath sounds: Normal breath sounds.   Abdominal:      Palpations: Abdomen is soft.      Tenderness: There is no abdominal tenderness.   Musculoskeletal:         General: Normal range of motion.      Cervical back: Normal range of motion and neck supple.      Comments: LLE neurovascularly intact   Skin:     General: Skin is warm and dry.      Capillary Refill: Capillary refill takes less than 2 seconds.      Comments: Tender 1 to 2 cm area of swelling on the lateral  left lower leg but there is no overlying erythema or warmth.  No fluctuance or induration.   Neurological:      Mental Status: She is alert and oriented to person, place, and time.   Psychiatric:         Mood and Affect: Mood normal.         Behavior: Behavior normal.       DIAGNOSTIC RESULTS   PROCEDURES:  Unless otherwise noted below, none     Procedures    EKG: All EKG's are interpreted by the Emergency Department Physician who either signs or Co-signs this chart in the absence of a cardiologist.      RADIOLOGY:   Non-plain film images such as CT, Ultrasound and MRI are read by the radiologist. Plain radiographic images are visualized and preliminarily interpreted by the emergency physician with the below findings:    Interpretation per the Radiologist  below, if available at the time of this note:    No orders to display       ED BEDSIDE ULTRASOUND:   Performed by ED Physician - none    LABS:  Labs Reviewed - No data to display    All other labs were within normal range or not returned as of this dictation.    EMERGENCY DEPARTMENT COURSE/REASSESSMENT and MDM:   Vitals:    Vitals:    01/11/22 1653   BP: (!) 180/108   Pulse: 85   Resp: 18   Temp: 98.3 F (36.8 C)   TempSrc: Oral   SpO2: 98%   Weight: 108.9 kg   Height: 5\' 6"  (1.676 m)       ED Course:       MDM    Patient is a 61 y.o. female who presents with left lower extremity pain. Vitals reassuring and pt is afebrile. On exam, pt has a tender 1 to 2 cm area of swelling on the lateral left lower leg but there is no overlying erythema or warmth.  No fluctuance or induration. X-ray is negative for fracture or acute abnormality. This was interpreted by the radiologist and I agree with this interpretation. Pt concerned about blood clot. She was given dose of lovenox here in the ED and will return tomorrow morning for DVT ultrasound of her LLE as vascular is currently unavailable at this time today.     CONSULTS:  None    FINAL IMPRESSION    No diagnosis found.      DISPOSITION/PLAN   DISPOSITION        PATIENT REFERRED TO:  No follow-up provider specified.    DISCHARGE MEDICATIONS:  New Prescriptions    No medications on file     Controlled Substances Monitoring:     No flowsheet data found.    (Please note that portions of this note were completed with a voice recognition program.  Efforts were made to edit the dictations but occasionally words are mis-transcribed.)    67, MD (electronically signed)  Attending Emergency Physician           Vito Backers, MD  01/11/22 [phone]

## 2022-01-12 ENCOUNTER — Emergency Department: Admit: 2022-01-12 | Payer: BLUE CROSS/BLUE SHIELD | Primary: Family Medicine

## 2022-01-12 ENCOUNTER — Inpatient Hospital Stay
Admit: 2022-01-12 | Discharge: 2022-01-12 | Disposition: A | Discharge status: Home / Self Care | Payer: BLUE CROSS/BLUE SHIELD | Attending: Emergency Medicine

## 2022-01-12 DIAGNOSIS — M79605 Pain in left leg: Secondary | ICD-10-CM

## 2022-01-12 LAB — VAS DUP LOWER EXTREMITY VENOUS LEFT: Body Surface Area: 2.25 m2

## 2022-01-12 NOTE — ED Provider Notes (Signed)
[facility]  EMERGENCY DEPARTMENT ENCOUNTER      Pt Name: Betty Dudley  MRN: [id number]  Birthdate 05/20/1961  Date of evaluation: 01/12/2022  Provider: Liborio NixonMatthew Benjamin Kimara Bencomo, MD    CHIEF COMPLAINT       Chief Complaint   Patient presents with    Leg Pain     Return f/u LLE DVT study, co/o lft hip pain today as well         HISTORY OF PRESENT ILLNESS    HPI  61 y.o. female presents to the ER by vehicle with complaints of leg pain.  Is evaluated here yesterday with a negative left tib-fib x-rays and is brought back for a DVT study.  She denies any cough or hemoptysis.  She tells me that she is seeing wound management for the left leg issues in the past after a traumatic event.  She has no cough or hemoptysis.    Nursing Notes were reviewed.    REVIEW OF SYSTEMS       Review of Systems   Constitutional:  Negative for unexpected weight change.   HENT:  Negative for nosebleeds and trouble swallowing.    Eyes:  Negative for pain.   Respiratory:  Negative for shortness of breath.    Gastrointestinal:  Negative for abdominal pain.   Endocrine: Negative for cold intolerance and heat intolerance.   Musculoskeletal:  Negative for gait problem.   Neurological:  Negative for seizures.   Hematological:  Negative for adenopathy.   Psychiatric/Behavioral:  Negative for behavioral problems.      Except as noted above the remainder of the review of systems was reviewed and negative.       PAST MEDICAL HISTORY     Past Medical History:   Diagnosis Date    Abdominal pain     Asthma     Atelectasis     Colitis     Congenital pectus excavatum     Depression     Fatty liver     High cholesterol     Osteoarthritis     Pelvic pain in female     Vaginal atrophy     Venous insufficiency        SURGICAL HISTORY       Past Surgical History:   Procedure Laterality Date    TONSILLECTOMY  1980       CURRENT MEDICATIONS       Previous Medications    ALBUTEROL SULFATE HFA (PROVENTIL;VENTOLIN;PROAIR) 108 (90 BASE) MCG/ACT INHALER    1 puff as  needed Inhalation every 4 hrs    BUDESONIDE-FORMOTEROL (SYMBICORT) 160-4.5 MCG/ACT AERO    2 puffs Inhalation Twice a day    CETIRIZINE HCL (ZYRTEC ALLERGY) 10 MG CAPS    1 tablet Orally Once a day for 30 day(s)    DIPHENHYDRAMINE HCL (BENADRYL ALLERGY PO)    Benadryl Allergy    ESTRADIOL (ESTRACE VAGINAL) 0.1 MG/GM VAGINAL CREAM    Place 2 g vaginally daily 1/2 - 1gram twice per week in vagina    IBUPROFEN (ADVIL;MOTRIN) 200 MG TABLET    2-3 tablet with food or milk as needed Orally Three times a day       ALLERGIES     Diclofenac and Penicillins    FAMILY HISTORY       Family History   Problem Relation Age of Onset    Dementia Father         SOCIAL HISTORY  Social History     Socioeconomic History    Marital status: Married     Spouse name: None    Number of children: None    Years of education: None    Highest education level: None   Tobacco Use    Smoking status: Former     Packs/day: 1.00     Years: 20.00     Pack years: 20.00     Types: Cigarettes    Smokeless tobacco: Never   Vaping Use    Vaping Use: Never used   Substance and Sexual Activity    Alcohol use: Never    Drug use: Never       SCREENINGS                               CIWA Assessment  BP: (!) 166/110  Heart Rate: 74                 PHYSICAL EXAM    (up to 7 for level 4, 8 or more for level 5)     ED Triage Vitals [01/12/22 0947]   BP Temp Temp Source Heart Rate Resp SpO2 Height Weight   (!) 166/110 98.7 F (37.1 C) Oral 74 16 100 % 5\' 6"  (1.676 m) 240 lb 1.3 oz (108.9 kg)       Physical Exam  Vitals and nursing note reviewed.   Constitutional:       General: She is not in acute distress.     Appearance: Normal appearance.   HENT:      Head: Normocephalic and atraumatic.      Mouth/Throat:      Mouth: Mucous membranes are moist.   Eyes:      Extraocular Movements: Extraocular movements intact.      Pupils: Pupils are equal, round, and reactive to light.   Cardiovascular:      Rate and Rhythm: Normal rate and regular rhythm.   Pulmonary:       Effort: Pulmonary effort is normal.      Breath sounds: Normal breath sounds.   Abdominal:      General: Bowel sounds are normal.      Palpations: Abdomen is soft.   Musculoskeletal:         General: Normal range of motion.      Cervical back: Normal range of motion and neck supple.      Comments: She has full strength and full range of motion of all joints of all 4 extremities with full examination of the left lower extremity revealing mild tenderness to the lateral aspect of the leg around a superficial varicosity.  There is no lymphangitis or phlebitis not no palpable cords.  The compartment is soft.   Skin:     General: Skin is warm and dry.   Neurological:      General: No focal deficit present.      Mental Status: She is alert and oriented to person, place, and time.   Psychiatric:         Mood and Affect: Mood normal.         Thought Content: Thought content normal.       DIAGNOSTIC RESULTS   PROCEDURES:  Unless otherwise noted below, none     Procedures    EKG: All EKG's are interpreted by the Emergency Department Physician who either signs or Co-signs this chart in the absence of a cardiologist.  RADIOLOGY:   Non-plain film images such as CT, Ultrasound and MRI are read by the radiologist. Plain radiographic images are visualized and preliminarily interpreted by the emergency physician with the below findings:    Interpretation per the Radiologist below, if available at the time of this note:    Vascular duplex lower extremity venous left             ED BEDSIDE ULTRASOUND:   Performed by ED Physician - none    LABS:  Labs Reviewed - No data to display    All other labs were within normal range or not returned as of this dictation.    EMERGENCY DEPARTMENT COURSE/REASSESSMENT and MDM:   Vitals:    Vitals:    01/12/22 0947   BP: (!) 166/110   Pulse: 74   Resp: 16   Temp: 98.7 F (37.1 C)   TempSrc: Oral   SpO2: 100%   Weight: 108.9 kg   Height: 5\' 6"  (1.676 m)       ED Course:       MDM  Number of  Diagnoses or Management Options  Diagnosis management comments: I have independently reviewed and agree with the vascular ultrasound technicians interpretation of the negative DVT study.  Follow-up with her primary care physician as well as her vascular specialist.       Amount and/or Complexity of Data Reviewed  Tests in the radiology section of CPT: ordered and reviewed    Risk of Complications, Morbidity, and/or Mortality  Presenting problems: moderate  Diagnostic procedures: low  Management options: low        CONSULTS:  None    FINAL IMPRESSION      1. Left leg pain          DISPOSITION/PLAN   DISPOSITION Decision To Discharge 01/12/2022 11:30:08 AM      PATIENT REFERRED TO:  03/14/2022, MD  [address]  [place]  [phone]    Call today        DISCHARGE MEDICATIONS:  New Prescriptions    No medications on file     Controlled Substances Monitoring:     No flowsheet data found.    (Please note that portions of this note were completed with a voice recognition program.  Efforts were made to edit the dictations but occasionally words are mis-transcribed.)    [phone], MD (electronically signed)  Attending Emergency Physician           Liborio Nixon, MD  01/12/22 1130

## 2022-01-12 NOTE — Telephone Encounter (Addendum)
Unable to leave message vm full, will try again later.  Care Transitions Initial Follow Up Call    Outreach made within 2 business days of discharge: Yes    Patient: Betty Dudley Patient DOB: 03/01/1961   MRN: [id number]  Reason for Admission:  LEFT LEG PAIN  Discharge Date: 01/12/22       Spoke with: PATIENT     Discharge department/facility: [facility]    TCM Interactive Patient Contact:  Was patient able to fill all prescriptions: PT WA NOT GIVEN DISCHARGED MEDICATION   Was patient instructed to bring all medications to the follow-up visit: Yes  Is patient taking all medications as directed in the discharge summary? PATIENT WAS NOT GIVEN DISCHARGE MEDICATION   Does patient understand their discharge instructions: Yes  Does patient have questions or concerns that need addressed prior to 7-14 day follow up office visit: yes - PATIENT HAS APPT WITH DR. Shella Spearing ON 01/19/22    Scheduled appointment with PCP within 7-14 days:PATIENT HAS APPT WITH DR. Shella Spearing ON 01/19/22 WITH  VASCULAR MEDICINE    Follow Up: WILL FOLLOW UP AFTER VASCULAR APPOINTMENT ON  01/19/22  Future Appointments   Date Time Provider Department Center   01/19/2022  1:30 PM Betty Dorthula Perfect, DO [facility] [address] [place]       Shirline Frees, MA

## 2022-01-19 ENCOUNTER — Ambulatory Visit
Admit: 2022-01-19 | Discharge: 2022-01-19 | Payer: BLUE CROSS/BLUE SHIELD | Attending: Internal Medicine | Primary: Family Medicine

## 2022-01-19 DIAGNOSIS — M79605 Pain in left leg: Secondary | ICD-10-CM

## 2022-01-19 NOTE — Assessment & Plan Note (Signed)
Seen in the ED without any injury reported. Pain was of abrupt onset which argues against venous disease as a culprit. Arterial exam normal. Xray and venous duplex both normal. Recommended compression, ice and NSAIDS for 2 weeks. Elevate when seated. If pain continues, will order MRI

## 2022-01-19 NOTE — Progress Notes (Signed)
PATIENT: Betty Dudley (61 y.o. 1961-06-03 female) is here today (01/19/2022) for:     Chief Complaint   Patient presents with    Follow-up    Leg Pain           HPI: Patient presents with pain in Left leg, radiates to her left hip.  She has compression wrapped around her leg but only around her calve. Pain is present all the time but more after exertion, feels like shin splints. Went to ED over [place] weekend. No DVT seen in either leg in vascular study done.       REVIEW OF SYSTEMS  Review of Systems   Constitutional:  Negative for fatigue and fever.   Respiratory:  Negative for shortness of breath and wheezing.    Cardiovascular:  Negative for chest pain, palpitations and leg swelling.   Gastrointestinal:  Negative for abdominal distention and abdominal pain.   Musculoskeletal:  Positive for arthralgias, gait problem and myalgias. Negative for joint swelling.   Skin:  Negative for color change, rash and wound.   Neurological:  Negative for weakness and numbness.     PHYSICAL EXAM    AT, PT and DP pulses B/L are multiphasic and of good amplitude on doppler. There is no discoloration. Cap refill in all toes is normal. Dec ROM of the left ankle which is painful. No obvious joint deformity. Ankle and calf measurements are symmetric at 8.5 and 15.5 B/L. Small bruise that appears to be healing well on the medial proximal lower leg just distal to the knee. No other obvious injuries.       PAST MEDICAL HISTORY      Diagnosis Date    Abdominal pain     Asthma     Atelectasis     Colitis     Congenital pectus excavatum     Depression     Fatty liver     High cholesterol     Osteoarthritis     Pelvic pain in female     Vaginal atrophy     Venous insufficiency        PAST SURGICAL HISTORY  Past Surgical History:   Procedure Laterality Date    TONSILLECTOMY  1980       FAMILY HISTORY  family history includes Dementia in her father.    SOCIAL HISTORY  Social History     Tobacco Use    Smoking status: Former     Packs/day: 1.00      Years: 20.00     Pack years: 20.00     Types: Cigarettes    Smokeless tobacco: Never   Vaping Use    Vaping Use: Never used   Substance Use Topics    Alcohol use: Never    Drug use: Never           MEDICATIONS    Current Outpatient Medications:     estradiol (ESTRACE VAGINAL) 0.1 MG/GM vaginal cream, Place 2 g vaginally daily 1/2 - 1gram twice per week in vagina, Disp: 45 g, Rfl: 3    Cetirizine HCl (ZYRTEC ALLERGY) 10 MG CAPS, 1 tablet Orally Once a day for 30 day(s), Disp: , Rfl:     diphenhydrAMINE HCl (BENADRYL ALLERGY PO), Benadryl Allergy, Disp: , Rfl:     albuterol sulfate HFA (PROVENTIL;VENTOLIN;PROAIR) 108 (90 Base) MCG/ACT inhaler, 1 puff as needed Inhalation every 4 hrs, Disp: , Rfl:     budesonide-formoterol (SYMBICORT) 160-4.5 MCG/ACT AERO, 2 puffs Inhalation Twice a day, Disp: ,  Rfl:     ibuprofen (ADVIL;MOTRIN) 200 MG tablet, 2-3 tablet with food or milk as needed Orally Three times a day, Disp: , Rfl:     ALLERGIES  Allergies   Allergen Reactions    Diclofenac      Other reaction(s): diarrhea    Penicillins      Other reaction(s): RASH       Vitals  BP (!) 147/91   Pulse 78   Resp 18   Ht 5\' 6"  (1.676 m)   Wt 240 lb (108.9 kg)   LMP  (LMP Unknown)   BMI 38.74 kg/m         Wound:   No open wounds                  Assessment and Plan:         Left leg pain   Seen in the ED without any injury reported. Pain was of abrupt onset which argues against venous disease as a culprit. Arterial exam normal. Xray and venous duplex both normal. Recommended compression, ice and NSAIDS for 2 weeks. Elevate when seated. If pain continues, will order MRI         Follow up:      Return in about 2 weeks (around 02/02/2022).

## 2022-01-21 ENCOUNTER — Encounter: Attending: Obstetrics & Gynecology | Primary: Family Medicine

## 2022-01-24 ENCOUNTER — Ambulatory Visit
Admit: 2022-01-24 | Discharge: 2022-01-24 | Payer: BLUE CROSS/BLUE SHIELD | Attending: Family Medicine | Primary: Family Medicine

## 2022-01-24 DIAGNOSIS — J0100 Acute maxillary sinusitis, unspecified: Secondary | ICD-10-CM

## 2022-01-24 LAB — AMB POC RAPID STREP A: Group A Strep Antigen, POC: NEGATIVE

## 2022-01-24 LAB — POC COVID-19 & INFLUENZA COMBO (LIAT IN HOUSE)
INFLUENZA A: NOT DETECTED
INFLUENZA B: NOT DETECTED
SARS-CoV-2: NOT DETECTED

## 2022-01-24 MED ORDER — METHYLPREDNISOLONE 4 MG PO TBPK
4 MG | PACK | ORAL | 0 refills | Status: AC
Start: 2022-01-24 — End: 2022-01-30

## 2022-01-24 MED ORDER — DOXYCYCLINE HYCLATE 100 MG PO TABS
100 MG | ORAL_TABLET | Freq: Two times a day (BID) | ORAL | 0 refills | Status: AC
Start: 2022-01-24 — End: 2022-01-31

## 2022-01-24 NOTE — Progress Notes (Signed)
Patient: Betty Dudley  MRN: [id number]  Age: 61 y.o.  DOB: 02-02-1961      SUBJECTIVE    History of Present Illness:   61 y.o. female presents to the clinic for:  Chief Complaint   Patient presents with    Pharyngitis     SORE THROAT STARTED ABOUT 2-3 DAYS AGO,     Sputum     DARK THICK GREEN MUCUS, FOR ABOUT 2-3 DAYS ALSO      HPI  Symptoms started 3 or so days ago. Nasal congestion, postnasal drainage, sore throat. Occasional cough. She has been doing nasal rinses. Thick and green nasal discharge. Denies fevers, chills, chest pain, SOB, abdominal pain. She takes Motrin around the clock due to leg pain. Took Mucinex this morning. Takes Zyrtec for her allergies. Her grandsons were sick last week with URI symptoms.      Medical History  The following portions of the patient's history were reviewed and updated as appropriate: allergies, current medications, past family history, past medical history, past social history, past surgical history, problem list, pertinent lab work and pertinent imaging.    Allergies   Allergen Reactions    Diclofenac      Other reaction(s): diarrhea    Penicillins      Other reaction(s): RASH     Patient Active Problem List   Diagnosis    Abdominal pain    Asthma    Atelectasis    Congenital pectus excavatum    Disorder of lipid metabolism    Epigastric pain    Fatty liver    Female genital symptoms    Menopausal and female climacteric states    Obesity    Primary osteoarthritis of right knee    Standard chest x-ray abnormal    Vaginal atrophy    Venous insufficiency    Uterine prolapse    Rectocele    Left leg pain      Current Medications    Current Outpatient Medications:     Cetirizine HCl (ZYRTEC ALLERGY) 10 MG CAPS, 1 tablet Orally Once a day for 30 day(s), Disp: , Rfl:     diphenhydrAMINE HCl (BENADRYL ALLERGY PO), PRN, Disp: , Rfl:     albuterol sulfate HFA (PROVENTIL;VENTOLIN;PROAIR) 108 (90 Base) MCG/ACT inhaler, 1 puff as needed Inhalation every 4 hrs, Disp: , Rfl:      budesonide-formoterol (SYMBICORT) 160-4.5 MCG/ACT AERO, 2 puffs Inhalation Twice a day, Disp: , Rfl:     ibuprofen (ADVIL;MOTRIN) 200 MG tablet, 2-3 tablet with food or milk as needed Orally Three times a day, Disp: , Rfl:     estradiol (ESTRACE VAGINAL) 0.1 MG/GM vaginal cream, Place 2 g vaginally daily 1/2 - 1gram twice per week in vagina, Disp: 45 g, Rfl: 3      OBJECTIVE:  Vitals:    01/24/22 1019   BP: 122/74   Site: Left Upper Arm   Position: Sitting   Cuff Size: Medium Adult   Pulse: 89   Resp: 16   Temp: 99.2 F (37.3 C)   TempSrc: Oral   SpO2: 97%   Weight: 240 lb (108.9 kg)   Height: '5\' 6"'  (1.676 m)     Body mass index is 38.74 kg/m.    Physical Exam  General: alert, well-developed, in no distress  Head: atraumatic, normocephalic, maxillary sinus tenderness  Eyes: EOMI, conjunctiva normal  Ears: Pinnae and TMs normal  Nose: Normal external appearance, +congestion and green discharge  Oropharynx: Good dentition, tongue and pharynx  with no lesions, exudates, or erythema  Neck: supple, full ROM  Cardio: RRR, no murmurs, radial pulses 2+  Resp: Normal work of breathing, clear to auscultation BL  Neuro: alert and oriented x3, normal speech, normal gait       ASSESSMENT & PLAN  Results for orders placed or performed in visit on 01/24/22   POC Strep A Assay w/Optic (60454)   Result Value Ref Range    Valid Internal Control, POC PASS     Group A Strep Antigen, POC Negative Negative   POC COVID-19 & Influenza Combo ([facility])   Result Value Ref Range    SARS-CoV-2 Not Detected Not Detected    INFLUENZA A Not Detected Not Detected    INFLUENZA B Not Detected Not Detected     Brief history and plan:     1. Acute non-recurrent maxillary sinusitis  -     doxycycline hyclate (VIBRA-TABS) 100 MG tablet; Take 1 tablet by mouth 2 times daily for 7 days, Disp-14 tablet, R-0Normal  -     methylPREDNISolone (MEDROL DOSEPACK) 4 MG tablet; Take by mouth as instructed., Disp-1 kit, R-0Normal  2. Sore throat  -     POC  Strep A Assay w/Optic (09811)  -     POC COVID-19 & Influenza Combo ([facility])  3. Nasal congestion     Vitals stable, afebrile. Worsening facial pain and thick green nasal discharge. Will treat with abx course. Pt instructed to finish all abx. Continue symptomatic care. Encouraged good hydration, warm tea with 1 tbsp honey daily, warm salt water gargles, rest, tylenol/ibuprofen prn pain.    DISPOSITION:    Discharged. Patient instructed to contact Primary Care Provider if no improvement or symptoms worsen. Contact Urgent Care if unable to reach Primary Care Provider.      Pollie Friar, MD  [facility]

## 2022-02-02 ENCOUNTER — Ambulatory Visit
Admit: 2022-02-02 | Discharge: 2022-02-02 | Payer: BLUE CROSS/BLUE SHIELD | Attending: Internal Medicine | Primary: Family Medicine

## 2022-02-02 DIAGNOSIS — M79605 Pain in left leg: Secondary | ICD-10-CM

## 2022-02-02 NOTE — Progress Notes (Signed)
PATIENT: Betty Dudley (61 y.o. [birth year] female) is here today (02/03/2022) for:     Chief Complaint   Patient presents with    Follow-up           HPI: pt. Presents with pain of the LT leg. She was at [facility] recently and given hydrocodone. Pain continues in the left calf only. Not associated with knee or ankle. No edema, bruising. Pain is not predictable unless she his on her feet a great deal. Occasionally hurts at rest and wakes her from sleep. Occasionally hurts when she walks but other times it is not painful.       REVIEW OF SYSTEMS  Review of Systems   Constitutional:  Negative for fatigue.   Respiratory:  Negative for shortness of breath and wheezing.    Cardiovascular:  Negative for chest pain, palpitations and leg swelling.   Gastrointestinal:  Negative for abdominal distention and abdominal pain.   Musculoskeletal:  Positive for gait problem. Negative for back pain, joint swelling and neck pain.   Skin:  Negative for color change, rash and wound.   Neurological:  Negative for weakness and numbness.     PHYSICAL EXAM    No edema, DP, AT and PT pulses are monophasic and of good amplitude on doppler. ROM at the knee and ankle are normal. No joint edema or obvious deformity. No pain with palpation or pressure    PAST MEDICAL HISTORY      Diagnosis Date    Abdominal pain     Asthma     Atelectasis     Colitis     Congenital pectus excavatum     Depression     Fatty liver     High cholesterol     Osteoarthritis     Pelvic pain in female     Vaginal atrophy     Venous insufficiency        PAST SURGICAL HISTORY  Past Surgical History:   Procedure Laterality Date    TONSILLECTOMY  1980       FAMILY HISTORY  family history includes Dementia in her father.    SOCIAL HISTORY  Social History     Tobacco Use    Smoking status: Former     Packs/day: 1.00     Years: 20.00     Pack years: 20.00     Types: Cigarettes    Smokeless tobacco: Never   Vaping Use    Vaping Use: Never used   Substance Use Topics    Alcohol use: Never     Drug use: Never           MEDICATIONS    Current Outpatient Medications:     estradiol (ESTRACE VAGINAL) 0.1 MG/GM vaginal cream, Place 2 g vaginally daily 1/2 - 1gram twice per week in vagina, Disp: 45 g, Rfl: 3    Cetirizine HCl (ZYRTEC ALLERGY) 10 MG CAPS, 1 tablet Orally Once a day for 30 day(s), Disp: , Rfl:     diphenhydrAMINE HCl (BENADRYL ALLERGY PO), PRN, Disp: , Rfl:     albuterol sulfate HFA (PROVENTIL;VENTOLIN;PROAIR) 108 (90 Base) MCG/ACT inhaler, 1 puff as needed Inhalation every 4 hrs, Disp: , Rfl:     budesonide-formoterol (SYMBICORT) 160-4.5 MCG/ACT AERO, 2 puffs Inhalation Twice a day, Disp: , Rfl:     ibuprofen (ADVIL;MOTRIN) 200 MG tablet, 2-3 tablet with food or milk as needed Orally Three times a day, Disp: , Rfl:     ALLERGIES  Allergies  Allergen Reactions    Diclofenac      Other reaction(s): diarrhea    Penicillins      Other reaction(s): RASH       Vitals  BP (!) 149/93   Pulse 73   Resp 18   LMP  (LMP Unknown)         Wound:   No wound is present                  Assessment and Plan:         Leg pain, anterior, left  Vascular studies, vascular exam, physical exam all normal. xrays normal. Will get MRI and refer to ortho. I would not refill her hydrocodone today. Explained that I have no idea what I'm treating so narcotics would be inappropriate. Continue NSAIDS and tylenol.          Follow up:      Return if symptoms worsen or fail to improve.

## 2022-02-03 NOTE — Assessment & Plan Note (Signed)
Vascular studies, vascular exam, physical exam all normal. xrays normal. Will get MRI and refer to ortho. I would not refill her hydrocodone today. Explained that I have no idea what I'm treating so narcotics would be inappropriate. Continue NSAIDS and tylenol.

## 2022-02-05 ENCOUNTER — Ambulatory Visit
Admit: 2022-02-05 | Discharge: 2022-02-05 | Payer: BLUE CROSS/BLUE SHIELD | Attending: Surgical | Primary: Family Medicine

## 2022-02-05 DIAGNOSIS — M79662 Pain in left lower leg: Secondary | ICD-10-CM

## 2022-02-05 MED ORDER — CELECOXIB 200 MG PO CAPS
200 MG | ORAL_CAPSULE | ORAL | 2 refills | Status: AC
Start: 2022-02-05 — End: 2022-06-03

## 2022-02-05 NOTE — Progress Notes (Signed)
ORTHOPAEDIC SURGERY CLINIC NOTE    Chief Complaint   Patient presents with    New Patient     Left shin pain with ROM issues  Patient has a history of bilateral OA.       History of Present Illness:  Patient is a 61 year old female referred by Dr. Shella Spearing with a chief complaint of pain in the left anterior lower leg.  She describes a history of having a fall over a year ago and sustaining a significant contusion to the distal aspect of the anterior left lower leg.  She shows me pictures of this where there was a lot of swelling and ecchymosis.  She states the symptoms seem to resolve overall but thereafter developed pain and tenderness and subsequently changes in what she perceives as normal sensation in the mid third to proximal aspect of the anterior lower leg.  She has had a thorough vascular work-up by Dr. Shella Spearing which has been negative for any vascular abnormality.  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 Luiz Iron.  She states she had an uneventful surgery and recovery from this procedure.  She states she has noticed more recently that over the past month as a symptoms have become worse in the anterior aspect of the lower leg she has had some pain in the left buttock lateral hip and proximal lateral thigh on the left side.  She does not describe any back pain.  She denies any lower extremity weakness or instability.  States symptoms have not affected the movement of any of the joints of the lower extremity.  She denies any bowel or bladder dysfunction.  She denies any instability.  She does not describe complete loss of sensation.  States symptoms seem to be more evident with prolonged standing or walking and seem to get better with rest.  More recently she has taken ibuprofen and this seems to help to some extent as has wearing a compressive type wrap  over the lower leg.  More recently she did have an MRI and x-rays and these results were reviewed.  She denies any knee pain, tenderness or swelling.  She request evaluation and treatment.  Denies complaints referable to shortness of breath or chest pain or DVT or infection.    Review of Systems:  See HPI for pertinent Review of System Information.  Allergies   Allergen Reactions    Diclofenac      Other reaction(s): diarrhea    Penicillins      Other reaction(s): RASH      Current Outpatient Medications   Medication Sig Dispense Refill    celecoxib (CELEBREX) 200 MG capsule Take 1 tablet by mouth daily with food. 30 capsule 2    estradiol (ESTRACE VAGINAL) 0.1 MG/GM vaginal cream Place 2 g vaginally daily 1/2 - 1gram twice per week in vagina 45 g 3    Cetirizine HCl (ZYRTEC ALLERGY) 10 MG CAPS 1 tablet Orally Once a day for 30 day(s)      diphenhydrAMINE HCl (BENADRYL ALLERGY PO) PRN      albuterol sulfate HFA (PROVENTIL;VENTOLIN;PROAIR) 108 (90 Base) MCG/ACT inhaler 1 puff as needed Inhalation every 4 hrs      budesonide-formoterol (SYMBICORT) 160-4.5 MCG/ACT AERO 2 puffs Inhalation Twice a day      ibuprofen (ADVIL;MOTRIN) 200 MG tablet 2-3 tablet with food  or milk as needed Orally Three times a day       No current facility-administered medications for this visit.     Past Medical History:   Diagnosis Date    Abdominal pain     Asthma     Atelectasis     Colitis     Congenital pectus excavatum     Depression     Fatty liver     High cholesterol     Osteoarthritis     Pelvic pain in female     Vaginal atrophy     Venous insufficiency       Past Surgical History:   Procedure Laterality Date    TONSILLECTOMY  65     Family History   Problem Relation Age of Onset    Dementia Father      Social History     Socioeconomic History    Marital status: Married     Spouse name: Not on file    Number of children: Not on file    Years of education: Not on file    Highest education level: Not on file   Occupational History     Not on file   Tobacco Use    Smoking status: Former     Packs/day: 1.00     Years: 20.00     Pack years: 20.00     Types: Cigarettes    Smokeless tobacco: Never   Vaping Use    Vaping Use: Never used   Substance and Sexual Activity    Alcohol use: Never    Drug use: Never    Sexual activity: Not on file   Other Topics Concern    Not on file   Social History Narrative    Not on file     Social Determinants of Health     Financial Resource Strain: Not on file   Food Insecurity: Not on file   Transportation Needs: Not on file   Physical Activity: Not on file   Stress: Not on file   Social Connections: Not on file   Intimate Partner Violence: Not on file   Housing Stability: Not on file      Vitals:    02/05/22 0945   Weight: 240 lb (108.9 kg)   Height:  (1.676 m)     Physical Examination:  General:  well-nourished, well-developed, alert and oriented.   Psych/Neuro:  Speech clear, appropriate mood and affect.  HEENT:  normocephalic, atraumatic.  Chest:  normal inspiration and expiration. Symmetrical chest movement. No evidence of labored breathing.  Skin:  no obvious rashes or lesions. See Musculoskeletal exam.  Musculoskeletal:    Examination of the left lower extremity reveals the skin to be intact. There is no warmth or erythema or rashes or lesions. Good muscle tone and skin turgor. Good sensation and capillary refill 5 digits with a 2+ dorsalis pedis and posterior tibialis pulse. Calf and thigh compartments soft and nontender, no cords, negative Homans sign. No swelling in the thigh or calf. Dorsiflexion, plantar flexion, EHL, eversion, inversion, quad, hamstring and hip flexor strength are 4+ out of 5 and equal right to left.  Equal leg lengths are noted.  She has symmetrical muscle tone, skin turgor and temperature.  There is minimal swelling at the anterior aspect of the mid lower leg.  There is no induration, warmth, erythema or any subcutaneous fluid collection.  There is mild tenderness in this area.   Symptoms or not provoked in  any way with passive or active dorsiflexion or plantarflexion.  Achilles is intact with Thompson's testing.  There is no deformity noted.  She has full range of motion of the ankle and knee without pain.  There is no knee or ankle effusion.  There is no medial joint line tenderness at the knee.  Negative McMurray's.  Patella tracking is central.  She has full extension at the knee with flexion to 120 degrees without pain.  There is no pain with hip range of motion.  She does have tenderness in the left gluteal muscles and at the area of the trochanteric bursa without warmth or erythema or induration or fluid collection noted.  She has a negative straight leg raise.  There is no paraspinal spasm or tenderness of the lumbar spine.  No tenderness at the area of the SI joint.  There is no pain in the posterior lower leg with passive or active calf stretching.  She ambulates with a stable, normal gait.    Imaging:    No imaging performed in the office today however I did independently review prior x-rays of the tibia and fibula as well as radiologist report.  These show no acute bony abnormality such as fracture, dislocation or avulsion or stress-related changes.  No mass or cyst defect.  Additionally visible ankle and knee joint spaces are well-preserved.  I also reviewed venous Doppler results which show no evidence of DVT.  Additionally I did pull up images from the patient's MRI of the left lower leg as well as reviewed the radiologist report.  These show mild subcutaneous swelling in the lower leg without evidence of acute muscular or ligamentous injury.  No mass or cyst or subcutaneous fluid collection.  Bony anatomy overall images normally.  There is an incidental finding of a medial meniscal tear of the right knee however this does not correlate with any of the patient's symptoms or exam findings.        Assessment & Plan:   Diagnosis Orders   1. Pain in left shin  [facility] - [place], [address]      2. Leg pain, anterior, left        3. Left hip pain          I did spend a significant length of time today reviewing patient's prior imaging results and I reviewed these in detail with the patient.  X-rays overall are normal.  MRI findings discussed as above.  I did explain to her that given her history of a significant contusion to the anterior lower leg I have seen symptoms related to this last for months at a time and she does have some degree of subcutaneous edema and it could be that she still has some symptoms related to this.  I do not see any obvious findings concerning for periosteal reaction or subperiosteal edema that may be indicative of stress fracture or shinsplints which she inquired about.  Exam findings do not obviously support this is a diagnosis either.  I do think she would benefit from a course of outpatient physical therapy as well as anti-inflammatory medication which I discussed with her in detail and she was in agreement with.  Physical therapy was ordered.  Celebrex was prescribed with appropriate precautions.  Medication  appropriate dosing, precautions and potential side effects were discussed with the patient at length today.  Patient expressed understanding of discontinuing medications immediately if side effects occur and contacting the office immediately.  Seek immediate medical attention if anaphylactic reaction occurs.  Additionally patient understood not to take medications in the same class of medications while taking the medications prescribed today.  Additionally she describes the aforementioned surgery related to a lipoma at the area of her sciatic nerve.  I explained to her that what she could be experiencing is symptoms that are lumbar radicular in nature.  She does have some pain up at the left hip and buttock.  Given that her neuromuscular exam is normal and stable at this time I do think the aforementioned plan of starting with  physical therapy and Celebrex as prescribed is a reasonable approach and she is in agreement with this.  I did recommend relatively short-term follow-up in 3 weeks time to see how she is doing after taking the Celebrex and proceeding with a course of physical therapy.  Patient was in agreement with this plan.  Additionally she list diclofenac causing diarrhea as an allergy.  She cannot remember the specifics of this however and states she has tolerated all other anti-inflammatories well so I think it is reasonable to try the Celebrex which she is in agreement with.  Plan for follow-up in 3 weeks time as above.  We will continue to follow this along and proceed with further work-up as indicated based on her symptoms and her progress.  I made recommendations for appropriate activity and exercise and discussed any appropriate restrictions and modifications.  Follow-up sooner should any questions, concerns,  worsening symptoms or new symptoms arise. Patient  and caregivers expressed understanding and agreement with the plan. All questions answered.    CC Dr. Shella SpearingBoyer    Thank you for the referral.  A total of 60   minutes was spent today regarding this encounter.  This included preparation to see the patient.  Review of the patient's history including prior records and studies as well as meeting with the patient and examining them, discussion of their care and orders and documentation.       Orders Placed This Encounter    [facility] - [place], [address]     Referral Priority:   Routine     Referral Type:   Eval and Treat     Referral Reason:   Specialty Services Required     Referral Location:   PT/OT, [facility]     Requested Specialty:   Physical Therapist     Number of Visits Requested:   1    celecoxib (CELEBREX) 200 MG capsule     Sig: Take 1 tablet by mouth daily with food.     Dispense:  30 capsule     Refill:  2          Return in about 3 weeks (around 02/26/2022).        Luvenia HellerJohn J Tamanna Whitson, PA  Physician Assistant  Orthopaedic Surgery    Electronically signed by Luvenia HellerJohn J Alayne Estrella, PA on 02/05/2022 at 4:45 PM

## 2022-02-17 ENCOUNTER — Ambulatory Visit
Admit: 2022-02-17 | Discharge: 2022-02-17 | Payer: BLUE CROSS/BLUE SHIELD | Attending: Family Medicine | Primary: Family Medicine

## 2022-02-17 DIAGNOSIS — J020 Streptococcal pharyngitis: Secondary | ICD-10-CM

## 2022-02-17 LAB — AMB POC RAPID STREP A: Group A Strep Antigen, POC: POSITIVE

## 2022-02-17 LAB — POC COVID-19 & INFLUENZA COMBO (LIAT IN HOUSE)
INFLUENZA A: NOT DETECTED
INFLUENZA B: NOT DETECTED
SARS-CoV-2: NOT DETECTED

## 2022-02-17 MED ORDER — CEFDINIR 300 MG PO CAPS
300 MG | ORAL_CAPSULE | Freq: Two times a day (BID) | ORAL | 0 refills | Status: AC
Start: 2022-02-17 — End: 2022-02-27

## 2022-02-17 NOTE — Progress Notes (Signed)
Patient: Betty Dudley  MRN: [id number]  Age: 61 y.o.  DOB: 23-Sep-1961      SUBJECTIVE    History of Present Illness:   60 y.o. female presents to the clinic for:  Chief Complaint   Patient presents with    Pharyngitis     Pt stated she started taking zyrtec and benydral due to her assuming the symptoms were caused by allergies patient stated this has ongoing for about 3-4 days / pt stated she's unable to sleep drink or eat due to the sore throat / pt also stated she was seen last month for same symptoms which got better but never fully reside but now pt states the symptoms are coming back and getting worse     Congestion    Headache     Pt stated headaches are mild      HPI  Here for sore throat. Seen 1 month ago for same. Completed abx and steroid course. States symptoms improved for 1-2 weeks. Last 2-3 days sore throat returned. Some facial pain, nasal congestion, postnasal drainage. She is most concerned about her throat. Denies fevers, chills, chest pain, SOB, vomiting. She has been taking Zyrtec BID in case it was allergies.    She has seen ENT in the past. Had a sinus surgery > 10 years ago.     She had facial swelling to penicillin in 1979. She tested negative for allergy to penicillin by her allergist. So he told her she doesn't have a true allergy.       Medical History  The following portions of the patient's history were reviewed and updated as appropriate: allergies, current medications, past family history, past medical history, past social history, past surgical history, problem list, pertinent lab work and pertinent imaging.    Allergies   Allergen Reactions    Penicillins Anaphylaxis    Diclofenac      Other reaction(s): diarrhea     Patient Active Problem List   Diagnosis    Abdominal pain    Asthma    Atelectasis    Congenital pectus excavatum    Disorder of lipid metabolism    Epigastric pain    Fatty liver    Female genital symptoms    Menopausal and female climacteric states    Obesity     Primary osteoarthritis of right knee    Standard chest x-ray abnormal    Vaginal atrophy    Venous insufficiency    Uterine prolapse    Rectocele    Leg pain, anterior, left    Pain in left shin    Left hip pain      Current Medications    Current Outpatient Medications:     celecoxib (CELEBREX) 200 MG capsule, Take 1 tablet by mouth daily with food., Disp: 30 capsule, Rfl: 2    estradiol (ESTRACE VAGINAL) 0.1 MG/GM vaginal cream, Place 2 g vaginally daily 1/2 - 1gram twice per week in vagina, Disp: 45 g, Rfl: 3    Cetirizine HCl (ZYRTEC ALLERGY) 10 MG CAPS, 1 tablet Orally Once a day for 30 day(s), Disp: , Rfl:     diphenhydrAMINE HCl (BENADRYL ALLERGY PO), PRN, Disp: , Rfl:     albuterol sulfate HFA (PROVENTIL;VENTOLIN;PROAIR) 108 (90 Base) MCG/ACT inhaler, 1 puff as needed Inhalation every 4 hrs, Disp: , Rfl:     budesonide-formoterol (SYMBICORT) 160-4.5 MCG/ACT AERO, 2 puffs Inhalation Twice a day, Disp: , Rfl:     ibuprofen (ADVIL;MOTRIN) 200 MG tablet, 2-3  tablet with food or milk as needed Orally Three times a day (Patient not taking: Reported on 02/17/2022), Disp: , Rfl:       OBJECTIVE:  Vitals:    02/17/22 1626 02/17/22 1659   BP: 126/84    Site: Right Upper Arm    Position: Sitting    Cuff Size: Large Adult    Pulse: (!) 105 100   Resp: 18    Temp: 99.2 F (37.3 C)    TempSrc: Oral    SpO2: 94% 95%   Weight: 240 lb (108.9 kg)    Height: 5\' 6"  (1.676 m)      Body mass index is 38.74 kg/m.    Physical Exam  General: alert, well-developed, well-nourished, in no distress  Head: atraumatic, normocephalic, no sinus tenderness  Eyes: EOMI, conjunctiva normal  Ears: Pinnae and TMs normal  Nose: Normal external appearance, +erythema  Oropharynx: Good dentition, tongue normal, pharyngeal erythema, s/p tonsillectomy  Neck: supple, full ROM  Cardio: RRR, no murmurs, radial pulses 2+  Resp: Normal work of breathing, clear to auscultation BL  Neuro: alert and oriented x3, normal speech, normal gait       ASSESSMENT &  PLAN  Results for orders placed or performed in visit on 02/17/22   POC COVID-19 & Influenza Combo ([facility])   Result Value Ref Range    SARS-CoV-2 Not Detected Not Detected    INFLUENZA A Not Detected Not Detected    INFLUENZA B Not Detected Not Detected   AMB POC RAPID STREP A   Result Value Ref Range    Valid Internal Control, POC PASS     Group A Strep Antigen, POC Positive Negative     Brief history and plan:     1. Acute streptococcal pharyngitis  -     cefdinir (OMNICEF) 300 MG capsule; Take 1 capsule by mouth 2 times daily for 10 days, Disp-20 capsule, R-0Normal  2. Sore throat  -     POC COVID-19 & Influenza Combo ([facility])  -     AMB POC RAPID STREP A     Strep positive.    Plan:  Complete total abx course. Encouraged good hydration, warm tea with honey, and warm salt water gargles. Can use chloraseptic spray and throat lozenges prn. Tylenol/Motrin prn pain/fever. Return if symptoms fail to improve. Recommend she follow up with ENT.      02/19/22, MD  [facility]

## 2022-02-26 ENCOUNTER — Ambulatory Visit
Admit: 2022-02-26 | Discharge: 2022-02-26 | Payer: BLUE CROSS/BLUE SHIELD | Attending: Surgical | Primary: Family Medicine

## 2022-02-26 DIAGNOSIS — M79605 Pain in left leg: Secondary | ICD-10-CM

## 2022-02-26 NOTE — Progress Notes (Signed)
ORTHOPAEDIC SURGERY CLINIC NOTE    Chief Complaint   Patient presents with    Hip Pain     Left hip and left shin follow up       History of Present Illness:  Patient is a 61 year old female returns in follow-up regarding pain at the anterior lateral aspect of the left lower leg as well as the left hip.  She states since her last visit she has significantly improved and is very pleased with her current status.  She states she went to physical therapy where it was noted that she had been compensating for the trauma she had to the anterior medial aspect of the lower leg by putting more stress on the lateral aspect of the leg and this was likely the cause of her symptoms.  They have been working on gait stabilization as well as deep tissue massaging and ankle, knee and hip range of motion and the patient states that her symptoms have essentially resolved.  She has been taking Celebrex and tolerating it well and thinks this has been helping her as well.  She denies any new complaints, injury or trauma.  She is relieved by how well she is doing because she had the aforementioned symptoms for the better part of a year.  Denies complaints referable to shortness of breath or chest pain or DVT or infection.    Current Outpatient Medications:     cefdinir (OMNICEF) 300 MG capsule, Take 1 capsule by mouth 2 times daily for 10 days, Disp: 20 capsule, Rfl: 0    celecoxib (CELEBREX) 200 MG capsule, Take 1 tablet by mouth daily with food., Disp: 30 capsule, Rfl: 2    estradiol (ESTRACE VAGINAL) 0.1 MG/GM vaginal cream, Place 2 g vaginally daily 1/2 - 1gram twice per week in vagina, Disp: 45 g, Rfl: 3    Cetirizine HCl (ZYRTEC ALLERGY) 10 MG CAPS, 1 tablet Orally Once a day for 30 day(s), Disp: , Rfl:     diphenhydrAMINE HCl (BENADRYL ALLERGY PO), PRN, Disp: , Rfl:     albuterol sulfate HFA (PROVENTIL;VENTOLIN;PROAIR) 108 (90 Base) MCG/ACT inhaler, 1 puff as needed Inhalation every 4 hrs, Disp: , Rfl:     budesonide-formoterol  (SYMBICORT) 160-4.5 MCG/ACT AERO, 2 puffs Inhalation Twice a day, Disp: , Rfl:     ibuprofen (ADVIL;MOTRIN) 200 MG tablet, 2-3 tablet with food or milk as needed Orally Three times a day (Patient not taking: Reported on 02/17/2022), Disp: , Rfl:   Vitals:    02/26/22 0913   Weight: 240 lb (108.9 kg)   Height: 5\' 6"  (1.676 m)     Body mass index is 38.74 kg/m.  Review of Systems:  See HPI for pertinent Review of Systems information.  Physical Examination:  General:  well-nourished, well-developed, alert and oriented.   Psych/Neuro:  Speech clear, appropriate mood and affect.  HEENT:  normocephalic, atraumatic.  Chest:  normal inspiration and expiration. Symmetrical chest movement. No evidence of labored breathing.  Skin:  no obvious rashes or lesions. See Musculoskeletal exam.  Musculoskeletal:    Examination of the left lower extremity reveals the skin to be intact. There is no warmth or erythema or rashes or lesions. Good muscle tone and skin turgor. Good sensation and capillary refill 5 digits with a 2+ dorsalis pedis and posterior tibialis pulse. Calf and thigh compartments soft and nontender, no cords, negative Homans sign. No swelling in the thigh or calf. Dorsiflexion, plantar flexion, EHL, eversion, inversion, quad, hamstring and hip flexor  strength are 4+ out of 5 and equal right to left.  Negative straight leg raise.  She has full and symmetrical range of motion of the left hip, left knee and left ankle.  There is no warmth or erythema or rashes or lesions.  There is no fluid collection.  Symmetrical muscle tone, skin turgor and temperature noted.  There is no tenderness in the anterior lateral lower leg as previously noted on exam.  No tenderness throughout the thigh or hip.  She ambulates with a stable, normal gait.    Imaging:   No image results found.           Assessment & Plan:     Diagnosis Orders   1. Leg pain, anterior, left        2. Left hip pain            Patient is doing very well and is  pleased with her current status.  I did recommend she continue physical therapy as to the physical therapist credit, they have helped this diagnose and treat the underlying cause of her symptoms and the patient are both appreciative of that.  She would like to continue on Celebrex because she does think that is helped as well.  She will continue this with appropriate dosing appropriate precautions.  She would like to take that for another 2 weeks.  I stressed the importance of continuing outpatient PT and also working on a home exercise program.  Recommended follow-up in 6 weeks time for repeat evaluation.  I made recommendations for appropriate activity and exercise and discussed any appropriate restrictions and modifications.  Follow-up sooner should any questions, concerns,  worsening symptoms or new symptoms arise. Patient  and caregivers expressed understanding and agreement with the plan. All questions answered.  A total of 20   minutes was spent today regarding this encounter.  This included preparation to see the patient.  Review of the patient's history including prior records and studies as well as meeting with the patient and examining them, discussion of their care and orders and documentation.      No orders of the defined types were placed in this encounter.       Return in about 6 weeks (around 04/09/2022).     Luvenia Heller, PA  Physician Assistant  Orthopaedic Surgery    Electronically signed by Luvenia Heller, PA on 02/26/2022 at 1:23 PM

## 2022-02-28 ENCOUNTER — Ambulatory Visit
Admit: 2022-02-28 | Discharge: 2022-02-28 | Payer: BLUE CROSS/BLUE SHIELD | Attending: Family Medicine | Primary: Family Medicine

## 2022-02-28 DIAGNOSIS — J3089 Other allergic rhinitis: Secondary | ICD-10-CM

## 2022-02-28 LAB — AMB POC RAPID STREP A: Group A Strep Antigen, POC: NEGATIVE

## 2022-02-28 MED ORDER — DEXAMETHASONE SODIUM PHOSPHATE 10 MG/ML IJ SOLN
10 MG/ML | Freq: Once | INTRAMUSCULAR | Status: AC
Start: 2022-02-28 — End: 2022-02-28
  Administered 2022-02-28: 22:00:00 10 mg via INTRAMUSCULAR

## 2022-02-28 MED ORDER — FLUTICASONE PROPIONATE 50 MCG/ACT NA SUSP
50 MCG/ACT | Freq: Every day | NASAL | 0 refills | Status: AC
Start: 2022-02-28 — End: ?

## 2022-02-28 NOTE — Progress Notes (Signed)
Patient: SYLVAN LAHM  MRN: [id number]  Age: 61 y.o.  DOB: [birth year]      SUBJECTIVE    History of Present Illness:   61 y.o. female presents to the clinic for:  Chief Complaint   Patient presents with    Pharyngitis     See 5/16 note, still has a sore throat     HPI  She completed a 10 day course of cefdinir for strep throat last night. She did see improvement, but didn't fully resolve. She states she still has PND and a sore throat. She states she has PND and sinus pain all of the time from allergies. She states it feels a little more sore than typical allergies. She is taking Zyrtec once daily. She has sneezed a few times today. Denies fevers, chills, chest pain, SOB, cough.      Medical History  The following portions of the patient's history were reviewed and updated as appropriate: allergies, current medications, past family history, past medical history, past social history, past surgical history, problem list, pertinent lab work and pertinent imaging.    Allergies   Allergen Reactions    Penicillins Anaphylaxis    Diclofenac      Other reaction(s): diarrhea     Patient Active Problem List   Diagnosis    Abdominal pain    Asthma    Atelectasis    Congenital pectus excavatum    Disorder of lipid metabolism    Epigastric pain    Fatty liver    Female genital symptoms    Menopausal and female climacteric states    Obesity    Primary osteoarthritis of right knee    Standard chest x-ray abnormal    Vaginal atrophy    Venous insufficiency    Uterine prolapse    Rectocele    Leg pain, anterior, left    Pain in left shin    Left hip pain      Current Medications    Current Outpatient Medications:     celecoxib (CELEBREX) 200 MG capsule, Take 1 tablet by mouth daily with food., Disp: 30 capsule, Rfl: 2    estradiol (ESTRACE VAGINAL) 0.1 MG/GM vaginal cream, Place 2 g vaginally daily 1/2 - 1gram twice per week in vagina, Disp: 45 g, Rfl: 3    Cetirizine HCl (ZYRTEC ALLERGY) 10 MG CAPS, 1 tablet Orally Once a day for  30 day(s), Disp: , Rfl:     diphenhydrAMINE HCl (BENADRYL ALLERGY PO), PRN, Disp: , Rfl:     albuterol sulfate HFA (PROVENTIL;VENTOLIN;PROAIR) 108 (90 Base) MCG/ACT inhaler, 1 puff as needed Inhalation every 4 hrs, Disp: , Rfl:     budesonide-formoterol (SYMBICORT) 160-4.5 MCG/ACT AERO, 2 puffs Inhalation Twice a day, Disp: , Rfl:     ibuprofen (ADVIL;MOTRIN) 200 MG tablet, 2-3 tablet with food or milk as needed Orally Three times a day (Patient not taking: Reported on 02/17/2022), Disp: , Rfl:       OBJECTIVE:  Vitals:    02/28/22 1714   BP: 134/82   Pulse: 87   Resp: 16   Temp: 98.3 F (36.8 C)   SpO2: 96%   Weight: 240 lb (108.9 kg)   Height: 5\' 6"  (1.676 m)     Body mass index is 38.74 kg/m.    Physical Exam  General: alert, well-developed, in no distress  Head: atraumatic, normocephalic  Eyes: EOMI, conjunctiva normal  Ears: Pinnae and TMs normal  Nose: Normal external appearance, +erythema  Oropharynx: Good  dentition, tongue and pharynx with no lesions or exudates, mild pharyngeal erythema and PND, uvula midline, s/p tonsillectomy  Neck: supple, full ROM  Cardio: RRR, no murmurs, radial pulses 2+  Resp: Normal work of breathing, clear to auscultation BL  Neuro: alert and oriented x3, normal speech, normal gait       ASSESSMENT & PLAN  Results for orders placed or performed in visit on 02/28/22   POC Strep A Assay w/Optic (91505)   Result Value Ref Range    Valid Internal Control, POC PASS     Group A Strep Antigen, POC Negative Negative     Brief history and plan:     1. Non-seasonal allergic rhinitis, unspecified trigger  -     fluticasone (FLONASE) 50 MCG/ACT nasal spray; 1 spray by Each Nostril route daily, Disp-32 g, R-0Normal  2. Sore throat  -     POC Strep A Assay w/Optic (69794)  -     dexamethasone (DECADRON) injection 10 mg; 10 mg, IntraMUSCular, ONCE, 1 dose, On Sat 02/28/22 at 1800  3. PND (post-nasal drip)     She is 10 days s/p cefdinir for strep throat. Symptoms improved, but not fully gone.  Vitals are stable, afebrile. Well-appearing. These are chronic symptoms. She was instructed to continue Zyrtec qD. We will add Flonase qD. Push fluids. She will follow up with her allergist as she has been inconsistent with her allergy shots and is not up to her maintenance dose. Follow up with PCP.      Sena Slate, MD  [facility]

## 2022-04-08 ENCOUNTER — Ambulatory Visit: Admit: 2022-04-08 | Discharge: 2022-04-08 | Payer: BLUE CROSS/BLUE SHIELD | Primary: Family Medicine

## 2022-04-08 ENCOUNTER — Ambulatory Visit: Admit: 2022-04-08 | Discharge: 2022-04-08 | Payer: BLUE CROSS/BLUE SHIELD | Attending: Family | Primary: Family Medicine

## 2022-04-08 DIAGNOSIS — J019 Acute sinusitis, unspecified: Secondary | ICD-10-CM

## 2022-04-08 DIAGNOSIS — R0602 Shortness of breath: Secondary | ICD-10-CM

## 2022-04-08 LAB — AMB POC BASIC METABOLIC PANEL
Anion Gap, POC: 16 mmol/l
BUN, POC: 9 MG/DL
CO2, POC: 25 mEq/L
Calcium, ionized, POC: 1.17 mmol/L
Chloride, POC: 104 MMOL/L
Creatinine, POC: 0.6 MG/DL
Glucose, POC: 116 MG/DL
Hematocrit, POC: 40 %
Hemoglobin, POC: 13.6 G/DL
Potassium, POC: 4.1 MMOL/L
Sodium, POC: 139 MMOL/L

## 2022-04-08 LAB — POC COVID-19 & INFLUENZA COMBO (LIAT IN HOUSE)
INFLUENZA A: NOT DETECTED
INFLUENZA B: NOT DETECTED
SARS-CoV-2: NOT DETECTED

## 2022-04-08 LAB — AMB POC COMPLETE CBC, AUTOMATED
Hematocrit, POC: 40.5 %
Hemoglobin, POC: 13.4 G/DL
Lymphocye Absolute, POC: 1.8
Lymphocyte %: 23.2 %
MCV: 94.6
Neutrophil %: 72.8 %
Neutrophil Absolute, POC: 5.7
Other WBC %, POC: 4 %
Other WBC Absolute, POC: 0.3
Platelet Count, POC: 260 10*3/uL
WBC, POC: 7.8 10*3/uL

## 2022-04-08 MED ORDER — DOXYCYCLINE HYCLATE 100 MG PO CAPS
100 MG | ORAL_CAPSULE | Freq: Two times a day (BID) | ORAL | 0 refills | Status: AC
Start: 2022-04-08 — End: 2022-04-15

## 2022-04-08 MED ORDER — PREDNISONE 20 MG PO TABS
20 MG | ORAL_TABLET | Freq: Every day | ORAL | 0 refills | Status: AC
Start: 2022-04-08 — End: 2022-04-13

## 2022-04-08 NOTE — Progress Notes (Signed)
Subjective:      Patient ID: Georga Hacking [facility]     Chief Complaint Congestion (Pt has been sick for the past 4 days. Pt is having congestion, cough and nasal drainage. Pt has asthma and has been using her inhalers. )       Time Patient seen by Provider: 1:11 PM    HPI The patient is a 61 y.o. female presents with complaints of sinus pressure, postnasal drainage, mild sore throat, cough, and shortness of breath.  Patient states that her symptoms initially began 2 to 3 weeks ago with increased sinus pressure.  Patient states she was seen by Dr. Newman Pies, asthma and allergy, and was prescribed a combination of fluticasone nasal spray and Astelin nasal spray.  Patient states that she was not able to get these prescriptions filled right away.  In the meantime, she states that she began experiencing increased chest congestion.  Patient noted that she had shortness of breath with exertion this morning.  She also states that her at home SPO2 was 90% on room air.  Patient states that she normally is at 95 to 96% on room air.  Patient denies fever, abdominal pain, nausea, vomiting, diarrhea.    Review of Systems: As noted in the HPI    Past Medical History:   Diagnosis Date    Abdominal pain     Asthma     Atelectasis     Colitis     Congenital pectus excavatum     Depression     Fatty liver     High cholesterol     Osteoarthritis     Pelvic pain in female     Vaginal atrophy     Venous insufficiency         Past Surgical History:   Procedure Laterality Date    LIPOMA RESECTION      TONSILLECTOMY  1980    TUBAL LIGATION          Allergies   Allergen Reactions    Penicillins Anaphylaxis    Diclofenac Diarrhea        Current Outpatient Medications   Medication Sig Dispense Refill    doxycycline hyclate (VIBRAMYCIN) 100 MG capsule Take 1 capsule by mouth 2 times daily for 7 days 14 capsule 0    predniSONE (DELTASONE) 20 MG tablet Take 2 tablets by mouth daily for 5 days 10 tablet 0    fluticasone (FLONASE) 50 MCG/ACT nasal spray 1 spray  by Each Nostril route daily 32 g 0    Cetirizine HCl (ZYRTEC ALLERGY) 10 MG CAPS 1 tablet Orally Once a day for 30 day(s)      diphenhydrAMINE HCl (BENADRYL ALLERGY PO) PRN      albuterol sulfate HFA (PROVENTIL;VENTOLIN;PROAIR) 108 (90 Base) MCG/ACT inhaler 1 puff as needed Inhalation every 4 hrs      budesonide-formoterol (SYMBICORT) 160-4.5 MCG/ACT AERO 2 puffs Inhalation Twice a day      celecoxib (CELEBREX) 200 MG capsule Take 1 tablet by mouth daily with food. (Patient not taking: Reported on 04/08/2022) 30 capsule 2    estradiol (ESTRACE VAGINAL) 0.1 MG/GM vaginal cream Place 2 g vaginally daily 1/2 - 1gram twice per week in vagina 45 g 3    ibuprofen (ADVIL;MOTRIN) 200 MG tablet 2-3 tablet with food or milk as needed Orally Three times a day (Patient not taking: Reported on 02/17/2022)       No current facility-administered medications for this visit.  BP (!) 140/78   Pulse 89   Temp 98.9 F (37.2 C)   Resp 18   Ht 5\' 6"  (1.676 m)   Wt 240 lb (108.9 kg)   LMP  (LMP Unknown)   SpO2 93%   BMI 38.74 kg/m       Objective:   Physical Exam  Vitals and nursing note reviewed.   Constitutional:       General: She is not in acute distress.     Appearance: Normal appearance. She is normal weight. She is not ill-appearing, toxic-appearing or diaphoretic.   HENT:      Head: Normocephalic and atraumatic.      Right Ear: Tympanic membrane, ear canal and external ear normal.      Left Ear: Tympanic membrane, ear canal and external ear normal.      Nose: Congestion and rhinorrhea present.      Mouth/Throat:      Mouth: Mucous membranes are moist.      Pharynx: No oropharyngeal exudate or posterior oropharyngeal erythema.   Eyes:      Extraocular Movements: Extraocular movements intact.      Conjunctiva/sclera: Conjunctivae normal.   Cardiovascular:      Rate and Rhythm: Normal rate and regular rhythm.      Heart sounds: Normal heart sounds. No murmur heard.    No friction rub. No gallop.   Pulmonary:      Effort:  Pulmonary effort is normal.      Breath sounds: Wheezing and rhonchi present.   Musculoskeletal:         General: Normal range of motion.      Cervical back: Normal range of motion and neck supple.   Skin:     General: Skin is warm and dry.   Neurological:      General: No focal deficit present.      Mental Status: She is alert and oriented to person, place, and time.   Psychiatric:         Mood and Affect: Mood normal.         Behavior: Behavior normal.         Thought Content: Thought content normal.         Judgment: Judgment normal.       No scans are attached to the encounter.     Assessment:   1. Acute rhinosinusitis  -     doxycycline hyclate (VIBRAMYCIN) 100 MG capsule; Take 1 capsule by mouth 2 times daily for 7 days, Disp-14 capsule, R-0Normal  -     predniSONE (DELTASONE) 20 MG tablet; Take 2 tablets by mouth daily for 5 days, Disp-10 tablet, R-0Normal  2. Acute cough  3. Shortness of breath  -     POC COVID-19 & Influenza Combo ([facility])  -     XR CHEST (2 VW); Future  -     AMB POC BASIC METABOLIC PANEL  -     AMB POC COMPLETE CBC, AUTOMATED  -     - Venipuncture       Plan:     Results for orders placed or performed in visit on 04/08/22   XR CHEST (2 VW)    Narrative    Chest PA and lateral: 04/08/22    INDICATION: "Cough, shortness of breath".    COMPARISON: Chest radiograph 08/21/2020    FINDINGS:     Lungs: Lungs are clear.    Pleura: No pleural effusions. No pneumothorax.  Cardiomediastinum: Unremarkable.    Bones: No acute osseous abnormality.        Impression    No radiographic evidence of acute cardiopulmonary process.   Results for orders placed or performed in visit on 04/08/22   36415 - Venipuncture    Narrative    VERBAL CONSENT OBTAINED, SITE RAC. SITE CLEANSED WITH ASEPTIC TECHNIQUE, 21.G NEEDLE USED WITH 1 ATTEMPT, SUCCESSFUL, PRESSURE APPLIED TO SITE, PT. TOLERATED WELL. 2 tubes were drawn   POC COVID-19 & Influenza Combo ([facility])   Result Value Ref Range     SARS-CoV-2 Not Detected Not Detected    INFLUENZA A Not Detected Not Detected    INFLUENZA B Not Detected Not Detected    Narrative    Is this test for diagnosis or screening?->Screening  Symptomatic for COVID-19 as defined by CDC?->No  Date of Symptom Onset->N/A  Hospitalized for COVID-19?->No  Admitted to ICU for COVID-19?->No  Employed in healthcare setting?->No  Resident in a congregate (group) care setting?->No  Pregnant?->No  Previously tested for COVID-19?->No   AMB POC BASIC METABOLIC PANEL   Result Value Ref Range    Sodium, POC 139 MMOL/L    Potassium, POC 4.1 MMOL/L    Chloride, POC 104 MMOL/L    CO2, POC 25 mEq/L    Calcium, ionized, POC 1.17 mmol/L    Glucose, POC 116 MG/DL    BUN, POC 9 MG/DL    Creatinine, POC 0.6 MG/DL    Anion Gap, POC 16 mmol/l    Hematocrit, POC 40 %    Hemoglobin, POC 13.6 G/DL   AMB POC COMPLETE CBC, AUTOMATED   Result Value Ref Range    Hematocrit, POC 40.5 %    Hemoglobin, POC 13.4 G/DL    WBC, POC 7.8 K/uL    Platelet Count, POC 260 K/UL    MCV 94.6     Neutrophil % 72.8 %    Lymphocyte % 23.2 %    Other WBC %, POC 4.0 %    Neutrophil Absolute, POC 5.7     Lymphocye Absolute, POC 1.8     Other WBC Absolute, POC 0.3       Coarse breath sounds auscultated on exam.  Patient's O2 sat is 93% on room air.  Reviewed vital signs from 02/28/2022 when patient was most recently seen [facility].  Her O2 sat was noted to be 95% at that time.  We did perform a chest x-ray.  Radiologist did not note any acute cardiopulmonary pathology.  CBC and BMP were unremarkable.  Patient nontoxic in appearance.  Will cover for bacterial sinusitis and lower respiratory tract pathology with doxycycline.  In addition, the patient was given a prescription for prednisone.  Instructed on how to administer and possible side effects.  Patient does have Symbicort and an albuterol rescue inhaler at home.  Discussed the importance of following up immediately for any new or worsening symptoms.  Patient  verbalized understanding and agreement plan of care.     Vernelle Emerald, APRN - NP

## 2022-04-09 ENCOUNTER — Ambulatory Visit
Admit: 2022-04-09 | Discharge: 2022-04-09 | Payer: BLUE CROSS/BLUE SHIELD | Attending: Surgical | Primary: Family Medicine

## 2022-04-09 DIAGNOSIS — M25512 Pain in left shoulder: Secondary | ICD-10-CM

## 2022-04-09 NOTE — Progress Notes (Signed)
ORTHOPAEDIC SURGERY CLINIC NOTE    Chief Complaint   Patient presents with    Follow-up     Left hip and left shin pain follow up     Shoulder Pain     Left shoulder pain          History of Present Illness:  Patient is 61 year old female returns in follow-up regarding chief complaint of left lower leg pain that began as result of a injury about a year ago.  She states she is doing very well overall and has been making good progress with outpatient physical therapy.  She has relief of pain and tenderness at the lower leg.  She continues to work on strengthening and range of motion of the ankle, knee and hip.  Hip pain has resolved.  Denies any paresthesias, weakness or instability.    Patient request evaluation and treatment of a new complaint today which is that of pain and limited motion of the left shoulder.  States this has been ongoing for years.  No precipitating event, injury or trauma.  She notices a deficit in internal rotation when compared to the right shoulder.  She has some pain with end range of motion in all planes.  Denies weakness or instability.  Denies neck pain or cervical radicular complaints.  Request evaluation and treatment.  Denies complaints referable to shortness of breath or chest pain or DVT or infection.    Current Outpatient Medications:     Azelastine HCl 137 MCG/SPRAY SOLN, , Disp: , Rfl:     EPINEPHrine (EPIPEN) 0.3 MG/0.3ML SOAJ injection, , Disp: , Rfl:     doxycycline hyclate (VIBRAMYCIN) 100 MG capsule, Take 1 capsule by mouth 2 times daily for 7 days, Disp: 14 capsule, Rfl: 0    predniSONE (DELTASONE) 20 MG tablet, Take 2 tablets by mouth daily for 5 days, Disp: 10 tablet, Rfl: 0    fluticasone (FLONASE) 50 MCG/ACT nasal spray, 1 spray by Each Nostril route daily, Disp: 32 g, Rfl: 0    celecoxib (CELEBREX) 200 MG capsule, Take 1 tablet by mouth daily with food. (Patient not taking: Reported on 04/08/2022), Disp: 30 capsule, Rfl: 2    estradiol (ESTRACE VAGINAL) 0.1 MG/GM  vaginal cream, Place 2 g vaginally daily 1/2 - 1gram twice per week in vagina, Disp: 45 g, Rfl: 3    Cetirizine HCl (ZYRTEC ALLERGY) 10 MG CAPS, 1 tablet Orally Once a day for 30 day(s), Disp: , Rfl:     diphenhydrAMINE HCl (BENADRYL ALLERGY PO), PRN, Disp: , Rfl:     albuterol sulfate HFA (PROVENTIL;VENTOLIN;PROAIR) 108 (90 Base) MCG/ACT inhaler, 1 puff as needed Inhalation every 4 hrs, Disp: , Rfl:     budesonide-formoterol (SYMBICORT) 160-4.5 MCG/ACT AERO, 2 puffs Inhalation Twice a day, Disp: , Rfl:     ibuprofen (ADVIL;MOTRIN) 200 MG tablet, 2-3 tablet with food or milk as needed Orally Three times a day (Patient not taking: Reported on 02/17/2022), Disp: , Rfl:   Vitals:    04/09/22 0921   Weight: 240 lb (108.9 kg)   Height: 5\' 6"  (1.676 m)     Body mass index is 38.74 kg/m.  Review of Systems:  See HPI for pertinent Review of Systems information.  Physical Examination:  General:  well-nourished, well-developed, alert and oriented.   Psych/Neuro:  Speech clear, appropriate mood and affect.  HEENT:  normocephalic, atraumatic.  Chest:  normal inspiration and expiration. Symmetrical chest movement. No evidence of labored breathing.  Skin:  no obvious  rashes or lesions. See Musculoskeletal exam.  Musculoskeletal:    Examination of the left lower extremity reveals the skin to be intact. There is no warmth or erythema or rashes or lesions. Good muscle tone and skin turgor. Good sensation and capillary refill 5 digits with a 2+ dorsalis pedis and posterior tibialis pulse. Calf and thigh compartments soft and nontender, no cords, negative Homans sign. No swelling in the thigh or calf. Dorsiflexion, plantar flexion, EHL, eversion, inversion, quad, hamstring and hip flexor strength are intact.  There is no swelling, ecchymosis or defects or fluid collection in the ankle anterior medial aspect of the lower leg.  There is no tenderness.  She has full range of motion of the ankle, knee and hip without pain.  Negative  straight leg raise.  She has good anterior tibialis strength.    Examination of the left upper extremity reveals the skin to be intact. There is no warmth or erythema or rashes or lesions. Good sensation and capillary refill 5 digits. Good range of motion of all 5 digits. 2+ radial pulse. Good range of motion of the wrist and elbow without pain. Triceps, biceps, wrist extension and flexion strength are intact.Forearm and upper arm compartments soft and nontender.  She does have full and symmetrical range of motion of the left shoulder when compared to the right in all planes other than internal rotation.  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  Does not appear to have global adhesive capsulitis.  She does have some discomfort at the limits of motion in all planes.  Negative speeds test.  Negative drop arm sign.  Positive empty can and Neer impingement signs.  Strength resisted abduction external rotation is 4+ out of 5 and equal right to left.  Glenohumeral joint good alignment.  No pain with rotator cuff strength testing.  Negative belly press test.    Imaging:   No image results found.           Assessment & Plan:     Diagnosis Orders   1. Left shoulder pain, unspecified chronicity  [facility] - [facility] - [place], [address]      2. Pain in left shin        3. Left hip pain  [facility] - [facility] - [place], [address]      4. Leg pain, anterior, left        5. Chronic left shoulder pain            With regards to the left lower extremity, the patient is doing very well.  She is pleased with the progress she has made with physical therapy.  No longer taking Celebrex as an anti-inflammatory.  She would like to continue outpatient PT which I think is a good idea.  I gave her an updated order.  She is pleased with her overall status of the left lower extremity at this time.  Plan for follow-up in 6 weeks time for repeat evaluation.    With regards to the left  shoulder, recommended an x-ray which she deferred.  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  Recommended a subacromial injection which she deferred.  Recommended anti-inflammatories.  She has been taking Celebrex and tolerating it well but does not feel she needs those at this time.  Discussed my reasoning for this.  Recommended a subacromial injection which she declined.  I recommended outpatient PT which she was in favor of.  This was ordered.  Recommend follow-up in 6 weeks time for repeat evaluation.  If symptoms persist I will once again discussed x-ray and subacromial injection.  I made recommendations for appropriate activity and exercise and discussed any appropriate restrictions and modifications.  Follow-up sooner should any questions, concerns,  worsening symptoms or new symptoms arise. Patient  and caregivers expressed understanding and agreement with the plan. All questions answered.  A total of 30   minutes was spent today regarding this encounter.  This included preparation to see the patient.  Review of the patient's history including prior records and studies as well as meeting with the patient and examining them, discussion of their care and orders and documentation.      Orders Placed This Encounter    [facility] - [facility] - [place], [address]     Referral Priority:   Routine     Referral Type:   Eval and Treat     Referral Reason:   Specialty Services Required     Referral Location:   PT/OT, [facility]     Requested Specialty:   Physical Therapist     Number of Visits Requested:   1    Azelastine HCl 137 MCG/SPRAY SOLN    EPINEPHrine (EPIPEN) 0.3 MG/0.3ML SOAJ injection        Return in about 6 weeks (around 05/21/2022).     This documentation was created using voice recognition software. There may be inaccuracies of transcription  that are inadvertently overlooked prior to the signature.  If there are any questions about the transcription please  contact me.     Luvenia Heller, PA  Physician Assistant  Orthopaedic Surgery    Electronically signed by Luvenia Heller, PA on 04/09/2022 at 9:59 AM

## 2022-05-19 NOTE — Telephone Encounter (Signed)
Patient is due for yearly visit. Please schedule with Dr. Herma Carson or Leavy Cella

## 2022-05-21 ENCOUNTER — Encounter: Payer: BLUE CROSS/BLUE SHIELD | Attending: Surgical | Primary: Family Medicine

## 2022-05-28 ENCOUNTER — Ambulatory Visit
Admit: 2022-05-28 | Discharge: 2022-05-28 | Payer: BLUE CROSS/BLUE SHIELD | Attending: Surgical | Primary: Family Medicine

## 2022-05-28 DIAGNOSIS — M7061 Trochanteric bursitis, right hip: Secondary | ICD-10-CM

## 2022-05-28 MED ORDER — METHYLPREDNISOLONE ACETATE 40 MG/ML IJ SUSP
40 MG/ML | Freq: Once | INTRAMUSCULAR | Status: AC
Start: 2022-05-28 — End: 2022-05-28
  Administered 2022-05-28: 18:00:00 40 mg via INTRA_ARTICULAR

## 2022-05-28 MED ORDER — CELECOXIB 200 MG PO CAPS
200 MG | ORAL_CAPSULE | ORAL | 2 refills | Status: AC
Start: 2022-05-28 — End: 2022-09-29

## 2022-05-29 DIAGNOSIS — M7061 Trochanteric bursitis, right hip: Secondary | ICD-10-CM

## 2022-05-29 NOTE — Progress Notes (Signed)
ORTHOPAEDIC SURGERY CLINIC NOTE    Chief Complaint   Patient presents with    Follow-up     Bilateral shoulder pain with the left shoulder the worst with ROM issues  Bilateral hip pain with the right hip the worst.        History of Present Illness:  Patient is 61 year old female returns in follow-up.  She states she is doing well with her shoulders and her left lower leg.  She has been in physical therapy and doing well.  She states over the past months she has developed some pain localized to the lateral aspect of the right hip with some radiation into the proximal IT band.  She denies any back pain or buttock pain or any radiating pain to the level of the knee or distally into the lower leg, foot or ankle.  Denies any injury or trauma.  She request evaluation and treatment.  Prolonged ambulation makes it worse as does direct pressure on it such as sleeping on the right hip at night.  Denies bowel or bladder dysfunction.  Denies complaints referable to shortness of breath or chest pain or DVT or infection.    Current Outpatient Medications:     celecoxib (CELEBREX) 200 MG capsule, Take 1 tablet by mouth daily with food., Disp: 30 capsule, Rfl: 2    Azelastine HCl 137 MCG/SPRAY SOLN, , Disp: , Rfl:     EPINEPHrine (EPIPEN) 0.3 MG/0.3ML SOAJ injection, , Disp: , Rfl:     fluticasone (FLONASE) 50 MCG/ACT nasal spray, 1 spray by Each Nostril route daily, Disp: 32 g, Rfl: 0    celecoxib (CELEBREX) 200 MG capsule, Take 1 tablet by mouth daily with food. (Patient not taking: Reported on 04/08/2022), Disp: 30 capsule, Rfl: 2    estradiol (ESTRACE VAGINAL) 0.1 MG/GM vaginal cream, Place 2 g vaginally daily 1/2 - 1gram twice per week in vagina, Disp: 45 g, Rfl: 3    Cetirizine HCl (ZYRTEC ALLERGY) 10 MG CAPS, 1 tablet Orally Once a day for 30 day(s), Disp: , Rfl:     diphenhydrAMINE HCl (BENADRYL ALLERGY PO), PRN, Disp: , Rfl:     albuterol sulfate HFA (PROVENTIL;VENTOLIN;PROAIR) 108 (90 Base) MCG/ACT inhaler, 1 puff as  needed Inhalation every 4 hrs, Disp: , Rfl:     budesonide-formoterol (SYMBICORT) 160-4.5 MCG/ACT AERO, 2 puffs Inhalation Twice a day, Disp: , Rfl:     ibuprofen (ADVIL;MOTRIN) 200 MG tablet, 2-3 tablet with food or milk as needed Orally Three times a day (Patient not taking: Reported on 02/17/2022), Disp: , Rfl:   Vitals:    05/28/22 0929   Weight: 240 lb (108.9 kg)   Height: 5\' 6"  (1.676 m)     Body mass index is 38.74 kg/m.  Review of Systems:  See HPI for pertinent Review of Systems information.  Physical Examination:  General:  well-nourished, well-developed, alert and oriented.   Psych/Neuro:  Speech clear, appropriate mood and affect.  HEENT:  normocephalic, atraumatic.  Chest:  normal inspiration and expiration. Symmetrical chest movement. No evidence of labored breathing.  Skin:  no obvious rashes or lesions. See Musculoskeletal exam.  Musculoskeletal:    Examination of the right lower extremity reveals the skin to be intact. There is no warmth or erythema or rashes or lesions. Good muscle tone and skin turgor. Good sensation and capillary refill 5 digits with a 2+ dorsalis pedis and posterior tibialis pulse. Calf and thigh compartments soft and nontender, no cords, negative Homans sign. Dorsiflexion, plantar flexion,  EHL, eversion, inversion, quad, hamstring and hip flexor strength are's out of 5 and equal right to left.  She does have tenderness at the area of the trochanteric bursa of the right hip without warmth or erythema or fluid collection.  Full range of motion of the right hip without pain.  Mild discomfort with resisted AB duction of the right hip.  Equal leg lengths are noted.  No groin or buttock pain with hip range of motion.  Symmetrical strength at the hip is noted.  Negative straight leg raise.  No tenderness in the buttock or SI joint or lumbar paraspinals.  No pain with knee or ankle range of motion noted.  Symmetrical skin turgor, temperature and muscle tone noted.  She does have some  discomfort with IT band stretching.    Imaging:   No image results found.           Assessment & Plan:     Diagnosis Orders   1. Trochanteric bursitis of right hip  [facility] - [facility] - [place], [address]    PR ARTHROCENTESIS ASPIR&/INJ MAJOR JT/BURSA W/O [place]    methylPREDNISolone acetate (DEPO-MEDROL) injection 40 mg    celecoxib (CELEBREX) 200 MG capsule          Patient has history, symptoms and exam consistent with trochanteric bursitis of the right hip.  I did recommend x-rays which she deferred.  She did elect to proceed with a steroid injection in the trochanteric bursa of the right hip.  After discussing risks and benefits and reasonable expectations patient elected to proceed with an injection in the right trochanteric bursa with steroid. After verbal consent and under sterile conditions I prepped the lateral aspect of the right hip at the area of the trochanteric bursa with alcohol swab. Injected 40 mg of Depo-Medrol and 1 mL Marcaine and 1 mL of lidocaine. Patient tolerated the procedure well. Band-Aid was applied.  Additionally I did recommend physical therapy.  She is already in therapy for her shoulders and her left lower leg.  Recommended she proceed with PT for the hip as well.  This will be for hip range of motion and strengthening and IT band stretching.  She was given appropriate order.  Additionally she has been benefiting from taking Celebrex.  She has been tolerating it well.  A new prescription was sent with appropriate precautions.  Recommended follow-up in 4 weeks time for repeat evaluation.  I made recommendations for appropriate activity and exercise and discussed any appropriate restrictions and modifications.  Follow-up sooner should any questions, concerns,  worsening symptoms or new symptoms arise. Patient  and caregivers expressed understanding and agreement with the plan. All questions answered.  A total of 30   minutes was spent today regarding this encounter.  This  included preparation to see the patient.  Review of the patient's history including prior records and studies as well as meeting with the patient and examining them, discussion of their care and orders and documentation.      Orders Placed This Encounter    [facility] - [facility] - [place], [address]     Referral Priority:   Routine     Referral Type:   Eval and Treat     Referral Reason:   Specialty Services Required     Referral Location:   PT/OT, [facility]     Requested Specialty:   Physical Therapist     Number of Visits Requested:   1  PR ARTHROCENTESIS ASPIR&/INJ MAJOR JT/BURSA W/O [place]    methylPREDNISolone acetate (DEPO-MEDROL) injection 40 mg    celecoxib (CELEBREX) 200 MG capsule     Sig: Take 1 tablet by mouth daily with food.     Dispense:  30 capsule     Refill:  2        Return in about 4 weeks (around 06/25/2022).     This documentation was created using voice recognition software. There may be inaccuracies of transcription  that are inadvertently overlooked prior to the signature.  If there are any questions about the transcription please contact me.     Luvenia Heller, PA  Physician Assistant  Orthopaedic Surgery    Electronically signed by Luvenia Heller, PA on 05/29/2022 at 2:49 PM

## 2022-06-03 ENCOUNTER — Ambulatory Visit
Admit: 2022-06-03 | Discharge: 2022-06-03 | Payer: BLUE CROSS/BLUE SHIELD | Attending: Family Medicine | Primary: Family Medicine

## 2022-06-03 DIAGNOSIS — Z0000 Encounter for general adult medical examination without abnormal findings: Secondary | ICD-10-CM

## 2022-06-03 NOTE — Progress Notes (Signed)
CHIEF COMPLAINT:  Chief Complaint   Patient presents with    Follow-up     Need form completed for insurance.        HISTORY OF PRESENT ILLNESS:  Betty Dudley is a 61 y.o. female  who presents for CPE today. She needs her insurance form filled out for her husbands insurance policy on her.   She has been doing well otherwise. She has some trochanteric bursitis going on. She is currently seeing Ortho and doing therapy and that helps.   She has been losing weight and getting more mobile.   She had prediabetes noted in the past but needs repeat labs as ordered already. She also had borderline HPL.     PHQ:  PHQ-9  06/03/2022   Little interest or pleasure in doing things 1   Feeling down, depressed, or hopeless 2   Trouble falling or staying asleep, or sleeping too much 3   Feeling tired or having little energy 3   Poor appetite or overeating 3   Feeling bad about yourself - or that you are a failure or have let yourself or your family down 1   Trouble concentrating on things, such as reading the newspaper or watching television 0   Moving or speaking so slowly that other people could have noticed. Or the opposite - being so fidgety or restless that you have been moving around a lot more than usual 0   Thoughts that you would be better off dead, or of hurting yourself in some way 0   PHQ-2 Score 3   PHQ-9 Total Score 13   If you checked off any problems, how difficult have these problems made it for you to do your work, take care of things at home, or get along with other people? 1       CURRENT MEDICATION LIST:    Current Outpatient Medications   Medication Sig Dispense Refill    celecoxib (CELEBREX) 200 MG capsule Take 1 tablet by mouth daily with food. 30 capsule 2    Azelastine HCl 137 MCG/SPRAY SOLN       EPINEPHrine (EPIPEN) 0.3 MG/0.3ML SOAJ injection       fluticasone (FLONASE) 50 MCG/ACT nasal spray 1 spray by Each Nostril route daily 32 g 0    Cetirizine HCl (ZYRTEC ALLERGY) 10 MG CAPS 1 tablet Orally Once a day  for 30 day(s)      diphenhydrAMINE HCl (BENADRYL ALLERGY PO) PRN      albuterol sulfate HFA (PROVENTIL;VENTOLIN;PROAIR) 108 (90 Base) MCG/ACT inhaler 1 puff as needed Inhalation every 4 hrs      budesonide-formoterol (SYMBICORT) 160-4.5 MCG/ACT AERO 2 puffs Inhalation Twice a day      estradiol (ESTRACE VAGINAL) 0.1 MG/GM vaginal cream Place 2 g vaginally daily 1/2 - 1gram twice per week in vagina 45 g 3     No current facility-administered medications for this visit.        ALLERGIES:    Allergies   Allergen Reactions    Penicillins Anaphylaxis    Diclofenac Diarrhea        HISTORY:  Past Medical History:   Diagnosis Date    Abdominal pain     Asthma     Atelectasis     Colitis     Congenital pectus excavatum     Depression     Fatty liver     High cholesterol     Osteoarthritis     Pelvic pain in female  Sleep apnea     Vaginal atrophy     Venous insufficiency       Past Surgical History:   Procedure Laterality Date    FOOT SURGERY  1978    Cyst remove right foot    LIPOMA RESECTION      TONSILLECTOMY  1980    TUBAL LIGATION        Social History     Socioeconomic History    Marital status: Married     Spouse name: Not on file    Number of children: Not on file    Years of education: Not on file    Highest education level: Not on file   Occupational History    Not on file   Tobacco Use    Smoking status: Former     Packs/day: 1.00     Years: 20.00     Pack years: 20.00     Types: Cigarettes     Quit date: 10/05/2006     Years since quitting: 15.6    Smokeless tobacco: Never   Vaping Use    Vaping Use: Never used   Substance and Sexual Activity    Alcohol use: Yes     Alcohol/week: 2.0 standard drinks     Types: 1 Glasses of wine, 1 Cans of beer per week     Comment: This is a guess i do not drink every day but occasionally i    Drug use: Never    Sexual activity: Not Currently   Other Topics Concern    Not on file   Social History Narrative    Not on file     Social Determinants of Health     Financial Resource  Strain: Not on file   Food Insecurity: Not on file   Transportation Needs: Not on file   Physical Activity: Not on file   Stress: Not on file   Social Connections: Not on file   Intimate Partner Violence: Not on file   Housing Stability: Not on file      Family History   Problem Relation Age of Onset    Alzheimer's Disease Mother     Dementia Father         Review of Systems   Constitutional: Negative.    HENT: Negative.     Respiratory: Negative.     Cardiovascular: Negative.    Gastrointestinal: Negative.    Endocrine: Negative.    Genitourinary: Negative.    Musculoskeletal:  Positive for arthralgias.   Skin: Negative.    Allergic/Immunologic: Negative.    Neurological: Negative.    Hematological: Negative.    Psychiatric/Behavioral: Negative.        PHYSICAL EXAM:  Vital Signs -   Visit Vitals  BP 136/82   Pulse 74   Temp 97.6 F (36.4 C) (Oral)   Resp 18   Ht 5\' 6"  (1.676 m)   Wt 247 lb (112 kg)   SpO2 96%   BMI 39.87 kg/m        Physical Exam  Vitals and nursing note reviewed.   Constitutional:       General: She is not in acute distress.     Appearance: Normal appearance. She is obese.   HENT:      Head: Normocephalic and atraumatic.      Mouth/Throat:      Mouth: Mucous membranes are moist.      Pharynx: Oropharynx is clear. No oropharyngeal exudate or posterior oropharyngeal erythema.  Eyes:      Extraocular Movements: Extraocular movements intact.      Conjunctiva/sclera: Conjunctivae normal.      Pupils: Pupils are equal, round, and reactive to light.   Neck:      Vascular: No carotid bruit.   Cardiovascular:      Rate and Rhythm: Normal rate and regular rhythm.      Heart sounds: Normal heart sounds. No murmur heard.  Pulmonary:      Effort: Pulmonary effort is normal. No respiratory distress.      Breath sounds: Normal breath sounds.   Abdominal:      General: Bowel sounds are normal. There is no distension.      Palpations: Abdomen is soft.      Tenderness: There is no abdominal tenderness.    Musculoskeletal:         General: No swelling or tenderness. Normal range of motion.      Cervical back: Normal range of motion and neck supple.   Lymphadenopathy:      Cervical: No cervical adenopathy.   Skin:     General: Skin is warm and dry.      Findings: No erythema or rash.   Neurological:      General: No focal deficit present.      Mental Status: She is alert and oriented to person, place, and time. Mental status is at baseline.   Psychiatric:         Mood and Affect: Mood normal.         Behavior: Behavior normal.         Thought Content: Thought content normal.         Judgment: Judgment normal.        LABS  No results found for this visit on 06/03/22.  Office Visit on 04/08/2022   Component Date Value Ref Range Status    SARS-CoV-2 04/08/2022 Not Detected  Not Detected Final    Comment: Test performed at [facility] [address], [place]  [address]  The cobas SARS-CoV-2 & Influenza A/B Nucleic acid test for use on the cobas  Liat System (cobas SARS-CoV-2 & Influenza A/B) is an automated multiplex  real-time RT-PCR assay intended for the simultaneous rapid in vitro  qualitative detection and differentiation of SARS-CoV-2, influenza A, and  influenza B virus RNA in healthcare provider-collected nasopharyngeal and  nasal swabs, from individuals suspected of respiratory viral infection  consistent with COVID-19 by their healthcare provider.  Cobas SARS-CoV-2 & Influenza A/B is intended for use in the simultaneous rapid  in vitro detection and differentiation of SARS-CoV-2, influenza A virus, and  influenza B virus nucleic acids in clinical specimens and is not intended to  detect influenza C virus. SARS-CoV-2, influenza A and influenza B viral RNA is  generally detectable in respiratory specimens during the acute phase of  infection. Positive results ar                           e indicative of active infection but do not rule  out bacterial infection or co-infection with other pathogens  not detected by  the test. Clinical correlation with patient history and other diagnostic  information is necessary to determine patient infection status. The agent  detected may not be the definite cause of disease.  Negative results do not preclude SARS-CoV-2, influenza A and/or influenza B  infection and should not be used as the sole basis for diagnosis,  treatment or  other patient management decisions. Negative results must be combined with  clinical observations, patient history, and/or epidemiological information.  The cobas SARS-CoV-2 & Influenza A/B Nucleic acid test is only for use under  the Food and Drug Administration's Emergency Use Authorization.      INFLUENZA A 04/08/2022 Not Detected  Not Detected Final    Comment: Test performed at [facility] [address] [place]  [address]      INFLUENZA B 04/08/2022 Not Detected  Not Detected Final    Comment: Test performed at [facility] [address], [place]  [address]      Sodium, POC 04/08/2022 139  MMOL/L Final    Potassium, POC 04/08/2022 4.1  MMOL/L Final    Chloride, POC 04/08/2022 104  MMOL/L Final    CO2, POC 04/08/2022 25  mEq/L Final    Calcium, ionized, POC 04/08/2022 1.17  mmol/L Final    Glucose, POC 04/08/2022 116  MG/DL Final    BUN, POC 60/73/7106 9  MG/DL Final    Creatinine, POC 04/08/2022 0.6  MG/DL Final    Anion Gap, POC 04/08/2022 16  mmol/l Final    Hematocrit, POC 04/08/2022 40  % Final    Hemoglobin, POC 04/08/2022 13.6  G/DL Final    Hematocrit, POC 04/08/2022 40.5  % Final    Hemoglobin, POC 04/08/2022 13.4  G/DL Final    WBC, POC 26/94/8546 7.8  K/uL Final    Platelet Count, POC 04/08/2022 260  K/UL Final    MCV 04/08/2022 94.6   Final    Neutrophil % 04/08/2022 72.8  % Final    Lymphocyte % 04/08/2022 23.2  % Final    Other WBC %, POC 04/08/2022 4.0  % Final    Neutrophil Absolute, POC 04/08/2022 5.7   Final    Lymphocye Absolute, POC 04/08/2022 1.8   Final    Other WBC Absolute, POC  04/08/2022 0.3   Final       IMPRESSION/PLAN    1. Physical exam  Preventive services discussed, mammogram ordered    2. Trochanteric bursitis of right hip  Following with Ortho    3. Prediabetes  Noted in the past, repeat pending    4. Borderline hyperlipidemia  Noted in the past, repeat pending    5. Obesity (BMI 30-39.9)  Complicates all aspects of care, will await upcoming labs but pt already trying to get more active to help with weight loss and improving health    6. Screening for breast cancer  - mammogram ordered    She is getting her labs with [facility]  Follow up and Dispositions:  Return in about 3 months (around 09/03/2022).

## 2022-06-19 NOTE — Telephone Encounter (Signed)
Not sure how I overlooked this, but I do not have anything for her. Was this ever located?

## 2022-07-09 ENCOUNTER — Encounter
Admit: 2022-07-09 | Discharge: 2022-07-09 | Payer: BLUE CROSS/BLUE SHIELD | Attending: Surgical | Primary: Family Medicine

## 2022-07-09 ENCOUNTER — Ambulatory Visit: Admit: 2022-07-09 | Discharge: 2022-07-09 | Payer: BLUE CROSS/BLUE SHIELD | Primary: Family Medicine

## 2022-07-09 DIAGNOSIS — M25512 Pain in left shoulder: Secondary | ICD-10-CM

## 2022-07-09 MED ORDER — METHYLPREDNISOLONE ACETATE 40 MG/ML IJ SUSP
40 MG/ML | Freq: Once | INTRAMUSCULAR | Status: AC
Start: 2022-07-09 — End: ?
  Administered 2022-07-09: 17:00:00 40 mg via INTRA_ARTICULAR

## 2022-07-09 NOTE — Progress Notes (Signed)
[place] NOTE    Chief Complaint   Patient presents with    Hip Pain     Trochanteric bursitis of right hip- Patient states she is doing much better since last visit    Shoulder Pain     Bilateral shoulder pain with ROM issues       History of Present Illness:  61 year old female returns in follow-up regarding symptoms consistent with trochanteric bursitis of the right hip.  States overall she is improved but still has some pain and tenderness at the area of the trochanteric bursa.  She has been in outpatient physical therapy.  She continues to deny any obvious lumbar radicular complaints.  Denies any injury or trauma since her last visit.    Patient also presents today requesting evaluation and treatment of bilateral shoulder pain as well as pain generated from her cervical spine.  She denies any injury or trauma.  She has been in physical therapy for her shoulders and notes that she still has significant stiffness particular with internal rotation.  She also describes limited motion of the cervical spine and states that in the past she has had x-rays which are showed some arthritis.  States she does have some pain that is present at the medial border of the bilateral scapula.  She denies any upper extremity radiating pain or paresthesias or any muscle group weakness.  Request evaluation and treatment.  She has been taking Celebrex as an anti-inflammatory.  Tolerating it well.  Denies complaints referable to shortness of breath or chest pain or DVT or infection.    Current Outpatient Medications:     celecoxib (CELEBREX) 200 MG capsule, Take 1 tablet by mouth daily with food., Disp: 30 capsule, Rfl: 2    Azelastine HCl 137 MCG/SPRAY SOLN, , Disp: , Rfl:     EPINEPHrine (EPIPEN) 0.3 MG/0.3ML SOAJ injection, , Disp: , Rfl:     fluticasone (FLONASE) 50 MCG/ACT nasal spray, 1 spray by Each Nostril route daily, Disp: 32 g, Rfl: 0    estradiol (ESTRACE VAGINAL) 0.1 MG/GM vaginal cream, Place 2 g  vaginally daily 1/2 - 1gram twice per week in vagina, Disp: 45 g, Rfl: 3    Cetirizine HCl (ZYRTEC ALLERGY) 10 MG CAPS, 1 tablet Orally Once a day for 30 day(s), Disp: , Rfl:     diphenhydrAMINE HCl (BENADRYL ALLERGY PO), PRN, Disp: , Rfl:     albuterol sulfate HFA (PROVENTIL;VENTOLIN;PROAIR) 108 (90 Base) MCG/ACT inhaler, 1 puff as needed Inhalation every 4 hrs, Disp: , Rfl:     budesonide-formoterol (SYMBICORT) 160-4.5 MCG/ACT AERO, 2 puffs Inhalation Twice a day, Disp: , Rfl:   Vitals:    07/09/22 0910   Weight: 247 lb (112 kg)   Height: 5\' 6"  (1.676 m)     Body mass index is 39.87 kg/m.  Review of Systems:  See HPI for pertinent Review of Systems information.  Physical Examination:  General:  well-nourished, well-developed, alert and oriented.   Psych/Neuro:  Speech clear, appropriate mood and affect.  HEENT:  normocephalic, atraumatic.  Chest:  normal inspiration and expiration. Symmetrical chest movement. No evidence of labored breathing.  Skin:  no obvious rashes or lesions. See Musculoskeletal exam.  Musculoskeletal:    Examination of the right lower extremity reveals the skin to be intact. There is no warmth or erythema or rashes or lesions. Good muscle tone and skin turgor. Good sensation and capillary refill 5 digits with a 2+ dorsalis pedis and posterior tibialis pulse. Calf  and thigh compartments soft and nontender, no cords, negative Homans sign. Dorsiflexion, plantar flexion, EHL, eversion, inversion, quad, hamstring and hip flexor strength are intact.  Full range of motion of the right hip, right knee and right ankle without pain.  Symmetrical skin turgor and temperature noted.  Tenderness without warmth or erythema or fluid collection at the trochanteric bursa.  Good abduction strength.  No pain with strength testing.  Negative straight leg raise.    Examination of the bilateral upper extremities reveals the skin to be intact. There is no warmth or erythema or rashes or lesions. Good sensation and  capillary refill 5 digits. Good range of motion of all 5 digits. 2+ radial pulse. Forearm and upper arm compartments soft and nontender. Triceps, biceps, wrist extension and flexion strength are 4+ out of 5 and equal right to left.  She has full range of motion of the shoulders in all planes with the exception of internal rotation.  She does have limitation bilaterally with internal rotation to about L1.  She states she used to be able to internally rotate to her scapula bilaterally.  She has overall good strength at the shoulder with 4+ out of 5 strength resisted abduction external rotation.  Negative belly press test.  Negative speeds test.  Negative Hawkin's test.  Negative O'Brien's test.  Positive Neer impingement and empty can test.  Good alignment the glenohumeral joint without warmth or erythema or effusion.  No tenderness at the acromioclavicular joint.  She does have some trigger point type tenderness along the medial border of the scapula bilaterally.  No winging of the scapula.  She does have limited range of motion and some discomfort with cervical spine range of motion.  Spurling's test does exacerbate some pain along the medial border of the scapula bilaterally.    Imaging:   XR CERVICAL SPINE (2-3 VIEWS) (CLINIC PERFORMED)  I ordered and interpreted x-rays of cervical spine.  They show evidence of   arthritic changes and degenerative disc disease.  Significant disc space   narrowing at C4-5 and C5-6.  No evidence of instability.  XR SHOULDER RIGHT (MIN 2 VIEWS) (CLINIC PERFORMED)  I ordered interpreted x-rays of bilateral shoulders today.  No evidence of   acute bony abnormality.  Good preservation of joint spaces.  Good   preservation of subacromial space.  XR SHOULDER LEFT (MIN 2 VIEWS) (CLINIC PERFORMED)  I ordered and interpreted bilateral shoulder x-rays today.  No evidence of   acute fracture, dislocation or avulsion.  Good preservation of joint   spaces.  Good preservation of subacromial  space.           Assessment & Plan:     Diagnosis Orders   1. Left shoulder pain, unspecified chronicity  XR SHOULDER LEFT (MIN 2 VIEWS) (CLINIC PERFORMED)    [facility] - [place], [address]    methylPREDNISolone acetate (DEPO-MEDROL) injection 40 mg    PR ARTHROCENTESIS ASPIR&/INJ MAJOR JT/BURSA W/O [place]      2. Right shoulder pain, unspecified chronicity  XR SHOULDER RIGHT (MIN 2 VIEWS) (CLINIC PERFORMED)    [facility] - [place], [address]    methylPREDNISolone acetate (DEPO-MEDROL) injection 40 mg    PR ARTHROCENTESIS ASPIR&/INJ MAJOR JT/BURSA W/O [place]      3. Neck pain  XR CERVICAL SPINE (2-3 VIEWS) (CLINIC PERFORMED)    [facility] - [facility] - [address]  4. Trochanteric bursitis of right hip  methylPREDNISolone acetate (DEPO-MEDROL) injection 40 mg    PR ARTHROCENTESIS ASPIR&/INJ MAJOR JT/BURSA W/O [place]      5. Cervical pain (neck)  [facility] - [facility] - [address]          I reviewed the patient's x-rays with her in detail with findings as above.  With regards to the right hip.  She does have improvement but still has ongoing symptoms referable to trochanteric bursitis.  Recommended continuation of outpatient PT.  Recommended repeat steroid injection to further decrease inflammation and pain.  Patient agreement.  After discussing risks and benefits and reasonable expectations patient elected to proceed with an injection in the right trochanteric bursa with steroid. After verbal consent and under sterile conditions I prepped the lateral aspect of the right hip at the area of the trochanteric bursa with alcohol swab. Injected 40 mg of Depo-Medrol and 1 mL Marcaine and 1 mL of lidocaine. Patient tolerated the procedure well. Band-Aid was applied.  She will continue Celebrex on an as needed and as tolerated basis with appropriate dosing and precautions.    With  regards to her upper extremity symptoms, I think her symptoms are related to her cervical spine and she does have some evidence of degenerative disc disease.  Recommended referral to Dr. Trixie Dredge for evaluation of her cervical spine and treatment recommendations.  The patient was in agreement.  Additionally we will add her cervical spine to her current PT order.  Celebrex as above.  With regards to the shoulders, she does have some symptoms consistent with rotator cuff tendinitis bilaterally and internal rotation deficit.  Recommended subacromial injection in the bilateral shoulders.  Patient agreement.  After discussing risks and benefits and reasonable expectations patient elected to proceed with a right shoulder subacromial steroid injection. After verbal consent and under sterile conditions I prepped the posterior aspect of the right shoulder with alcohol swab. I injected 40 mg of Depo-Medrol and 1 mL Marcaine and 1 mL of lidocaine subacromially. Patient tolerated this well. Band-Aid was applied.  After discussing risks and benefits and reasonable expectations the patient elected to proceed with a left shoulder subacromial injection. After verbal consent and under sterile conditions I prepped the posterior aspect of the left shoulder with alcohol swab. I injected 40 mg of Depo-Medrol and 1 mL Marcaine and 1 mL of lidocaine subacromially. Patient tolerated the procedure well. Band-Aid was applied.  Continue outpatient physical therapy.  Continue Celebrex as above.  Follow-up in 3 weeks time for repeat evaluation.  I made recommendations for appropriate activity and exercise and discussed any appropriate restrictions and modifications.  Follow-up sooner should any questions, concerns,  worsening symptoms or new symptoms arise. Patient  and caregivers expressed understanding and agreement with the plan. All questions answered.  A total of 40   minutes was spent today regarding this encounter.  This included  preparation to see the patient.  Review of the patient's history including prior records and studies as well as meeting with the patient and examining them, discussion of their care and orders and documentation.      Orders Placed This Encounter    XR SHOULDER LEFT (MIN 2 VIEWS) (CLINIC PERFORMED)     PD 1 RM 4    XR SHOULDER RIGHT (MIN 2 VIEWS) (CLINIC PERFORMED)     PD 1 RM 4    XR CERVICAL SPINE (2-3 VIEWS) (CLINIC PERFORMED)  PD1 RM 2    [facility] - [facility] - [address]     Referral Priority:   Routine     Referral Type:   Eval and Treat     Referral Reason:   Specialty Services Required     Referred to Provider:   Lanney Gins, DO     Requested Specialty:   Physical Medicine and Rehab     Number of Visits Requested:   1    [facility] - [place], [address]     Referral Priority:   Routine     Referral Type:   Eval and Treat     Referral Reason:   Specialty Services Required     Referral Location:   PT/OT, [facility]     Requested Specialty:   Physical Therapist     Number of Visits Requested:   1    PR ARTHROCENTESIS ASPIR&/INJ MAJOR JT/BURSA W/O [place]    PR ARTHROCENTESIS ASPIR&/INJ MAJOR JT/BURSA W/O [place]    PR ARTHROCENTESIS ASPIR&/INJ MAJOR JT/BURSA W/O [place]    methylPREDNISolone acetate (DEPO-MEDROL) injection 40 mg    methylPREDNISolone acetate (DEPO-MEDROL) injection 40 mg    methylPREDNISolone acetate (DEPO-MEDROL) injection 40 mg        Return in about 3 weeks (around 07/30/2022).     This documentation was created using voice recognition software. There may be inaccuracies of transcription  that are inadvertently overlooked prior to the signature.  If there are any questions about the transcription please contact me.     Luvenia Heller, PA  Physician Assistant  Orthopaedic Surgery    Electronically signed by Luvenia Heller, PA on 07/09/2022 at 12:25 PM

## 2022-07-27 ENCOUNTER — Ambulatory Visit
Admit: 2022-07-27 | Discharge: 2022-07-27 | Payer: BLUE CROSS/BLUE SHIELD | Attending: Physical Medicine & Rehabilitation | Primary: Family Medicine

## 2022-07-27 DIAGNOSIS — G8929 Other chronic pain: Secondary | ICD-10-CM
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
## 2022-07-30 ENCOUNTER — Encounter: Payer: BLUE CROSS/BLUE SHIELD | Attending: Surgical | Primary: Family Medicine

## 2022-08-03 NOTE — Progress Notes (Signed)
Patient was a "no show"  This encounter was created in error - please disregard.

## 2022-08-24 MED ORDER — ESTRADIOL 0.1 MG/GM VA CREA
0.1 MG/GM | VAGINAL | 0 refills | Status: DC
Start: 2022-08-24 — End: 2022-09-22

## 2022-08-25 ENCOUNTER — Encounter: Attending: Family Medicine | Primary: Family Medicine

## 2022-09-03 ENCOUNTER — Encounter
Admit: 2022-09-03 | Discharge: 2022-09-03 | Payer: BLUE CROSS/BLUE SHIELD | Attending: Obstetrics & Gynecology | Primary: Family Medicine

## 2022-09-03 DIAGNOSIS — N952 Postmenopausal atrophic vaginitis: Secondary | ICD-10-CM

## 2022-09-03 DIAGNOSIS — Z1231 Encounter for screening mammogram for malignant neoplasm of breast: Secondary | ICD-10-CM

## 2022-09-03 MED ORDER — ESTRADIOL 10 MCG VA TABS
10 MCG | ORAL_TABLET | VAGINAL | 3 refills | Status: AC
Start: 2022-09-03 — End: 2022-12-04

## 2022-09-03 NOTE — Progress Notes (Signed)
Betty Dudley was evaluated through a synchronous (real-time) audio-video encounter. The patient (or guardian if applicable) is aware that this is a billable service, which includes applicable co-pays. This Virtual Visit was conducted with patient's (and/or legal guardian's) consent. The visit was conducted pursuant to the emergency declaration under the [employer] and the [employer], [employer] waiver authority and the [employer] and [employer] Act.  Patient identification was verified, and a caregiver was present when appropriate.     Betty Dudley (DOB:  1960/12/07) is an established patient, here for evaluation of the following: No chief complaint on file.       HPI:   See note    Past Medical History:  Past Medical History:   Diagnosis Date    Abdominal pain     Asthma     Atelectasis     Colitis     Congenital pectus excavatum     Depression     Fatty liver     High cholesterol     Osteoarthritis     Pelvic pain in female     Sleep apnea     Vaginal atrophy     Venous insufficiency        Past Surgical History:  Past Surgical History:   Procedure Laterality Date    FOOT SURGERY  1978    Cyst remove right foot    LIPOMA RESECTION      TONSILLECTOMY  1980    TUBAL LIGATION         Allergies:   Allergies   Allergen Reactions    Penicillins Anaphylaxis    Diclofenac Diarrhea       Medication History:  Current Outpatient Medications   Medication Sig Dispense Refill    Estradiol (VAGIFEM) 10 MCG TABS vaginal tablet Place 1 tablet vaginally Twice a Week 24 tablet 3    estradiol (ESTRACE) 0.1 MG/GM vaginal cream APPLY 2 GRAMS VAGINALLY ONE TIME DAILY FOR 2 WEEKS THEN USE 1/2 TO 1 GRAM VAGINALLY TWICE WEEKLY THEREAFTER 42.5 g 0    acetaminophen (TYLENOL) 500 MG tablet Take 1 tablet by mouth as needed      celecoxib (CELEBREX) 200 MG capsule Take 1 tablet by mouth daily with food. 30 capsule 2    Azelastine HCl 137 MCG/SPRAY SOLN       EPINEPHrine (EPIPEN) 0.3 MG/0.3ML  SOAJ injection       fluticasone (FLONASE) 50 MCG/ACT nasal spray 1 spray by Each Nostril route daily 32 g 0    Cetirizine HCl (ZYRTEC ALLERGY) 10 MG CAPS 1 tablet Orally Once a day for 30 day(s)      diphenhydrAMINE HCl (BENADRYL ALLERGY PO) PRN      albuterol sulfate HFA (PROVENTIL;VENTOLIN;PROAIR) 108 (90 Base) MCG/ACT inhaler 1 puff as needed Inhalation every 4 hrs      budesonide-formoterol (SYMBICORT) 160-4.5 MCG/ACT AERO 2 puffs Inhalation Twice a day       No current facility-administered medications for this visit.       Social History:  Social History     Socioeconomic History    Marital status: Married     Spouse name: Not on file    Number of children: Not on file    Years of education: Not on file    Highest education level: Not on file   Occupational History    Not on file   Tobacco Use    Smoking status: Former     Packs/day: 1.00  Years: 20.00     Additional pack years: 0.00     Total pack years: 20.00     Types: Cigarettes     Quit date: 10/05/2006     Years since quitting: 15.9    Smokeless tobacco: Never    Tobacco comments:     Quit >15 years ago.   Vaping Use    Vaping Use: Never used   Substance and Sexual Activity    Alcohol use: Yes     Alcohol/week: 2.0 standard drinks of alcohol     Types: 1 Glasses of wine, 1 Cans of beer per week     Comment: A couple of times a week/1-2 drinks    Drug use: Never    Sexual activity: Not Currently   Other Topics Concern    Not on file   Social History Narrative    Not on file     Social Determinants of Health     Financial Resource Strain: Not on file   Food Insecurity: Not on file   Transportation Needs: Not on file   Physical Activity: Not on file   Stress: Not on file   Social Connections: Not on file   Intimate Partner Violence: Not on file   Housing Stability: Not on file       Family History:  Family History   Problem Relation Age of Onset    Alzheimer's Disease Mother     Dementia Father        General Examination:  GENERAL APPEARANCE: alert, well  hydrated, in no distress .   HEAD: normocephalic, atraumatic.   EYES: no lid lag, stare or proptosis.   NEUROLOGIC: alert and oriented.   PSYCH: alert, oriented, cognitive function intact, good eye contact, cooperative with exam.      Diagnosis Orders   1. Vaginal atrophy        2. Screening mammogram for breast cancer  MAM TOMO DIGITAL SCREEN BILATERAL           ASSESSMENT/PLAN:        09/03/2022 virtual visit via MyChart I am at the [facility] office and the patient is at home.  Betty Dudley is a 61 year old postmenopausal patient  - Medication refill, I had previously had her on Estrace cream due to symptomatic vaginal atrophy looks like she did not come back in for a follow-up.  She does find it helps but it is very messy and she often forgets I will try changing her to Vagifem  - Grade 1/2 uterine prolapse/grade 2 rectocele: Expectantly managing  Health maintenance: She is overdue for mammogram so order was placed  - Follow-up: Will plan for an annual in February and see how she is doing with her Vagifem    10/13/2021 problem visit, Betty Dudley is a 61 year old menopausal patient, of note I do not see a gravity status on her, she went to the urgent care about a week ago with a vaginal bulge that she noted when she is on the toilet.  It quickly went away and she has no significant pain but is worried about what could be.  Her bowel movements are daily and not hard but she does require some mild splinting.  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  I will initiate Estrace cream since she also has significant atrophy and complains of dryness and bring her back in 3 months to further assess.  I recommend in the meantime some healthy weight loss.  She had a normal colonoscopy last 3 years.  Not sexually active with her husband but does masturbate.  I will see need right now for any surgical intervention and we will plan to expectantly manage unless the symptoms are to significantly bother her denies  bleeding.          01/14/21 new patient annual, this is a 61 year old menopausal patient  without complaints and here for preventative visit. She denies any  bleeding. She is rarely sexually active with her husband about twice a  year. She has some moderate atrophy noted on exam and I  recommend over-the-counter Vagisil as needed for any irritation.  She's up with her mammogram. Up-to-date with colon screening. SBE  recommended monthly and Pap smear performed.

## 2022-09-23 ENCOUNTER — Encounter
Admit: 2022-09-23 | Discharge: 2022-09-23 | Payer: BLUE CROSS/BLUE SHIELD | Attending: Physical Medicine & Rehabilitation | Primary: Family Medicine

## 2022-09-23 DIAGNOSIS — M542 Cervicalgia: Secondary | ICD-10-CM

## 2022-09-23 NOTE — Progress Notes (Signed)
9[name] (DOB:  [birth year]) is a 61 y.o. female,Established patient, here for evaluation of the following chief complaint(s):  Neck Pain (8 Week follow Up)        SUBJECTIVE/OBJECTIVE:  HPI  (OV     09/23/2022):  Patient returns for follow up today.     Any MEDICATION REFILLS needed today?  N/A    Any new imaging studies?  No new imaging.    Any significant changes in your symptoms since last visit with Dr. Trixie Dredge?  No changes.    Any side effects to your medications that Dr. Trixie Dredge prescribes? N/A    Any recent injections/procedures(who, where)?  No.    Any recent Physical Therapy(and where)? Only had 4 sessions of PT for her neck but insurance ran out.    Any other recent treatments that you have engaged in during the past 90 days(ie. Massage therapy, other medications(ie.OTC medications), chiropractic, massage therapy,etc): None.    Is your current level of pain recently stable?  Yes.    What is your pain level today in the office?  3-4, just no ROM    Any radiating pain or numbness/tingling into the upper or lower extremities? Neck pain sometimes radiating into the shoulders.      Functional improvement with medication(yes/no)? N/A    Any changes in overall health? No.    Most recent UDS? N/A    Pain Contract(yes/no/NA): N/A    Review of Systems   Constitutional: Negative.  Negative for chills and fever.   Respiratory:  Positive for shortness of breath.         Asthma   Gastrointestinal: Negative.    Genitourinary: Negative.    Musculoskeletal:  Positive for neck pain. Negative for back pain and gait problem.   Neurological:  Positive for numbness. Negative for seizures and weakness.        Numbness in the left leg, big toe.       BP (!) 149/88   Pulse 75   Temp 97.7 F (36.5 C)   Ht 1.676 m (5\' 6" )   Wt 116.2 kg (256 lb 3.2 oz)   LMP  (LMP Unknown)   SpO2 95%   BMI 41.35 kg/m    Physical Exam  See recent exam and XR findings on last note.           ASSESSMENT/PLAN:  1. Chronic neck pain  2.  Cervical spondylosis  3. Decreased ROM of neck  4. Bilateral shoulder pain, unspecified chronicity       Patient has chronic recurrent typically mild axial neck pain with a primary complaint of diminished cervical range of motion to the left into the right.    X-ray cervical spine reports and films are reviewed.  Findings are notable for prominent multilevel spondylosis/DDD C4 down to C7.    Patient is also being treated for bilateral shoulder pain.  She had recent bilateral shoulder injections with orthopedics which was helpful.  She also had an injection for right hip bursitis which was helpful.      Patient was receiving physical therapy at [facility] for the shoulder pain and neck pain.     [facility] !!    We discussed doing about 6 weeks of physical therapy 2-3 times per week now beginning AFTER the New Year.  *She reports she recently "ran out of PT with the insurance company."  She only had about 4 visits of the PT recently.     If patient not  doing substantially better after 6 weeks of physical therapy we will plan to move ahead with MRI cervical spine.    Discussed potential etiologies for symptoms.     Potential treatment interventions could include medications, physical therapy, injections, and surgical consultation if warranted.  Patient makes it clear that she is not interested whatsoever in surgery at this time.  Her pain is mild and intermittent.      Follow up in 10 weeks [id number]      Return in about 10 weeks (around 12/02/2022).    OV3: >20 minutes,  and/or was compliant with coding and billing requirements of some combination of the following: review of history, multiple diagnoses evaluated/addressed, examination, review of SCRIPTS, medication management, significant acute on chronic exacerbation of symptoms, review of other provider notes, review of labs, discussion of diagnosis(s) and treatment options, discussion of spinal injection procedure, referral for spinal injection procedure, review of  imaging report and films independently, coordination of care, and documentation.     I have seen and examined the patient independently.  I reviewed all laboratory and imaging studies that are relevant.  I have reviewed and made any appropriate changes to the HPI.    Electronically signed by Renata Caprice, DO on 09/23/2022 at 9:38 AM         J. Annice Needy, DO  Board Certified Physical Medicine and Rehabilitation  Board Certified Pain Medicine  Spine/Musculoskeletal Fellowship Trained

## 2022-09-29 ENCOUNTER — Encounter

## 2022-09-29 ENCOUNTER — Ambulatory Visit
Admit: 2022-09-29 | Discharge: 2022-09-29 | Payer: BLUE CROSS/BLUE SHIELD | Attending: Family Medicine | Primary: Family Medicine

## 2022-09-29 DIAGNOSIS — R7303 Prediabetes: Secondary | ICD-10-CM

## 2022-09-29 MED ORDER — CELECOXIB 200 MG PO CAPS
200 MG | ORAL_CAPSULE | ORAL | 2 refills | Status: AC
Start: 2022-09-29 — End: 2022-12-02

## 2022-09-29 NOTE — Progress Notes (Signed)
CHIEF COMPLAINT:  Chief Complaint   Patient presents with    Follow-up     C/o right knee and right shoulder pain.        HISTORY OF PRESENT ILLNESS:  Betty Dudley is a 61 y.o. female  who presents in follow-up.   Knee, Shoulder pain, chronic neck pain- notes that she had a weird day of aches and pains all over her body on 09/24/22 but gone now. She has been having some pain in her R shoulder and R knee. She is thinking she tweeked something. She sees Dr Trixie Dredge for her neck. She is going to see her Ortho Orlie Pollen PA- upcoming appt with him. She is using celebrex as needed. Feels her legs are swollen from standing yesterday and sitting with them down this AM.   Prediabetes- needs repeat labs from [facility].   Borderline HPL- needs repeat labs, monitoring diet.   Obesity- she wants to lose weight, she feels her pains and everything would get better if she could move better (her mobility is worsening) and lose weight. She feels her follow through is not good.   5. OSA on CPAP- wants to see Dr Franky Macho about her CPAP machine. She is thinking she needs to see him for the CPAP. She feels her sleep is very messed up.       PHQ:      09/29/2022     1:14 PM   PHQ-9    Little interest or pleasure in doing things 1   Feeling down, depressed, or hopeless 1   Trouble falling or staying asleep, or sleeping too much 2   Feeling tired or having little energy 2   Poor appetite or overeating 3   Feeling bad about yourself - or that you are a failure or have let yourself or your family down 1   Trouble concentrating on things, such as reading the newspaper or watching television 0   Moving or speaking so slowly that other people could have noticed. Or the opposite - being so fidgety or restless that you have been moving around a lot more than usual 0   Thoughts that you would be better off dead, or of hurting yourself in some way 0   PHQ-2 Score 2   PHQ-9 Total Score 10   If you checked off any problems, how difficult have these  problems made it for you to do your work, take care of things at home, or get along with other people? 1       CURRENT MEDICATION LIST:    Current Outpatient Medications   Medication Sig Dispense Refill    celecoxib (CELEBREX) 200 MG capsule Take 1 tablet by mouth daily with food. 30 capsule 2    Estradiol (VAGIFEM) 10 MCG TABS vaginal tablet Place 1 tablet vaginally Twice a Week 24 tablet 3    acetaminophen (TYLENOL) 500 MG tablet Take 1 tablet by mouth as needed      Azelastine HCl 137 MCG/SPRAY SOLN       EPINEPHrine (EPIPEN) 0.3 MG/0.3ML SOAJ injection       fluticasone (FLONASE) 50 MCG/ACT nasal spray 1 spray by Each Nostril route daily 32 g 0    Cetirizine HCl (ZYRTEC ALLERGY) 10 MG CAPS 1 tablet Orally Once a day for 30 day(s)      diphenhydrAMINE HCl (BENADRYL ALLERGY PO) PRN      albuterol sulfate HFA (PROVENTIL;VENTOLIN;PROAIR) 108 (90 Base) MCG/ACT inhaler 1 puff as needed Inhalation every 4  hrs      budesonide-formoterol (SYMBICORT) 160-4.5 MCG/ACT AERO 2 puffs Inhalation Twice a day       No current facility-administered medications for this visit.        ALLERGIES:    Allergies   Allergen Reactions    Penicillins Anaphylaxis    Diclofenac Diarrhea        HISTORY:  Past Medical History:   Diagnosis Date    Abdominal pain     Asthma     Atelectasis     Colitis     Congenital pectus excavatum     Depression     Fatty liver     High cholesterol     Osteoarthritis     Pelvic pain in female     Sleep apnea     Vaginal atrophy     Venous insufficiency       Past Surgical History:   Procedure Laterality Date    FOOT SURGERY  1978    Cyst remove right foot    LIPOMA RESECTION      TONSILLECTOMY  1980    TUBAL LIGATION        Social History     Socioeconomic History    Marital status: Married     Spouse name: Not on file    Number of children: Not on file    Years of education: Not on file    Highest education level: Not on file   Occupational History    Not on file   Tobacco Use    Smoking status: Former      Current packs/day: 0.00     Average packs/day: 1 pack/day for 20.0 years (20.0 ttl pk-yrs)     Types: Cigarettes     Start date: 10/05/1986     Quit date: 10/05/2006     Years since quitting: 16.0    Smokeless tobacco: Never    Tobacco comments:     Quit >15 years ago.   Vaping Use    Vaping Use: Never used   Substance and Sexual Activity    Alcohol use: Yes     Alcohol/week: 2.0 standard drinks of alcohol     Types: 1 Glasses of wine, 1 Cans of beer per week     Comment: A couple of times a week/1-2 drinks    Drug use: Never    Sexual activity: Not Currently   Other Topics Concern    Not on file   Social History Narrative    Not on file     Social Determinants of Health     Financial Resource Strain: Not on file   Food Insecurity: Not on file   Transportation Needs: Not on file   Physical Activity: Not on file   Stress: Not on file   Social Connections: Not on file   Intimate Partner Violence: Not on file   Housing Stability: Not on file      Family History   Problem Relation Age of Onset    Alzheimer's Disease Mother     Dementia Father         Review of Systems   Constitutional: Negative.    HENT: Negative.     Eyes: Negative.    Respiratory:  Positive for shortness of breath.    Cardiovascular: Negative.    Gastrointestinal: Negative.    Endocrine: Negative.    Genitourinary: Negative.    Musculoskeletal:  Positive for arthralgias.   Skin: Negative.    Allergic/Immunologic: Negative.  Neurological: Negative.    Hematological: Negative.    Psychiatric/Behavioral: Negative.          PHYSICAL EXAM:  Vital Signs -   Visit Vitals  BP 126/82   Pulse 81   Temp 98.2 F (36.8 C) (Oral)   Resp 18   Wt 116.3 kg (256 lb 4.8 oz)   SpO2 96%   BMI 41.37 kg/m        Physical Exam  Vitals and nursing note reviewed.   Constitutional:       General: She is not in acute distress.     Appearance: Normal appearance. She is obese.   HENT:      Head: Normocephalic and atraumatic.      Mouth/Throat:      Mouth: Mucous membranes are  moist.      Pharynx: Oropharynx is clear. No oropharyngeal exudate or posterior oropharyngeal erythema.   Eyes:      Extraocular Movements: Extraocular movements intact.      Conjunctiva/sclera: Conjunctivae normal.      Pupils: Pupils are equal, round, and reactive to light.   Neck:      Vascular: No carotid bruit.   Cardiovascular:      Rate and Rhythm: Normal rate and regular rhythm.      Heart sounds: Normal heart sounds. No murmur heard.  Pulmonary:      Effort: Pulmonary effort is normal. No respiratory distress.      Breath sounds: Normal breath sounds.   Abdominal:      General: Bowel sounds are normal. There is no distension.      Palpations: Abdomen is soft.      Tenderness: There is no abdominal tenderness.   Musculoskeletal:         General: No swelling or tenderness. Normal range of motion.      Cervical back: Normal range of motion and neck supple.      Right lower leg: Edema (trace) present.      Left lower leg: Edema (trace) present.   Lymphadenopathy:      Cervical: No cervical adenopathy.   Skin:     General: Skin is warm and dry.      Findings: No erythema or rash.   Neurological:      General: No focal deficit present.      Mental Status: She is alert and oriented to person, place, and time. Mental status is at baseline.   Psychiatric:         Mood and Affect: Mood normal.         Behavior: Behavior normal.         Thought Content: Thought content normal.         Judgment: Judgment normal.          LABS  No results found for this visit on 09/29/22.  Office Visit on 04/08/2022   Component Date Value Ref Range Status    SARS-CoV-2 04/08/2022 Not Detected  Not Detected Final    Comment: Test performed at [facility] [address], [place]  [address]  The cobas SARS-CoV-2 & Influenza A/B Nucleic acid test for use on the cobas  Liat System (cobas SARS-CoV-2 & Influenza A/B) is an automated multiplex  real-time RT-PCR assay intended for the simultaneous rapid in vitro  qualitative  detection and differentiation of SARS-CoV-2, influenza A, and  influenza B virus RNA in healthcare provider-collected nasopharyngeal and  nasal swabs, from individuals suspected of respiratory viral infection  consistent with  COVID-19 by their healthcare provider.  Cobas SARS-CoV-2 & Influenza A/B is intended for use in the simultaneous rapid  in vitro detection and differentiation of SARS-CoV-2, influenza A virus, and  influenza B virus nucleic acids in clinical specimens and is not intended to  detect influenza C virus. SARS-CoV-2, influenza A and influenza B viral RNA is  generally detectable in respiratory specimens during the acute phase of  infection. Positive results ar                           e indicative of active infection but do not rule  out bacterial infection or co-infection with other pathogens not detected by  the test. Clinical correlation with patient history and other diagnostic  information is necessary to determine patient infection status. The agent  detected may not be the definite cause of disease.  Negative results do not preclude SARS-CoV-2, influenza A and/or influenza B  infection and should not be used as the sole basis for diagnosis, treatment or  other patient management decisions. Negative results must be combined with  clinical observations, patient history, and/or epidemiological information.  The cobas SARS-CoV-2 & Influenza A/B Nucleic acid test is only for use under  the Food and Drug Administration's Emergency Use Authorization.      INFLUENZA A 04/08/2022 Not Detected  Not Detected Final    Comment: Test performed at [facility] [address] [place]  [address]      INFLUENZA B 04/08/2022 Not Detected  Not Detected Final    Comment: Test performed at [facility] [address], [place]  [address]      Sodium, POC 04/08/2022 139  MMOL/L Final    Potassium, POC 04/08/2022 4.1  MMOL/L Final    Chloride, POC 04/08/2022 104  MMOL/L Final    CO2,  POC 04/08/2022 25  mEq/L Final    Calcium, ionized, POC 04/08/2022 1.17  mmol/L Final    Glucose, POC 04/08/2022 116  MG/DL Final    BUN, POC 93/23/5573 9  MG/DL Final    Creatinine, POC 04/08/2022 0.6  MG/DL Final    Anion Gap, POC 04/08/2022 16  mmol/l Final    Hematocrit, POC 04/08/2022 40  % Final    Hemoglobin, POC 04/08/2022 13.6  G/DL Final    Hematocrit, POC 04/08/2022 40.5  % Final    Hemoglobin, POC 04/08/2022 13.4  G/DL Final    WBC, POC 22/11/5425 7.8  K/uL Final    Platelet Count, POC 04/08/2022 260  K/UL Final    MCV 04/08/2022 94.6   Final    Neutrophil % 04/08/2022 72.8  % Final    Lymphocyte % 04/08/2022 23.2  % Final    Other WBC %, POC 04/08/2022 4.0  % Final    Neutrophil Absolute, POC 04/08/2022 5.7   Final    Lymphocye Absolute, POC 04/08/2022 1.8   Final    Other WBC Absolute, POC 04/08/2022 0.3   Final       IMPRESSION/PLAN    1. Prediabetes  Cont dietary control and monitor closely.   - CBC+Hematology Review ([facility] DEFAULT); Future  - CMP12+8AC ([facility] DEFAULT); Future  - Hemoglobin A1c with eAG ([facility] DEFAULT); Future  - [facility] - Minter, Sarah A DO, Obesity Medicine, [address]    2. Borderline hyperlipidemia  Cont dietary control and monitor closely  - CBC+Hematology Review ([facility] DEFAULT); Future  - CMP12+8AC ([facility] DEFAULT); Future  - Lipid Panel  W/ Chol/HDL Ratio ([facility] DEFAULT); Future  - [facility] - Minter, Leary Roca DO, Obesity Medicine, [address]    3. Obesity (BMI 30-39.9)  Complicates all aspects of care, referral to obesity medicine for further eval/mgmt  - [facility] - Minter, Sarah A DO, Obesity Medicine, [address]    4. Acute pain of right knee  Cont supportive care, going back to Ortho as she has seen before    5. Acute pain of right shoulder  Cont supportive measures, going back to Ortho for followup and eval    6. Trochanteric bursitis of right hip  Cont at lowest effective dosage, seeing Ortho  - celecoxib (CELEBREX) 200 MG capsule; Take 1  tablet by mouth daily with food.  Dispense: 30 capsule; Refill: 2    7. OSA (obstructive sleep apnea)  Will refer back to Dr Ambrose Mantle to assess OSA with CPAP  - Pennie Rushing, MD - Pulmonology     Follow up and Dispositions:  Return for 3-4 months with DR Z.       Gean Birchwood, MD

## 2022-10-01 LAB — CMP12+8AC
ALT: 36 IU/L — ABNORMAL HIGH (ref 0–32)
AST: 25 IU/L (ref 0–40)
Albumin/Globulin Ratio: 2.4 — ABNORMAL HIGH (ref 1.2–2.2)
Albumin: 4.5 g/dL (ref 3.9–4.9)
Alkaline Phosphatase: 62 IU/L (ref 44–121)
BUN/Creatinine Ratio: 21 (ref 12–28)
BUN: 17 mg/dL (ref 8–27)
Calcium: 9 mg/dL (ref 8.7–10.3)
Chloride: 106 mmol/L (ref 96–106)
Cholesterol: 169 mg/dL (ref 100–199)
Creatinine: 0.81 mg/dL (ref 0.57–1.00)
Est, Glom Filt Rate: 83 mL/min/{1.73_m2} (ref >59)
GGT: 33 IU/L (ref 0–60)
Globulin, Total: 1.9 g/dL (ref 1.5–4.5)
Glucose: 103 mg/dL — ABNORMAL HIGH (ref 70–99)
Iron: 102 ug/dL (ref 27–139)
LD: 159 IU/L (ref 119–226)
Phosphorus: 4.5 mg/dL — ABNORMAL HIGH (ref 3.0–4.3)
Potassium: 4.3 mmol/L (ref 3.5–5.2)
Sodium: 143 mmol/L (ref 134–144)
Total Bilirubin: 0.5 mg/dL (ref 0.0–1.2)
Total Protein: 6.4 g/dL (ref 6.0–8.5)
Triglycerides: 142 mg/dL (ref 0–149)
Uric Acid: 6.4 mg/dL (ref 3.0–7.2)

## 2022-10-01 LAB — LIPID PANEL W/ CHOL/HDL RATIO
Chol/HDL Ratio: 3.4 ratio (ref 0.0–4.4)
HDL: 49 mg/dL (ref >39)
LDL Calculated: 95 mg/dL (ref 0–99)
VLDL Cholesterol Calculated: 25 mg/dL (ref 5–40)

## 2022-10-01 LAB — HEMOGLOBIN A1C W/EAG
Estimated Avg Glucose: 123 mg/dL
Hemoglobin A1C: 5.9 % — ABNORMAL HIGH (ref 4.8–5.6)

## 2022-10-06 LAB — CBC+HEMATOLOGY REVIEW
Basophils %: 1 %
Basophils Absolute: 0.1 10*3/uL (ref 0.0–0.2)
Eosinophils Absolute: 0.4 10*3/uL (ref 0.0–0.4)
Eosinophils: 6 %
Hematocrit: 39 % (ref 34.0–46.6)
Hemoglobin: 13.2 g/dL (ref 11.1–15.9)
Lymphocytes Absolute: 2.9 10*3/uL (ref 0.7–3.1)
Lymphocytes: 43 %
MCH: 31.1 pg (ref 26.6–33.0)
MCHC: 33.8 g/dL (ref 31.5–35.7)
MCV: 92 fL (ref 79–97)
Monocytes Absolute: 0.5 10*3/uL (ref 0.1–0.9)
Monocytes: 7 %
Neutrophils Absolute: 2.9 10*3/uL (ref 1.4–7.0)
Neutrophils: 43 %
Platelet Comment: ADEQUATE
RBC Comment: NORMAL
RBC: 4.24 x10E6/uL (ref 3.77–5.28)
RDW: 11.5 % — ABNORMAL LOW (ref 11.7–15.4)
WBC: 6.7 10*3/uL (ref 3.4–10.8)

## 2022-10-12 ENCOUNTER — Encounter: Attending: Medical | Primary: Family Medicine

## 2022-10-12 DIAGNOSIS — M25561 Pain in right knee: Secondary | ICD-10-CM

## 2022-10-16 ENCOUNTER — Ambulatory Visit: Payer: BLUE CROSS/BLUE SHIELD | Primary: Family Medicine

## 2022-10-19 ENCOUNTER — Encounter: Payer: BLUE CROSS/BLUE SHIELD | Attending: Medical | Primary: Family Medicine

## 2022-10-19 DIAGNOSIS — M25561 Pain in right knee: Secondary | ICD-10-CM

## 2022-10-26 ENCOUNTER — Encounter: Payer: BLUE CROSS/BLUE SHIELD | Attending: Medical | Primary: Family Medicine

## 2022-10-26 ENCOUNTER — Encounter: Payer: BLUE CROSS/BLUE SHIELD | Attending: Obstetrics & Gynecology | Primary: Family Medicine

## 2022-10-26 DIAGNOSIS — M25562 Pain in left knee: Secondary | ICD-10-CM

## 2022-10-29 ENCOUNTER — Ambulatory Visit: Payer: BLUE CROSS/BLUE SHIELD | Primary: Family Medicine

## 2022-11-12 ENCOUNTER — Encounter: Payer: BLUE CROSS/BLUE SHIELD | Attending: Medical | Primary: Family Medicine

## 2022-11-12 DIAGNOSIS — M25561 Pain in right knee: Secondary | ICD-10-CM

## 2022-11-17 ENCOUNTER — Inpatient Hospital Stay: Admit: 2022-11-17 | Payer: BLUE CROSS/BLUE SHIELD | Attending: Obstetrics & Gynecology | Primary: Family Medicine

## 2022-11-17 DIAGNOSIS — Z1231 Encounter for screening mammogram for malignant neoplasm of breast: Secondary | ICD-10-CM

## 2022-11-24 ENCOUNTER — Encounter: Payer: BLUE CROSS/BLUE SHIELD | Attending: Medical | Primary: Family Medicine

## 2022-11-24 DIAGNOSIS — M25562 Pain in left knee: Secondary | ICD-10-CM

## 2022-11-25 NOTE — Telephone Encounter (Signed)
LVM for patient to call and reschedule appointment to be with Dr. Louie Boston due to [facility] being out of office. Left main line for patient to call and reschedule.

## 2022-11-26 NOTE — Telephone Encounter (Signed)
Patient called stated she was prescribed a steroid called Dexamethasone 6 mg when she went to the [facility] and she has been taking it and it helps with her pain and is also taking the celecoxib (CELEBREX) 200 MG capsule and would like to know if she could get a Rx for the Dexamethasone 6 mg .    Please call and advise advise

## 2022-12-01 ENCOUNTER — Ambulatory Visit
Admit: 2022-12-01 | Discharge: 2022-12-01 | Payer: BLUE CROSS/BLUE SHIELD | Attending: Physician Assistant | Primary: Family Medicine

## 2022-12-01 DIAGNOSIS — J22 Unspecified acute lower respiratory infection: Secondary | ICD-10-CM

## 2022-12-01 MED ORDER — BENZONATATE 100 MG PO CAPS
100 | ORAL_CAPSULE | Freq: Three times a day (TID) | ORAL | 0 refills | Status: AC | PRN
Start: 2022-12-01 — End: 2022-12-11

## 2022-12-01 MED ORDER — AZITHROMYCIN 250 MG PO TABS
250 | ORAL_TABLET | ORAL | 0 refills | Status: AC
Start: 2022-12-01 — End: 2022-12-11

## 2022-12-01 NOTE — Progress Notes (Signed)
Betty Dudley (DOB:  1960-10-25) is a 62 y.o. female,Established patient, here for evaluation of the following chief complaint(s):  Cough (Dx with flu A a week ago/Cough won't resolve)      ASSESSMENT/PLAN:  Visit Diagnoses and Associated Orders       Lower respiratory tract infection    -  Primary    benzonatate (TESSALON) 100 MG capsule [[id number]]      azithromycin (ZITHROMAX) 250 MG tablet [[id number]]                     SUBJECTIVE/OBJECTIVE:  62 YO FEMALE, HX OF ASTHMA, PRESENTS TO [facility] WITH C/O PERSISTENT RESPIRATORY SYMPTOMS >7 DAYS.   HAD FLU A LAST WEEK AND FEELS LIKE COUGH IS GETTING WORSE. DESPITE HOME TREATMENT (TYLENOL), CONTINUES TO HAVE COUGH, CONGESTION, DIFFICULTY SLEEPING ALL NIGHT WITH COUGH SPELLS.  REPORTS NO OTALGIA. NO NECK PAIN OR STIFFNESS.  DENIES RASHES. DENIES OCULAR COMPLAINT.  DENIES CP/SOB AT REST.   REPORTS NO ABDOMINAL PAIN, NO CHANGE TO BOWEL/BLADDER FX/FREQ OR RASHES.  NO SICK CONTACT, NO TRAVEL HISTORY,  HX OF ASTHMA/USING INHALER EVERY 6H AND THIS HELPS, NO HX OF RECURRENT BRONCHITIS OR PNA.        Cough      HPI:   62 y.o. female presents with symptoms of          Vitals:    12/01/22 1035   BP: 118/74   Pulse: 78   Resp: 18   Temp: 98.6 F (37 C)   SpO2: 96%   Weight: 115.7 kg (255 lb)   Height: 1.676 m (5' 6"$ )         Physical Exam  Vitals reviewed.   Constitutional:       General: She is not in acute distress.     Appearance: Normal appearance. She is not ill-appearing or toxic-appearing.   HENT:      Head: Normocephalic and atraumatic.      Right Ear: Tympanic membrane, ear canal and external ear normal. There is no impacted cerumen. Tympanic membrane is not perforated, erythematous, retracted or bulging.      Left Ear: Tympanic membrane, ear canal and external ear normal. There is no impacted cerumen. Tympanic membrane is not perforated, erythematous, retracted or bulging.      Nose: Congestion and rhinorrhea present.      Mouth/Throat:      Mouth: Mucous membranes are  moist.      Pharynx: Oropharynx is clear. No oropharyngeal exudate or posterior oropharyngeal erythema.      Tonsils: No tonsillar exudate or tonsillar abscesses. 0 on the right. 0 on the left.   Eyes:      General: Lids are normal. Lids are everted, no foreign bodies appreciated. No scleral icterus.        Right eye: No discharge or hordeolum.         Left eye: No discharge or hordeolum.      Extraocular Movements: Extraocular movements intact.      Conjunctiva/sclera: Conjunctivae normal.      Right eye: Right conjunctiva is not injected.      Left eye: Left conjunctiva is not injected.      Pupils: Pupils are equal, round, and reactive to light.   Neck:      Trachea: Trachea normal.      Meningeal: Brudzinski's sign and Kernig's sign absent.   Cardiovascular:      Rate and Rhythm: Normal rate and regular rhythm.  Pulmonary:      Effort: Pulmonary effort is normal. No respiratory distress.      Breath sounds: Normal breath sounds. No decreased breath sounds, wheezing, rhonchi or rales.   Abdominal:      General: Bowel sounds are normal.      Palpations: Abdomen is soft.   Musculoskeletal:      Cervical back: Normal range of motion and neck supple. No rigidity or tenderness.   Lymphadenopathy:      Cervical: No cervical adenopathy.   Skin:     General: Skin is warm and dry.      Capillary Refill: Capillary refill takes less than 2 seconds.   Neurological:      General: No focal deficit present.      Mental Status: She is alert and oriented to person, place, and time.      GCS: GCS eye subscore is 4. GCS verbal subscore is 5. GCS motor subscore is 6.       POST VIRAL COUGH VS LRTI.   REASSURING EXAM TODAY.  RESTART YOUR MUCINEX  CONTINUE Q6H USE OF INHALER  RECOMMEND CONTINUE YOUR OTC SINUS/ALLERGY MEDICATION  PUSH FLUIDS  [place] HANDS  CONSIDER SALINE NASAL SPRAY, LIBERAL USE  REVIEWED E/SE/INDICATION FOR PRESCRIBED TREATMENT TODAY.  RTC IF NEW/WORSE SYMPTOMS       An electronic signature was used to authenticate  this note.    --Imelda Pillow, PA-C

## 2022-12-01 NOTE — Patient Instructions (Signed)
POST VIRAL COUGH VS LRTI.   REASSURING EXAM TODAY.  RESTART YOUR MUCINEX  CONTINUE Q6H USE OF INHALER  RECOMMEND CONTINUE YOUR OTC SINUS/ALLERGY MEDICATION  PUSH FLUIDS  [place] HANDS  CONSIDER SALINE NASAL SPRAY, LIBERAL USE  REVIEWED E/SE/INDICATION FOR PRESCRIBED TREATMENT TODAY.  RTC IF NEW/WORSE SYMPTOMS

## 2022-12-02 ENCOUNTER — Ambulatory Visit
Admit: 2022-12-02 | Discharge: 2022-12-02 | Payer: BLUE CROSS/BLUE SHIELD | Attending: Physical Medicine & Rehabilitation | Primary: Family Medicine

## 2022-12-02 DIAGNOSIS — G8929 Other chronic pain: Secondary | ICD-10-CM

## 2022-12-02 MED ORDER — DICLOFENAC SODIUM 1 % EX GEL
1 % | Freq: Four times a day (QID) | CUTANEOUS | 0 refills | Status: AC | PRN
Start: 2022-12-02 — End: 2023-01-27

## 2022-12-02 NOTE — Progress Notes (Signed)
Betty Dudley (DOB:  08-22-1961) is a 62 y.o. female,Established patient, here for evaluation of the following chief complaint(s):  Neck Pain (10 Week Follow Up)          SUBJECTIVE/OBJECTIVE:  HPI  (OV     12/02/2022):  Patient returns for follow up today.  Patient comes in today and reports she recently had the flu and was having a lot of coughing.  She was given Decadron 6 mg to take for 5 days.  She noted that her neck pain and knee pain associated with arthritis felt significantly better while on the steroids and continues to feel better at this time.  Current pain is pretty minimal.  She will be seeing Dr. Louie Boston regarding her bilateral knee pain and osteoarthritis.  She has recently taken Celebrex for her neck pain and arthritis in the knees and is not noticing a lot of benefit from the Celebrex.  She was interested in an alternate NSAID.    Any MEDICATION REFILLS needed today? N/A    Any new imaging studies?  No new imaging.    Any significant changes in your symptoms since last visit with Dr. Arletta Bale? She was given a steroid for her asthma and cough and it really helped her painful joints. (Dexamethisone). The neck is still very stiff, little ROM.    Any side effects to your medications that Dr. Arletta Bale prescribes? N/A    Any recent injections/procedures(who, where)? No.    Any recent Physical Therapy(and where)? Has not had PT. She says no one has called her.    Any other recent treatments that you have engaged in during the past 90 days(ie. Massage therapy, other medications(ie.OTC medications), chiropractic, massage therapy,etc): None.    Is your current level of pain recently stable?  Yes.    What is your pain level today in the office? 2    Any radiating pain or numbness/tingling into the upper or lower extremities?  Left leg and foot are numb.    Functional improvement with medication(yes/no)? N/A    Any changes in overall health? She had been sick with Flu and asthma.        Review of Systems    Constitutional:  Negative for chills and fever.        Recent flu last week diagnosed on Tuesday.  Residual cough symptoms currently.  No current fevers or chills.   Respiratory:          Asthma   Musculoskeletal:  Positive for arthralgias and neck pain.        Bilateral knee pain   Neurological:         Numbness in the left leg and foot.       BP 139/83   Pulse 73   Temp 98.2 F (36.8 C)   Ht 1.676 m (5' 6"$ )   Wt 115.8 kg (255 lb 4.8 oz)   LMP  (LMP Unknown)   SpO2 96%   BMI 41.21 kg/m    Physical Exam  Musculoskeletal:      Cervical back: Decreased range of motion.      Right knee: Crepitus present. Tenderness present over the medial joint line.      Left knee: Crepitus present. Tenderness present over the medial joint line.      Comments: Mild crepitus       07/09/22    Adalberto Ill, PA  I ordered and interpreted x-rays of cervical spine.  They show evidence of arthritic changes  and degenerative disc disease.  Significant disc space narrowing at C4-5 and C5-6.  No evidence of instability    CMP:    Lab Results   Component Value Date/Time    NA 143 09/30/2022 07:24 AM    K 4.3 09/30/2022 07:24 AM    CL 106 09/30/2022 07:24 AM    CO2 23 01/09/2021 12:13 PM    BUN 17 09/30/2022 07:24 AM    CREATININE 0.81 09/30/2022 07:24 AM    GFRAA 110 01/09/2021 12:13 PM    AGRATIO 2.4 09/30/2022 07:24 AM    LABGLOM 83 09/30/2022 07:24 AM    GLUCOSE 103 09/30/2022 07:24 AM    PROT 6.4 09/30/2022 07:24 AM    LABALBU 4.5 09/30/2022 07:24 AM    CALCIUM 9.0 09/30/2022 07:24 AM    BILITOT 0.5 09/30/2022 07:24 AM    ALKPHOS 62 09/30/2022 07:24 AM    AST 25 09/30/2022 07:24 AM    ALT 36 09/30/2022 07:24 AM       ASSESSMENT/PLAN:  1. Chronic neck pain  2. Cervical spondylosis  3. Decreased ROM of neck  4. Bilateral shoulder pain, unspecified chronicity  5. Bilateral primary osteoarthritis of knee  -     diclofenac sodium (VOLTAREN) 1 % GEL; Apply 4 g topically 4 times daily as needed for Pain Ninety day supply. Apply as  directed. No more than two body parts. Stop if any GI upset., Topical, 4 TIMES DAILY PRN Starting Wed 12/02/2022, Until Fri 01/01/2023 at 2359, For 30 days, Disp-300 g, R-0, Normal  6. Chronic pain of both knees  -     diclofenac sodium (VOLTAREN) 1 % GEL; Apply 4 g topically 4 times daily as needed for Pain Ninety day supply. Apply as directed. No more than two body parts. Stop if any GI upset., Topical, 4 TIMES DAILY PRN Starting Wed 12/02/2022, Until Fri 01/01/2023 at 2359, For 30 days, Disp-300 g, R-0, Normal    Patient comes in today and reports recent flu symptoms but feeling much better now but still with residual coughing symptoms.  No current fevers or chills.    She was given Decadron for 5 days and reported her neck pain and bilateral knee pain felt much better.    Discussed her medications at length.  She reports Celebrex has not really been doing much for her knee pain and arthritis nor her neck pain.    We discussed having her discontinue the Celebrex and try the topical Voltaren gel as a alternative treatment for her knee arthritis.  Patient reports her neck is actually doing pretty good right now although she does have diminished range of motion.    Patient is not interested in pursuing physical therapy right now for her neck.  We can revisit that if she develops recurrent neck pain.    I had a long discussion about NSAID medications.  Also discussed steroids.    Patient was provided (*or currently takes) an anti-inflammatory medication (NSAID).  I discussed medication including dosing, possible side effects, and precautions.  Discussed potential GI, renal, and cardiac side effects of NSAIDs.  Patient understands to stop the medication with any GI upset.  Patient also understands only one NSAID at a time as prescribed.      Patient will be seeing orthopedics-Dr. [name] for further evaluation of her knee osteoarthritis.    Patient can follow-up with me as needed.        Return if symptoms worsen or fail to  improve.  OV4: >30 minutes,  and/or was compliant with coding and billing requirements of some combination of the following: review of history, multiple diagnoses evaluated/addressed, examination, review of SCRIPTS, medication management, significant acute on chronic exacerbation of symptoms, review of other provider notes, review of labs, discussion of diagnosis(s) and treatment options, discussion of spinal injection procedure, referral for spinal injection procedure, review of imaging report and films independently, coordination of care, and documentation.     I have seen and examined the patient independently.  I reviewed all laboratory and imaging studies that are relevant.  I have reviewed and made any appropriate changes to the HPI.    Electronically signed by Renata Caprice, DO on 12/02/2022 at 9:58 AM     J. Annice Needy, DO  Board Certified Physical Medicine and Rehabilitation  Board Certified Pain Medicine  Spine/Musculoskeletal Fellowship Trained

## 2022-12-02 NOTE — Progress Notes (Unsigned)
Betty Dudley (DOB:  [birth year]) is a 62 y.o. female,Established patient, here for evaluation of the following chief complaint(s):  No chief complaint on file.          SUBJECTIVE/OBJECTIVE:  HPI  (OV     11/19/2022):  Patient returns for follow up today     Any MEDICATION REFILLS needed today? N/A    Any new imaging studies?  No new imaging.    Any significant changes in your symptoms since last visit with Dr. Trixie Dredge? She was given a steroid for her asthma and cough and it really helped her painful joints. (Dexamethisone). The neck is still very stiff, little ROM.    Any side effects to your medications that Dr. Trixie Dredge prescribes? N/A    Any recent injections/procedures(who, where)? No.    Any recent Physical Therapy(and where)? Has not had PT. She says no one has called her.    Any other recent treatments that you have engaged in during the past 90 days(ie. Massage therapy, other medications(ie.OTC medications), chiropractic, massage therapy,etc): None.    Is your current level of pain recently stable?  Yes.    What is your pain level today in the office? 2    Any radiating pain or numbness/tingling into the upper or lower extremities?  Left leg and foot are numb.    Functional improvement with medication(yes/no)? N/A    Any changes in overall health? She had been sick with Flu and asthma.    Most recent UDS?  N/A    Pain Contract(yes/no/NA): N/A    Review of Systems   Constitutional: Negative.  Negative for chills and fever.   Respiratory:          Asthma   Musculoskeletal:  Positive for neck pain. Negative for back pain and gait problem.   Neurological:  Positive for numbness. Negative for seizures and weakness.        Numbness in the left leg and foot.       LMP  (LMP Unknown)    Physical Exam    {AVWU:98119}    ASSESSMENT/PLAN:  {There are no diagnoses linked to this encounter. (Refresh or delete this SmartLink)}    No follow-ups on file.    ***    Erick Alley, DO  Board Certified Physical Medicine and  Rehabilitation  Board Certified Pain Medicine  Spine/Musculoskeletal Fellowship Trained

## 2022-12-04 ENCOUNTER — Ambulatory Visit
Admit: 2022-12-04 | Discharge: 2022-12-04 | Payer: BLUE CROSS/BLUE SHIELD | Attending: Obstetrics & Gynecology | Primary: Family Medicine

## 2022-12-04 DIAGNOSIS — Z01419 Encounter for gynecological examination (general) (routine) without abnormal findings: Secondary | ICD-10-CM

## 2022-12-04 MED ORDER — ESTRADIOL 10 MCG VA TABS
10 MCG | ORAL_TABLET | VAGINAL | 3 refills | Status: DC
Start: 2022-12-04 — End: 2024-01-06

## 2022-12-04 NOTE — Progress Notes (Signed)
CC: Patient presents today for Annual Exam    HPI: see assessment and plan    OB History       Gravida   2    Para   2    Term   2    Preterm        AB        Living   2         SAB        IAB        Ectopic        Molar        Multiple        Live Births   2                GYN History     GYN History      Last Pap result:  Diagnosis:   Date Value Ref Range Status   01/14/2021 Comment  Final     Comment:     NEGATIVE FOR INTRAEPITHELIAL LESION OR MALIGNANCY.  REACTIVE CELLULAR CHANGES NOTED.       Specimen adequacy:   Date Value Ref Range Status   01/14/2021 Comment  Final     Comment:     Satisfactory for evaluation. Endocervical and/or squamous metaplastic  cells (endocervical component) are present.       Methodology:   Date Value Ref Range Status   01/14/2021 Comment  Final     Comment:     This liquid based ThinPrep(R) pap test was screened with the  use of an image guided system.           Past Medical History:  Past Medical History:   Diagnosis Date    Abdominal pain     Asthma     Atelectasis     Colitis     Congenital pectus excavatum     Depression     Fatty liver     High cholesterol     Osteoarthritis     Pelvic pain in female     Sleep apnea     Vaginal atrophy     Venous insufficiency        Past Surgical History:  Past Surgical History:   Procedure Laterality Date    FOOT SURGERY  1978    Cyst remove right foot    LIPOMA RESECTION      TONSILLECTOMY  1980    TUBAL LIGATION         Allergies:   Allergies   Allergen Reactions    Penicillins Anaphylaxis    Diclofenac Diarrhea       Medication History:  Current Outpatient Medications   Medication Sig Dispense Refill    [START ON 12/07/2022] Estradiol (VAGIFEM) 10 MCG TABS vaginal tablet Place 1 tablet vaginally Twice a Week 24 tablet 3    diclofenac sodium (VOLTAREN) 1 % GEL Apply 4 g topically 4 times daily as needed for Pain Ninety day supply. Apply as directed. No more than two body parts. Stop if any GI upset. 300 g 0    benzonatate (TESSALON) 100 MG  capsule Take 1 capsule by mouth 3 times daily as needed for Cough 30 capsule 0    azithromycin (ZITHROMAX) 250 MG tablet '500mg'$  on day 1 followed by '250mg'$  on days 2 - 5 6 tablet 0    acetaminophen (TYLENOL) 500 MG tablet Take 1 tablet by mouth as needed      Azelastine HCl 137 MCG/SPRAY  SOLN       EPINEPHrine (EPIPEN) 0.3 MG/0.3ML SOAJ injection       fluticasone (FLONASE) 50 MCG/ACT nasal spray 1 spray by Each Nostril route daily 32 g 0    Cetirizine HCl (ZYRTEC ALLERGY) 10 MG CAPS 1 tablet Orally Once a day for 30 day(s)      diphenhydrAMINE HCl (BENADRYL ALLERGY PO) PRN      albuterol sulfate HFA (PROVENTIL;VENTOLIN;PROAIR) 108 (90 Base) MCG/ACT inhaler 1 puff as needed Inhalation every 4 hrs      budesonide-formoterol (SYMBICORT) 160-4.5 MCG/ACT AERO 2 puffs Inhalation Twice a day       No current facility-administered medications for this visit.       Social History:  Social History     Socioeconomic History    Marital status: Married     Spouse name: Not on file    Number of children: Not on file    Years of education: Not on file    Highest education level: Not on file   Occupational History    Not on file   Tobacco Use    Smoking status: Former     Current packs/day: 0.00     Average packs/day: 1 pack/day for 20.0 years (20.0 ttl pk-yrs)     Types: Cigarettes     Start date: 10/05/1986     Quit date: 10/05/2006     Years since quitting: 16.1    Smokeless tobacco: Never    Tobacco comments:     Quit >15 years ago.   Vaping Use    Vaping Use: Never used   Substance and Sexual Activity    Alcohol use: Yes     Alcohol/week: 2.0 standard drinks of alcohol     Types: 1 Glasses of wine, 1 Cans of beer per week     Comment: This is a guess i do not drink every day but occasionally i    Drug use: Never    Sexual activity: Not Currently   Other Topics Concern    Not on file   Social History Narrative    Not on file     Social Determinants of Health     Financial Resource Strain: Not on file   Food Insecurity: Not on file    Transportation Needs: Not on file   Physical Activity: Not on file   Stress: Not on file   Social Connections: Not on file   Intimate Partner Violence: Not on file   Housing Stability: Not on file       Family History:  Family History   Problem Relation Age of Onset    Alzheimer's Disease Mother     Dementia Father     Breast Cancer Paternal Aunt        Objective:   BP 124/70   Pulse 91   Ht 1.676 m (5' 5.98")   Wt 114.6 kg (252 lb 9.6 oz)   LMP  (LMP Unknown)   BMI 40.79 kg/m   Urine:'@LASTPROCAMB'$ (POC81001;POC81003;poc81002;poc81000;POC81001F;POC81003E)@  Hemoglobin: No components found for: "SPHB"   The patient appears well, alert, oriented x 3, in no distress.    PHYSICAL EXAM:    General Examination:  GENERAL APPEARANCE: alert, well hydrated, in no distress.   HEAD: normocephalic, atraumatic.   EYES: no lid lag, stare or proptosis, extraocular movement full and smooth.   NECK: thyroid normal.   RESPIRATORY: unlabored.   ABDOMEN: normal, soft, nontender, nondistended.   SKIN: normal, no suspicious lesions.   EXTREMITIES:  no edema, normal, full range of motion.   NEUROLOGIC: alert and oriented, gait normal.       BREAST EXAM:    General Examination:  GENERAL APPEARANCE: pleasant, well nourished, well developed, in no acute distress.   NECK: no thyromegaly, neck supple.   LUNGS: good air movement.   BREASTS: no dimpling, no discharge, no masses palpable bilaterally, nontender.   ABDOMEN: soft, nontender, nondistended.            PELVIC EXAM:   Examination:   Gynecological:  EXTERNAL GENITALIA: normal, introitus normal.   URETHRA: meatus normal.   BLADDER: normal.   VAGINA: healthy pink mucosa without any lesions.   CERVIX: no cervical movement tenderness, no lesions.   UTERUS: normal mobility, nontender.   ADNEXA: no mass, nontender.   ANUS/PERINEUM: normal.                Assessment/Plan:       ICD-10-CM    1. Women's annual routine gynecological examination  Z01.419       2. 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  Mahagany is a 62 year old postmenopausal patient  - Medication refill, I had previously had her on Estrace cream due to symptomatic vaginal atrophy looks like she did not come back in for a follow-up.  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  It quickly went away and she has no significant pain but is worried about what could be.  Her bowel movements are daily and not hard but she does require some mild splinting.  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  I will initiate Estrace cream since she also has significant atrophy and complains of dryness and bring her back in 3 months to further assess.  I recommend in the meantime some healthy weight loss.  She had a normal colonoscopy last 3 years.  Not sexually active with her husband but does  masturbate.  I will see need right now for any surgical intervention and we will plan to expectantly manage unless the symptoms are to significantly bother her denies bleeding.  01/14/21 new patient annual, this is a 62 year old menopausal patient  without complaints and here for preventative visit. She denies any  bleeding. She is rarely sexually active with her husband about twice a  year. She has some moderate atrophy noted on exam and I  recommend over-the-counter Vagisil as needed for any irritation.  She's up with her mammogram. Up-to-date with colon screening. SBE  recommended monthly and Pap smear performed.

## 2022-12-08 ENCOUNTER — Encounter: Payer: BLUE CROSS/BLUE SHIELD | Attending: Medical | Primary: Family Medicine

## 2022-12-08 DIAGNOSIS — M25561 Pain in right knee: Secondary | ICD-10-CM

## 2022-12-15 ENCOUNTER — Encounter: Attending: Orthopaedic Surgery | Primary: Family Medicine

## 2022-12-17 ENCOUNTER — Ambulatory Visit: Admit: 2022-12-17 | Discharge: 2022-12-23 | Payer: BLUE CROSS/BLUE SHIELD | Primary: Family Medicine

## 2022-12-17 ENCOUNTER — Encounter
Admit: 2022-12-17 | Discharge: 2022-12-17 | Payer: BLUE CROSS/BLUE SHIELD | Attending: Orthopaedic Surgery | Primary: Family Medicine

## 2022-12-17 DIAGNOSIS — M170 Bilateral primary osteoarthritis of knee: Secondary | ICD-10-CM

## 2022-12-17 DIAGNOSIS — M25562 Pain in left knee: Secondary | ICD-10-CM

## 2022-12-17 MED ORDER — METHYLPREDNISOLONE ACETATE 40 MG/ML IJ SUSP
40 | Freq: Once | INTRAMUSCULAR | Status: AC
Start: 2022-12-17 — End: 2022-12-17
  Administered 2022-12-17: 14:00:00 80 mg via INTRA_ARTICULAR

## 2022-12-17 MED ORDER — LIDOCAINE HCL 1 % IJ SOLN
1 | Freq: Once | INTRAMUSCULAR | Status: AC
Start: 2022-12-17 — End: 2022-12-17
  Administered 2022-12-17: 14:00:00 6 mL via INTRA_ARTICULAR

## 2022-12-17 NOTE — Progress Notes (Signed)
Betty Dudley. Betty Boots, MD  Orthopedic Surgeon  Shoulder and Elbow  Sports Medicine  Phone [phone]  Fax [phone]    Betty Dudley   [address] [place]  [address]  [place]  [address]    [place]  12/17/2022       Name: Betty Dudley  Age:  62 y.o.  Sex:  female  DOB:  09-26-1961     MRN:  [id number]      Chief Complaint:     Knee Pain (B/L)       History of Present Illness:     The patient is a 62 year old woman who presents today with bilateral knee pain.  She has had pain for about 4 to 5 years.  Is been gradual in nature.  She saw Dr. Vikki Dudley back in 2020 and gave her injections for that.  She does not remember how well they worked.  She did however have IV steroids recently to treat a lung condition and she said her knees felt fantastic.  Then when she was off of the steroids her knee pain returned.  She is also seeing Betty Dudley in the recent past for bilateral shoulders and hips.  He is given her shoulder and hip injections but nothing to the knees.  She rates her knee pain a 7 out of 10 in intensity bilaterally.  Past Medical History:         Diagnosis Date    Abdominal pain     Asthma     Atelectasis     Colitis     Congenital pectus excavatum     Depression     Fatty liver     High cholesterol     Osteoarthritis     Pelvic pain in female     Sleep apnea     Vaginal atrophy     Venous insufficiency       Past Surgical History:         Procedure Laterality Date    FOOT SURGERY  1978    Cyst remove right foot    LIPOMA RESECTION      TONSILLECTOMY  1980    TUBAL LIGATION        Family History:     Family History   Problem Relation Age of Onset    Alzheimer's Disease Mother     Dementia Father     Breast Cancer Paternal Aunt       Social History:     Social History     Occupational History    Not on file   Tobacco Use    Smoking status: Former     Current packs/day: 0.00     Average packs/day: 1 pack/day for 20.0 years (20.0 ttl pk-yrs)     Types: Cigarettes      Start date: 10/05/1986     Quit date: 10/05/2006     Years since quitting: 16.2    Smokeless tobacco: Never    Tobacco comments:     Quit >15 years ago.   Vaping Use    Vaping Use: Never used   Substance and Sexual Activity    Alcohol use: Yes     Alcohol/week: 2.0 standard drinks of alcohol     Types: 1 Glasses of wine, 1 Cans of beer per week     Comment: This is a guess i do not drink every day but occasionally i    Drug  use: Never    Sexual activity: Not Currently      Allergies:     Allergies   Allergen Reactions    Penicillins Anaphylaxis    Diclofenac Diarrhea      Medications:     Outpatient Medications Marked as Taking for the 12/17/22 encounter (Office Visit) with Betty Rutherford, MD   Medication Sig Dispense Refill    Estradiol (VAGIFEM) 10 MCG TABS vaginal tablet Place 1 tablet vaginally Twice a Week 24 tablet 3    diclofenac sodium (VOLTAREN) 1 % GEL Apply 4 g topically 4 times daily as needed for Pain Ninety day supply. Apply as directed. No more than two body parts. Stop if any GI upset. 300 g 0    acetaminophen (TYLENOL) 500 MG tablet Take 1 tablet by mouth as needed      Azelastine HCl 137 MCG/SPRAY SOLN       EPINEPHrine (EPIPEN) 0.3 MG/0.3ML SOAJ injection       fluticasone (FLONASE) 50 MCG/ACT nasal spray 1 spray by Each Nostril route daily 32 g 0    Cetirizine HCl (ZYRTEC ALLERGY) 10 MG CAPS 1 tablet Orally Once a day for 30 day(s)      diphenhydrAMINE HCl (BENADRYL ALLERGY PO) PRN      albuterol sulfate HFA (PROVENTIL;VENTOLIN;PROAIR) 108 (90 Base) MCG/ACT inhaler 1 puff as needed Inhalation every 4 hrs      budesonide-formoterol (SYMBICORT) 160-4.5 MCG/ACT AERO 2 puffs Inhalation Twice a day        Physical Examination:     The patient is well-developed well-nourished and in no acute distress.  She appears her stated age and has appropriate affect.  Examination of the bilateral lower extremity shows normal alignment no gross deformity.  She has full extension of both knees.  She has 5 out of 5  strength of knee flexion and extension.  She has tenderness palpation over the medial joint lines bilaterally.  Imaging:     XR KNEE RIGHT (MIN 4 VIEWS)  AP, lateral, tunnel, sunrise views of the right knee are obtained today in   the office.  It shows significantly narrowed medial compartment with   medial femoral condyle osteophyte.  There is some sclerosis of the medial   tibial plateau.  No fractures or atypical calcifications.  XR KNEE LEFT (MIN 4 VIEWS)  AP, lateral, tunnel, sunrise views of the left knee are obtained today in   the office.  It shows nearly bone-on-bone articulation of the medial   compartment with flattening of the medial femoral condyle and sclerosis of   the tibial plateau.       Assessment & Plan:   Betty Dudley was seen today for knee pain.    Diagnoses and all orders for this visit:    Primary osteoarthritis of both knees  -     lidocaine 1 % injection 6 mL  -     methylPREDNISolone acetate (DEPO-MEDROL) injection 80 mg  -     lidocaine 1 % injection 6 mL  -     methylPREDNISolone acetate (DEPO-MEDROL) injection 80 mg    Pain in both knees, unspecified chronicity  -     XR KNEE LEFT (MIN 4 VIEWS)  -     XR KNEE RIGHT (MIN 4 VIEWS)          Bilateral knee osteoarthritis.  She is educated on the nature of diagnosis.  It has been maybe 4 years since she has had steroid injections as far as  I can tell.  I went through the office notes of Betty Dudley and Betty Dudley.  The patient was educated about the nature of the problem and her treatment options.  She was in agreement with steroid injections today.  Those were done and well-tolerated.  She will follow-up with me if and when needed.  Procedure(s):       As a result of the evaluation and management of the patient's office visit today, the decision was made to treat with intra-articular steroid.  The patient elected to have this performed today for their convenience.  Therefore under sterile technique 6 cc 1% lidocaine and 2 cc 40 mg/cc  Depo-Medrol was injected each knee joint.  This was well-tolerated.       Disclaimer: This note was dictated using speech recognition. Minor errors in transcription may be present. Please contact creator for further clarification.

## 2022-12-23 ENCOUNTER — Ambulatory Visit
Admit: 2022-12-23 | Discharge: 2022-12-23 | Payer: BLUE CROSS/BLUE SHIELD | Attending: Internal Medicine | Primary: Family Medicine

## 2022-12-23 DIAGNOSIS — R635 Abnormal weight gain: Secondary | ICD-10-CM

## 2022-12-23 NOTE — Patient Instructions (Addendum)
-   We'll begin patient on a higher protein, lower carbohydrate meal plan. Based on the measured caloric expenditure and current weight, the meal plan will include approximately 1g protein/kg body weight. Patient encouraged to consume 3 meals a day with limited snacking. Aim to eat every 3 to 4 hours. Recommend   1400-1500  calories based on  based on   []  Resting metabolic rate test   [x]  Calculated resting metabolic rate  Protein goal is  115  grams of protein per day. Patient may have one serving of starch per meal with serving size of  1/2 to 3/4  cup. Patient may have up to 2 fruit servings a day.   Patient may have unlimited nonstarchy vegetables per day.  - Patient is encouraged to drink 80-100 ounces of water per day with a minimum of 64 ounces per day.   - Patient is asked to complete a food journal: Pre logging/written   - Labs today:none Further treatment based on lab results.  - Encouraged patient to increase activity level. Ultimate goal will be one hour at least 5 days a week. Have reviewed specific activity for patient:  walking 10-20 minutes 3x per week. May increased as tolerated.   - Plan to initiate Zepbound (weekly self-administered) medication approved for weight, but will also help pre-diabetes (same medication as Mounjaro) and fatty liver. Would be for long term and need to monitor lean mass. Most common side effects -  nausea, reflux, change in bowels, or abdominal pain.   - Dietitian consult at next available appointment if insurance coverage available   - MD/APP follow up in 6 to 8 weeks

## 2022-12-23 NOTE — Progress Notes (Signed)
Betty Dudley, (DOB:  Aug 24, 1961) is a 62 y.o. female presents 12/23/22 for initial evaluation for weight gain with resultant obesity. Patient is referred by her primary care physician, Dr. Loletta Specter. Past medical history is notable for OSA, hepatic steatosis, pre-diabetes, asthma, venous insufficiency, bilateral osteoarthritis of knees, and adult ADD - neuropsych.       Subjective     HPI    Why are you here today? She just keeps gaining mor and more weight. She injured her left leg two years ago, gained about 60 lbs.     How does your weight affect your life and health?: Knees are bad and is further compounded by excess weight.     Weight history: During her first pregnancy, told she did not gain enough weight. Took 2 years to lose weight, came off faster with second daughter. Never been back to pre-pregnancy weight.     Goals: Wants to be able to walk again.     Lowest weight: 110 lbs (age 66)  Highest weight: 263 lbs      Prior dietary attempts:    Atkins   Fasting    Mediterranean diet   [place]        Cabbage soup  hCG meal plan   Noom    VLCD     [place]   Doylene Bode   Nutrisystem   Weight Watchers      DASH   Keto    Optavia    Zone   E2M   Medifast    Protein shakes    Other - TRF 12-8 pm (could lose 20 lbs - felt frustrated with plateau)    Prior dietary medications:   Phentermine      Results:   Phentermine/topiramate (Qsymia)   Results:   Diethylpropion      Results:   Phendimetrazine      Results:   Xenical/Alli       Results:     Saxenda       Results:   ZOXWRU       Results:   Contrave       Results:  Wellbutrin - made her cry without provocation  Never trialed Vvyanse, trialed Ritalin and Adderall     Bariatric surgery interest: No: limited info, not interseted    Triggers to weight gain:   Anger   Illness    Physical hunger    Stress      Anxiety   Injury    Portion size    Travel   Boredom    Insomnia     Pregnancy                 Other: Pain - currently controlled    Eating out   Lack of motivation    Reward- previously for undiagnosed ADD    Fast food   Night shift work   Social       Food Cravings:     Chocolate    High Fat   Salty - BBQ sandwich    Savory   Starches   Sugar - icre     Current level of stress: Manageable - she can enjoy a crisis to fix it, being bored is stressful     Relationship with food:  Loves food. Denies overt preoccupation. Eats fast as she grew up as 1 of 6 children.     History of eating disorder:    None    Anorexia    Binging  []  Bulimia  []  Orthorexia    Current eating disorder signs and symptoms:    Do you eat late at night after having dinner? Sometimes   Do you wake up in the middle of the night to have a snack or meal? No    Usual bed time: 9:30-10 pm, may have a nap earlier in the day   Usual time falling asleep: Depends on day  Wake time: 6 am  Quality of sleep:    Do you feel rested in the morning? A little better, less headaches   OSA? Yes   CPAP use? Yes, nasal pillow   Restless Leg Syndrome?   Sleep medication(s)? None    Other? Breathing out of mouth can be bothersome and nocturia 1x per night       Food preferences or intolerances: Enjoys salads but prep can be difficult.  Likes cold items due to sinus issue. Not a bread eater, likes pasta.     Food environment:  Who lives in the house? Husband  Who does the grocery shopping? Shared - never with a list  Who does the cooking? Husband more, but takes turns   Where do you eat your food?   [x]  Desk   []  In bed   []  In front of the TV   []  Kitchen   []  While driving   [x]  Table   [x]  Car      24-hour food intake: No prior food journal    Breakfast: 6:30a - piece of cinnamon roll        9:30a breakfast cheese, eggs, jalapenos, sausage/   Lunch: 1:30-2p fried shrimp, fries, and cole slaw   Dinner: spaghetti with sausage and mushrooms   Snacks: chocolate ice cream    Fluid Intake:         Water 32-64       Juice none       Soda 32-64 - averages 1 per day     []  Sugar free       [x]  Regular    Coffee 0-16 - more whole milk than coffee like latte   Tea      none    []  Sweetened    []  Unsweetened       Alcohol Alcohol intake per week (in standard drinks)      []  N/A      []  0-2 drinks     [x]  3-4 drinks per week       []  3-5 drinks      []  6-10 drinks      []  10+ drinks            Physical activities:Previously walked 20 minutes and 20 minute stretch and 20 minutes stretch most days before injury   Frequency: None  Type:   Length:       If no, what prevents you from activity? Time and motivation as well     Biggest challenge to weight loss  knees    Current Outpatient Medications on File Prior to Visit   Medication Sig Dispense Refill    ibuprofen (ADVIL;MOTRIN) 200 MG tablet Take 1 tablet by mouth every 6 hours as needed for Pain Patient does not know dose, she takes 2 tablets a day.      Estradiol (VAGIFEM) 10 MCG TABS vaginal tablet Place 1 tablet vaginally Twice a Week 24 tablet 3    diclofenac sodium (VOLTAREN) 1 % GEL Apply 4 g topically 4 times daily as  needed for Pain Ninety day supply. Apply as directed. No more than two body parts. Stop if any GI upset. 300 g 0    Azelastine HCl 137 MCG/SPRAY SOLN       EPINEPHrine (EPIPEN) 0.3 MG/0.3ML SOAJ injection       fluticasone (FLONASE) 50 MCG/ACT nasal spray 1 spray by Each Nostril route daily 32 g 0    Cetirizine HCl (ZYRTEC ALLERGY) 10 MG CAPS 1 tablet Orally Once a day for 30 day(s)      diphenhydrAMINE HCl (BENADRYL ALLERGY PO) PRN      albuterol sulfate HFA (PROVENTIL;VENTOLIN;PROAIR) 108 (90 Base) MCG/ACT inhaler 1 puff as needed Inhalation every 4 hrs      budesonide-formoterol (SYMBICORT) 160-4.5 MCG/ACT AERO 2 puffs Inhalation Twice a day       No current facility-administered medications on file prior to visit.       Allergies   Allergen Reactions    Penicillins Anaphylaxis    Diclofenac Diarrhea        Past Medical History:    Diagnosis Date    Abdominal pain     Asthma     Atelectasis     Colitis     Congenital pectus excavatum     Depression     Fatty liver     High cholesterol     Osteoarthritis     Pelvic pain in female     Sleep apnea     Vaginal atrophy     Venous insufficiency       Past Surgical History:   Procedure Laterality Date    FOOT SURGERY  1978    Cyst remove right foot    LIPOMA RESECTION      TONSILLECTOMY  1980    TUBAL LIGATION          Family History   Problem Relation Age of Onset    Alzheimer's Disease Mother     Dementia Father     Breast Cancer Paternal Aunt    Parents heavy, but less than children.   Older sister - not too much weight difficulty  Two brothers,with weight, one is similar to patient.   Daughters- generally healthy weight    Social History     Tobacco Use    Smoking status: Former     Current packs/day: 0.00     Average packs/day: 1 pack/day for 20.0 years (20.0 ttl pk-yrs)     Types: Cigarettes     Start date: 10/05/1986     Quit date: 10/05/2006     Years since quitting: 16.2    Smokeless tobacco: Never    Tobacco comments:     Quit >15 years ago.   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     Review of Systems   Constitutional:  Positive for fatigue. Negative for appetite change, chills, diaphoresis and fever.   HENT:  Negative for dental problem and trouble swallowing.         Some lip changes makes drinking difficult   Eyes:  Negative for visual disturbance.  Respiratory:  Negative for chest tightness and shortness of breath.    Cardiovascular:  Negative for chest pain, palpitations and leg swelling.   Gastrointestinal:  Negative for abdominal distention, abdominal pain, constipation, diarrhea and nausea.    Endocrine: Positive for heat intolerance. Negative for cold intolerance, polydipsia, polyphagia and polyuria.   Musculoskeletal:  Positive for arthralgias. Negative for back pain.   Skin:  Negative for color change.   Neurological:  Negative for headaches (improved).   Psychiatric/Behavioral:  Positive for decreased concentration, dysphoric mood and sleep disturbance. The patient is not nervous/anxious.                   Objective     BP 109/70   Pulse 75   Temp 98.1 F (36.7 C) (Oral)   Ht 1.651 m (5\' 5" )   Wt 115.2 kg (254 lb 0.7 oz)   LMP  (LMP Unknown)   SpO2 95%   BMI 42.28 kg/m         12/23/2022     9:16 AM 12/23/2022     9:00 AM 12/17/2022     9:27 AM 12/04/2022    10:37 AM 12/02/2022     9:16 AM 12/01/2022    10:35 AM 09/29/2022     1:25 PM   Weight Metrics   Weight 254 lb 0.7 oz  250 lb 252 lb 9.6 oz 255 lb 4.8 oz 255 lb 256 lb 4.8 oz   Waist Measure Inches  53 in        BMI (Calculated) 42.4 kg/m2  40.4 kg/m2 40.9 kg/m2 41.3 kg/m2 41.2 kg/m2 0 kg/m2         12/23/2022     9:00 AM   Medical Scale Data   Initial Height 5\' 5"    Initial Weight 254 lb 0.7 oz   Weight 254 lb 0.7 oz   Total Weight Change 0   Initial Body Fat % 49.34   Body Fat % 49.34   Total Body Fat% Change 0   Fat Mass (lbs) 125.68   Fat Free Mass (lbs) 129.06   Water (lbs) 97.79   Waist (Inches) 53   Visceral Adipose Tissue (Index) 6.75   Protein Mass 31.27       Physical Exam  General: No acute distress.  HEENT: PERR, no scleral icterus   Neck: No cervical adenopathy. No appreciable thyromegaly, masses, or nodules.   Heart: Regular rate and rhythm, no murmurs.   Lungs:  Clear to auscultation bilaterally.  Respirations unlabored.  Extremities: No edema.  Distal pulses are intact.  Skin: No rashes. No hyperpigmentation.   Psychological: Mood congruent affect. Goal oriented thought process.            Lab Results   Component Value Date    LABA1C 5.9 (H) 09/30/2022     No results found for: "GLUF"  Lab Results   Component Value Date/Time     WBC 6.7 09/30/2022 07:24 AM    RBC 4.24 09/30/2022 07:24 AM    HGB 13.2 09/30/2022 07:24 AM    HCT 39.0 09/30/2022 07:24 AM    PLT 308 01/09/2021 12:13 PM    MCV 92 09/30/2022 07:24 AM    MCV 94.6 04/08/2022 01:48 PM    MCH 31.1 09/30/2022 07:24 AM    MCHC 33.8 09/30/2022 07:24 AM    RDW 11.5 09/30/2022 07:24 AM    LYMPHOPCT 32.3 01/09/2021 12:13 PM    MONOPCT 7.1 01/09/2021 12:13 PM    BASOPCT  1 09/30/2022 07:24 AM    BASOPCT 0.8 01/09/2021 12:13 PM    MONOSABS 0.5 09/30/2022 07:24 AM    LYMPHSABS 2.9 09/30/2022 07:24 AM    EOSABS 0.4 09/30/2022 07:24 AM    BASOSABS 0.1 09/30/2022 07:24 AM     CMP:    Lab Results   Component Value Date/Time    NA 143 09/30/2022 07:24 AM    K 4.3 09/30/2022 07:24 AM    CL 106 09/30/2022 07:24 AM    CO2 23 01/09/2021 12:13 PM    BUN 17 09/30/2022 07:24 AM    CREATININE 0.81 09/30/2022 07:24 AM    GFRAA 110 01/09/2021 12:13 PM    AGRATIO 2.4 09/30/2022 07:24 AM    LABGLOM 83 09/30/2022 07:24 AM    GLUCOSE 103 09/30/2022 07:24 AM    PROT 6.4 09/30/2022 07:24 AM    LABALBU 4.5 09/30/2022 07:24 AM    CALCIUM 9.0 09/30/2022 07:24 AM    BILITOT 0.5 09/30/2022 07:24 AM    ALKPHOS 62 09/30/2022 07:24 AM    AST 25 09/30/2022 07:24 AM    ALT 36 09/30/2022 07:24 AM     HgBA1c:    Lab Results   Component Value Date/Time    LABA1C 5.9 09/30/2022 07:24 AM     TSH:  No results found for: "TSH"   No results found for: "TSH", "TSHREFLEX", "TSHFT4", "TSHELE", "TSH3GEN", "TSHHS"  Lipid Profile:  Lab Results   Component Value Date    CHOL 169 09/30/2022    CHOL 212 (H) 01/09/2021     Lab Results   Component Value Date    TRIG 142 09/30/2022    TRIG 157 (H) 01/09/2021     Lab Results   Component Value Date    HDL 49 09/30/2022    HDL 52 01/09/2021     Lab Results   Component Value Date    LDLCHOLESTEROL 128.6 (H) 01/09/2021    LDLCALC 95 09/30/2022     Lab Results   Component Value Date    VLDL 31.4 01/09/2021     Lab Results   Component Value Date    CHOLHDLRATIO 3.4 09/30/2022    CHOLHDLRATIO 4.1  01/09/2021             ASSESSMENT/PLAN:  1. Weight gain  Multifactorial, primarily driven by sedentary lifestyle in part due to significant leg injury as well as food selection based on convenience which also favors c highly palatable options which are often calorically dense based on food recall.  Other contributing factors include insulin resistance, age, postmenopausal, underlying genetics with family history, and +/- obesogenic medications.     2. BMI 40.0-44.9, adult (HCC)  Consistent with class III obesity    3. Pre-diabetes  Reviewed role of insulin resistance in weight management which can be helped with reduction in carbohydrate intake, increase in protein intake, and subsequent weight loss to help reduce blood sugar    4. Uncomplicated asthma, unspecified asthma severity, unspecified whether persistent  Weight loss can reduce severity and frequency of asthma flares.    5. Hepatic steatosis  Weight loss is essential in management and may benefit from an increase in agonist therapy based on early studies.    6. Bilateral primary osteoarthritis of knee  Weight loss may improve discomfort but also impairs patient's ability to fully engage in structured physical activity.     7. Attention deficit disorder, unspecified hyperactivity presence  There is a correlation between uncontrolled ADHD and difficulty managing weight.  8. OSA  Obstructive sleep apnea (OSA) left untreated can negatively impact weight and food related behaviors as well as reduce metabolism. Weight loss can improve symptoms of OSA.     - We'll begin patient on a higher protein, lower carbohydrate meal plan. Based on the measured caloric expenditure and current weight, the meal plan will include approximately 1g protein/kg body weight. Patient encouraged to consume 3 meals a day with limited snacking. Aim to eat every 3 to 4 hours. Recommend   1400-1500  calories based on  based on   []  Resting metabolic rate test   [x]  Calculated resting  metabolic rate  Protein goal is  115  grams of protein per day. Patient may have one serving of starch per meal with serving size of  1/2 to 3/4  cup. Patient may have up to 2 fruit servings a day.   Patient may have unlimited nonstarchy vegetables per day.  - Patient is encouraged to drink 80-100 ounces of water per day with a minimum of 64 ounces per day.   - Patient is asked to complete a food journal: Pre logging/written   - Labs today:none Further treatment based on lab results.  - Encouraged patient to increase activity level. Ultimate goal will be one hour at least 5 days a week. Have reviewed specific activity for patient:  walking 10-20 minutes 3x per week. May increased as tolerated.   - Plan to initiate Zepbound (weekly self-administered) medication approved for weight, but will also help pre-diabetes (same medication as Mounjaro) and fatty liver. Would be for long term and need to monitor lean mass. Most common side effects -  nausea, reflux, change in bowels, or abdominal pain.   - Dietitian consult at next available appointment if insurance coverage available   - MD/APP follow up in 6 to 8 weeks    PROCEDURE NOTE:  Indirect calorimetry not performed-patient consumed coffee.  Plan to obtain if patient is not meeting 1 to 2 pounds per week weight loss goal.    On this date 12/23/2022 I have spent 60 minutes reviewing previous notes, test results and face to face (52 minutes) with the patient discussing the diagnosis and importance of compliance with the treatment plan as well as documenting on the day of the visit.      An electronic signature was used to authenticate this note.    --Lucianne Lei, DO

## 2023-01-07 ENCOUNTER — Telehealth: Admit: 2023-01-07 | Discharge: 2023-01-07 | Payer: BLUE CROSS/BLUE SHIELD | Primary: Family Medicine

## 2023-01-07 DIAGNOSIS — Z6841 Body Mass Index (BMI) 40.0 and over, adult: Secondary | ICD-10-CM

## 2023-01-07 NOTE — Patient Instructions (Signed)
1. Ensure adequate fluid intake. Aim for 64-80 ounces fluid/day, unless instructed to limit fluid intake by a medical provider.     >Great job with fluid intake!    2. Ensure adequate protein intake. Aim for 115 gm protein/day    >Sample breakdown: breakfast (30-35 gm)/lunch (30-35 gm)/snack (15 gm)/dinner (30-35 gm)  >Try to eat within 2 hours of waking    >Think protein + produce at meal times! Unlimited amount of non starchy veggies + 2 fruit servings    >Lets try to limit red meat to 2-3 portions/week    >Switch from whole milk to Fairlife 2% milk or less    >Protein Snack Ideas:   Veggies with hummus or greek yogurt ranch dip (unflavored greek yogurt + dry ranch seasoning) or cottage cheese blended dips  Celery or apple with peanut butter   Chocolate chia pudding (2% cow's milk or less or Fairlife milk, 1 scoop chocolate protein powder, 1 tbsp chia seeds)  Cottage cheese (Good Culture or low fat) with fruit or tomatoes & cracked black pepper   [place] yogurt (Oikos Triple Zero or Oikos Pro; Ratio Yogurt; Chobani Complete Drinkable Yogurt; Dannon Light and Fit) or Skyr - may add fruit and nuts   At home drinkable yogurt: 5.3 oz greek yogurt + 1/2 tsp lemon juice + 1/3 cup milk of choice + 1-2 squirts Stevia (optional). Blend on high.   Chickpea Poppers: Thoroughly dry canned chickpeas. Spritz with extra-virgin olive oil, season with dried oregano and garlic salt and roast at 400 until crisp.  Can change up seasoning and spices to your preference.    Chomps jerky sticks (venison, beef, [place]) or [employer] + cheese roll ups   Hard boiled eggs: may add spices to change up the flavor    String cheese  "Cheesy" popcorn: 1.5 cup popcorn + 1 tbsp nutritional yeast  [place]  Quest protein chips    3. Start incorporating 1 to 2 approved protein shake(s) to help you reach daily protein goal (see pages 29 and 30 of your handbook for protein supplements that are recommended)    >Premier Protein, Fairlife (Core Power  or Nutrition Plan), Ensure Max Protein, Equate High Performance    >Want at least 25 gm of protein from shakes    4. Practice portion control by using measure tools and tricks seen on page 13 of the handbook   5. Keep a daily food journal of what you eat and drink each day. Bring completed food logs to next office visit. May track using pen and paper or an online tool like Calorie King or MyFitnessPal     >Goal: 1400-1500 calories, 115 gm protein daily per Dr. Chelsea Primus recommendations  >Trial Baritastic app for ease of food logging and free barcode scanner    6. Physical activity: Goal: walk 10-15 minutes a day; strength training as able.     7. Recipe of Month: Benetta Spar Shake  -You need:  Premier Protein Vanilla shake (or vanilla protein shake of your choosing)  1 packet of Sunkist [employer]  -Directions:  Blend ice + vanilla protein shake + 1 orange packet     Follow up with the dietitian on July 3rd at 8:00 am VIRTUAL.  Call or email with any questions or concerns.   Office: [phone]. Email: [email or web address]

## 2023-01-07 NOTE — Progress Notes (Unsigned)
Nutrition Assessment - Medical Weight Management     No chief complaint on file.       Betty Dudley presents today for individual nutrition counseling for medical weight management. Patient reports they present to our program for ***. First started to notice weight gain ***.   Last seen by [facility]: 12/23/2022 (established). Last dietitian visit: ***.     *** works as a ***, ***, mostly ***. Lives at home with her husband. She and husband split the grocery shopping and cooking.  Eats out ***. Struggles with ***.     Unable to review the patient's body composition today due to virtual visit. Patient self reported weight of *** lb.     {HAS/HAS NOT:30012825} been keeping a food log. A 24 hour recall was performed and reviewed. It revealed ***.     Zepbound?    24 Hour Recall           Breakfast: ***           Snack: ***           Lunch: ***           Snack: ***           Dinner: ***           Beverages: ***    I reinforced the importance of realistic goal setting and assuming responsibility of self-care such as tracking food intake, engaging in physical activity, and seeking additional support when needed.      Nutrition Recommendations and Goals:    A copy of the following nutrition recommendations and goals were provided to the patient.    1. Ensure adequate fluid intake. Aim for 64-80 ounces fluid/day, unless instructed to limit fluid intake by a medical provider.   2. Ensure adequate protein intake. Aim for 115 gm protein/day  3. Start/Continue incorporating 1 to 2 approved protein shake(s) to help you reach daily protein goal (see pages 29 and 30 of your handbook for protein supplements that are recommended)  4. Practice portion control by using measure tools and tricks seen on page 13 of the handbook   5. Keep a daily food journal of what you eat and drink each day. Bring completed food logs to next office visit. May track using pen and paper or an online tool like Calorie King or MyFitnessPal     >Goal: 1400-1500  calories, 115 gm protein daily per Dr. Chelsea Primus recommendations    6. Physical activity: ***    Follow up with the dietitian ***  Call or email with any questions or concerns.   Office: [phone]. Email: [email or web address]          Nutrition Diagnosis:    Weight/Clinical: Overweight/Obesity    {No diagnosis found. (Refresh or delete this SmartLink)}     Nutrition Monitoring:    Patient demonstrates a {Good/Fair/Poor:41158} understanding of the diet education and recommendations discussed at today's visit as evidenced by patient verbalization and teach back.  Comprehension is estimated to be: {Desc; low/moderate/high:110033}  Obstacles to good compliance: {Barriers to health:41016}    No data recorded    Last Weight Metrics:      12/23/2022     9:16 AM 12/17/2022     9:27 AM 12/04/2022    10:37 AM 12/02/2022     9:16 AM 12/01/2022    10:35 AM 09/29/2022     1:25 PM 09/23/2022     9:02 AM   Weight Loss Metrics   Height 5\' 5"   5\' 6"  5' 5.984" 5\' 6"  5\' 6"   5\' 6"    Weight - Scale 254 lbs 1 oz 250 lbs 252 lbs 10 oz 255 lbs 5 oz 255 lbs 256 lbs 5 oz 256 lbs 3 oz   BMI (Calculated) 42.4 kg/m2 40.4 kg/m2 40.9 kg/m2 41.3 kg/m2 41.2 kg/m2 0 kg/m2 41.4 kg/m2           12/23/2022     9:00 AM   Medical Scale Data   Initial Height 5\' 5"    Initial Weight 254 lb 0.7 oz   Weight 254 lb 0.7 oz   Total Weight Change 0   Initial Body Fat % 49.34   Body Fat % 49.34   Total Body Fat% Change 0   Fat Mass (lbs) 125.68   Fat Free Mass (lbs) 129.06   Water (lbs) 97.79   Waist (Inches) 53   Visceral Adipose Tissue (Index) 6.75   Protein Mass 31.27        No follow-ups on file.            Start Time: ***         End Time: ***         Total Time Spent: *** Minutes    Betty Dudley was evaluated through a synchronous (real-time) audio-video encounter. The patient (or guardian if applicable) is aware that this is a billable service, which includes applicable co-pays. This Virtual Visit was conducted with patient's (and/or legal guardian's) consent.  Patient identification was verified, and a caregiver was present when appropriate.   The patient was located at [address]  [place] [address]   Provider was located at home: [place], [place] [address]  STOP! Confirm you are appropriately licensed, registered, or certified to deliver care in the state where the patient is located as indicated above. If you are not or unsure, please re-schedule the visit    Linward Headland, MS, RD, LD

## 2023-01-13 ENCOUNTER — Encounter: Payer: BLUE CROSS/BLUE SHIELD | Attending: Family Medicine | Primary: Family Medicine

## 2023-01-27 ENCOUNTER — Ambulatory Visit
Admit: 2023-01-27 | Discharge: 2023-01-27 | Payer: BLUE CROSS/BLUE SHIELD | Attending: Family Medicine | Primary: Family Medicine

## 2023-01-27 ENCOUNTER — Encounter

## 2023-01-27 NOTE — Progress Notes (Signed)
CHIEF COMPLAINT:  Chief Complaint   Patient presents with    Follow-up     No issues         HISTORY OF PRESENT ILLNESS:  Betty Dudley is a 62 y.o. female  who presents in follow-up.   Morbid Obesity- she notes she is keeping a food log. She has lost a bit of weight. She is seeing bariatrics. She is walking for 20 minutes consistently now but hoping her insurance will pay for the nutritionist now.    Prediabetes- stable at 5.9 on last check. Will need to recheck.   Borderline HPL- improved on last check.   OSA, Asthma- Seeing Dr Franky Macho in followup upcoming. She is using her symbicort BID and albuterol prn.    Depression- she notes she feels depressed but wants no help with it. She feels like she deals with it appropriately.       PHQ:      01/27/2023    10:00 AM   PHQ-9    Little interest or pleasure in doing things 2   Feeling down, depressed, or hopeless 2   Trouble falling or staying asleep, or sleeping too much 2   Feeling tired or having little energy 2   Poor appetite or overeating 2   Feeling bad about yourself - or that you are a failure or have let yourself or your family down 2   Trouble concentrating on things, such as reading the newspaper or watching television 1   Moving or speaking so slowly that other people could have noticed. Or the opposite - being so fidgety or restless that you have been moving around a lot more than usual 0   Thoughts that you would be better off dead, or of hurting yourself in some way 0   PHQ-2 Score 4   PHQ-9 Total Score 13       CURRENT MEDICATION LIST:    Current Outpatient Medications   Medication Sig Dispense Refill    ibuprofen (ADVIL;MOTRIN) 200 MG tablet Take 1 tablet by mouth every 6 hours as needed for Pain Patient does not know dose, she takes 2 tablets a day.      Estradiol (VAGIFEM) 10 MCG TABS vaginal tablet Place 1 tablet vaginally Twice a Week 24 tablet 3    diclofenac sodium (VOLTAREN) 1 % GEL Apply 4 g topically 4 times daily as needed for Pain Ninety  day supply. Apply as directed. No more than two body parts. Stop if any GI upset. 300 g 0    Azelastine HCl 137 MCG/SPRAY SOLN       EPINEPHrine (EPIPEN) 0.3 MG/0.3ML SOAJ injection       fluticasone (FLONASE) 50 MCG/ACT nasal spray 1 spray by Each Nostril route daily 32 g 0    Cetirizine HCl (ZYRTEC ALLERGY) 10 MG CAPS 1 tablet Orally Once a day for 30 day(s)      diphenhydrAMINE HCl (BENADRYL ALLERGY PO) PRN      albuterol sulfate HFA (PROVENTIL;VENTOLIN;PROAIR) 108 (90 Base) MCG/ACT inhaler 1 puff as needed Inhalation every 4 hrs      budesonide-formoterol (SYMBICORT) 160-4.5 MCG/ACT AERO 2 puffs Inhalation Twice a day       No current facility-administered medications for this visit.        ALLERGIES:    Allergies   Allergen Reactions    Penicillins Anaphylaxis    Diclofenac Diarrhea        HISTORY:  Past Medical History:   Diagnosis Date  Abdominal pain     Asthma     Atelectasis     Colitis     Congenital pectus excavatum     Depression     Fatty liver     High cholesterol     Osteoarthritis     Pelvic pain in female     Sleep apnea     Vaginal atrophy     Venous insufficiency       Past Surgical History:   Procedure Laterality Date    FOOT SURGERY  1978    Cyst remove right foot    LIPOMA RESECTION      TONSILLECTOMY  1980    TUBAL LIGATION        Social History     Socioeconomic History    Marital status: Married     Spouse name: Not on file    Number of children: Not on file    Years of education: Not on file    Highest education level: Not on file   Occupational History    Not on file   Tobacco Use    Smoking status: Former     Current packs/day: 0.00     Average packs/day: 1 pack/day for 20.0 years (20.0 ttl pk-yrs)     Types: Cigarettes     Start date: 10/05/1986     Quit date: 10/05/2006     Years since quitting: 16.3    Smokeless tobacco: Never    Tobacco comments:     Quit >15 years ago.   Vaping Use    Vaping Use: Never used   Substance and Sexual Activity    Alcohol use: Yes     Alcohol/week: 2.0  standard drinks of alcohol     Types: 1 Glasses of wine, 1 Cans of beer per week     Comment: This is a guess i do not drink every day but occasionally i    Drug use: Never    Sexual activity: Not Currently   Other Topics Concern    Not on file   Social History Narrative    Not on file     Social Determinants of Health     Financial Resource Strain: Not on file   Food Insecurity: Not on file   Transportation Needs: Not on file   Physical Activity: Not on file   Stress: Not on file   Social Connections: Not on file   Intimate Partner Violence: Not on file   Housing Stability: Not on file      Family History   Problem Relation Age of Onset    Alzheimer's Disease Mother     Dementia Father     Breast Cancer Paternal Aunt         Review of Systems   Constitutional: Negative.    HENT: Negative.     Eyes: Negative.    Respiratory: Negative.     Cardiovascular: Negative.    Gastrointestinal: Negative.    Endocrine: Negative.    Genitourinary: Negative.    Musculoskeletal:  Positive for arthralgias.   Skin: Negative.    Allergic/Immunologic: Negative.    Neurological: Negative.    Hematological: Negative.    Psychiatric/Behavioral:  Positive for dysphoric mood.         PHYSICAL EXAM:  Vital Signs -   Visit Vitals  BP 108/72   Pulse 77   Resp 18   Ht 1.651 m (5\' 5" )   Wt 112.3 kg (247 lb 9.6 oz)  SpO2 94%   BMI 41.20 kg/m        Physical Exam  Vitals and nursing note reviewed.   Constitutional:       General: She is not in acute distress.     Appearance: Normal appearance. She is obese.   HENT:      Head: Normocephalic and atraumatic.      Mouth/Throat:      Mouth: Mucous membranes are moist.      Pharynx: Oropharynx is clear. No oropharyngeal exudate or posterior oropharyngeal erythema.   Eyes:      Extraocular Movements: Extraocular movements intact.      Conjunctiva/sclera: Conjunctivae normal.      Pupils: Pupils are equal, round, and reactive to light.   Neck:      Vascular: No carotid bruit.   Cardiovascular:       Rate and Rhythm: Normal rate and regular rhythm.      Heart sounds: Normal heart sounds. No murmur heard.  Pulmonary:      Effort: Pulmonary effort is normal. No respiratory distress.      Breath sounds: Normal breath sounds.   Abdominal:      General: Bowel sounds are normal. There is no distension.      Palpations: Abdomen is soft.      Tenderness: There is no abdominal tenderness.   Musculoskeletal:         General: No swelling or tenderness. Normal range of motion.      Cervical back: Normal range of motion and neck supple.      Right lower leg: Edema (trace) present.      Left lower leg: Edema (trace) present.   Lymphadenopathy:      Cervical: No cervical adenopathy.   Skin:     General: Skin is warm and dry.      Findings: No erythema or rash.   Neurological:      General: No focal deficit present.      Mental Status: She is alert and oriented to person, place, and time. Mental status is at baseline.   Psychiatric:         Mood and Affect: Mood normal.         Behavior: Behavior normal.         Thought Content: Thought content normal.         Judgment: Judgment normal.          LABS  No results found for this visit on 01/27/23.  Orders Only on 09/29/2022   Component Date Value Ref Range Status    WBC 09/30/2022 6.7  3.4 - 10.8 x10E3/uL Final    RBC 09/30/2022 4.24  3.77 - 5.28 x10E6/uL Final    Hemoglobin 09/30/2022 13.2  11.1 - 15.9 g/dL Final    Hematocrit 16/07/9603 39.0  34.0 - 46.6 % Final    MCV 09/30/2022 92  79 - 97 fL Final    MCH 09/30/2022 31.1  26.6 - 33.0 pg Final    MCHC 09/30/2022 33.8  31.5 - 35.7 g/dL Final    RDW 54/06/8118 11.5 (L)  11.7 - 15.4 % Final    Neutrophils 09/30/2022 43  Not Estab. % Final    Lymphocytes 09/30/2022 43  Not Estab. % Final    Monocytes 09/30/2022 7  Not Estab. % Final    Eosinophils 09/30/2022 6  Not Estab. % Final    Basophils % 09/30/2022 1  Not Estab. % Final    Neutrophils Absolute 09/30/2022 2.9  1.4 - 7.0 x10E3/uL Final    Lymphocytes Absolute 09/30/2022 2.9   0.7 - 3.1 x10E3/uL Final    Monocytes Absolute 09/30/2022 0.5  0.1 - 0.9 x10E3/uL Final    Eosinophils Absolute 09/30/2022 0.4  0.0 - 0.4 x10E3/uL Final    Basophils Absolute 09/30/2022 0.1  0.0 - 0.2 x10E3/uL Final    Differential Comment 09/30/2022 Comment   Final    Verified by microscopic examination.    RBC Comment 09/30/2022 RBC's appear normal.  Normal Final    Platelet Comment 09/30/2022 Adequate  Adequate Final    Glucose 09/30/2022 103 (H)  70 - 99 mg/dL Final    Uric Acid 32/44/0102 6.4  3.0 - 7.2 mg/dL Final               Therapeutic target for gout patients: <6.0    BUN 09/30/2022 17  8 - 27 mg/dL Final    Creatinine 72/53/6644 0.81  0.57 - 1.00 mg/dL Final    Est, Glom Filt Rate 09/30/2022 83  >59 mL/min/1.73 Final    BUN/Creatinine Ratio 09/30/2022 21  12 - 28 Final    Sodium 09/30/2022 143  134 - 144 mmol/L Final    Potassium 09/30/2022 4.3  3.5 - 5.2 mmol/L Final    Chloride 09/30/2022 106  96 - 106 mmol/L Final    Calcium 09/30/2022 9.0  8.7 - 10.3 mg/dL Final    Phosphorus 03/47/4259 4.5 (H)  3.0 - 4.3 mg/dL Final    Total Protein 09/30/2022 6.4  6.0 - 8.5 g/dL Final    Albumin 56/38/7564 4.5  3.9 - 4.9 g/dL Final    Globulin, Total 09/30/2022 1.9  1.5 - 4.5 g/dL Final    Albumin/Globulin Ratio 09/30/2022 2.4 (H)  1.2 - 2.2 Final    Total Bilirubin 09/30/2022 0.5  0.0 - 1.2 mg/dL Final    Alkaline Phosphatase 09/30/2022 62  44 - 121 IU/L Final    LD 09/30/2022 159  119 - 226 IU/L Final    AST 09/30/2022 25  0 - 40 IU/L Final    ALT 09/30/2022 36 (H)  0 - 32 IU/L Final    GGT 09/30/2022 33  0 - 60 IU/L Final    Iron 09/30/2022 102  27 - 139 ug/dL Final    Cholesterol 33/29/5188 169  100 - 199 mg/dL Final    Triglycerides 09/30/2022 142  0 - 149 mg/dL Final    Hemoglobin C1Y 09/30/2022 5.9 (H)  4.8 - 5.6 % Final    Comment:          Prediabetes: 5.7 - 6.4           Diabetes: >6.4           Glycemic control for adults with diabetes: <7.0      Estimated Avg Glucose 09/30/2022 123  mg/dL Final    HDL  60/63/0160 49  >39 mg/dL Final    VLDL Cholesterol Calculated 09/30/2022 25  5 - 40 mg/dL Final    LDL Calculated 09/30/2022 95  0 - 99 mg/dL Final    Chol/HDL Ratio 09/30/2022 3.4  0.0 - 4.4 ratio Final    Comment:                                   T. Chol/HDL Ratio  Men  Women                                1/2 Avg.Risk  3.4    3.3                                    Avg.Risk  5.0    4.4                                 2X Avg.Risk  9.6    7.1                                 3X Avg.Risk 23.4   11.0         IMPRESSION/PLAN    1. Morbid obesity (HCC)  Pt will call to obesity medicine as she has had referral there, She does not think she would want surgery- she is hoping they would give her some medication to help with obesity.     2. Mild intermittent asthma, unspecified whether complicated  Cont to monitor, albuterol prn and cont symbicort daily    3. Prediabetes  Cont to monitor dietary control and exercise  - Hemoglobin A1c with eAG ([facility] DEFAULT); Future  - CMP12+8AC ([facility] DEFAULT); Future    4. Borderline hyperlipidemia  Monitor with dietary control and exercise  - CMP12+8AC ([facility] DEFAULT); Future  - Lipid Panel W/ Chol/HDL Ratio ([facility] DEFAULT); Future  - TSH with Reflex ([facility] DEFAULT); Future    5. Physical exam  Screening labs ordered for upcoming CPE  - Hemoglobin A1c with eAG ([facility] DEFAULT); Future  - CMP12+8AC ([facility] DEFAULT); Future  - Lipid Panel W/ Chol/HDL Ratio ([facility] DEFAULT); Future  - TSH with Reflex ([facility] DEFAULT); Future   Follow up and Dispositions:  Return for CPE with Dr Herma Carson in August.       Apolonio Schneiders Hazle Nordmann, MD

## 2023-02-12 ENCOUNTER — Encounter

## 2023-02-21 ENCOUNTER — Ambulatory Visit: Admit: 2023-02-21 | Discharge: 2023-02-21 | Payer: BLUE CROSS/BLUE SHIELD | Primary: Family Medicine

## 2023-02-21 DIAGNOSIS — R051 Acute cough: Secondary | ICD-10-CM

## 2023-02-21 MED ORDER — PREDNISONE 20 MG PO TABS
20 | ORAL_TABLET | Freq: Every day | ORAL | 0 refills | Status: AC
Start: 2023-02-21 — End: 2023-02-26

## 2023-02-21 MED ORDER — AZITHROMYCIN 250 MG PO TABS
250 | ORAL_TABLET | ORAL | 0 refills | Status: AC
Start: 2023-02-21 — End: 2023-03-03

## 2023-02-21 MED ORDER — BENZONATATE 200 MG PO CAPS
200 | ORAL_CAPSULE | Freq: Three times a day (TID) | ORAL | 0 refills | Status: AC | PRN
Start: 2023-02-21 — End: 2023-03-03

## 2023-02-21 NOTE — Progress Notes (Signed)
Subjective:      Patient ID: Georga Hacking [facility]     Chief Complaint Congestion ( About a week ago,  congestion loss of voice and cough mucus  and now it feels like its on chest ,  fatigue  and scratchy throat  headache )     Time Patient seen by Provider:1:51 PM     HPI The patient is a 62 y.o. female with pmhx OA, asthma presenting to the UC with congestion x 10 days. Pt reports that it started off as a cold, sxs have been worsening.She now has a productive cough, worsening pressure in her head and face. Reports green/yellow mucous production. Pt denies fever, chills, sob, abd pain, n/v/d/c, weakness, numbness, tingling, chest pain, palpitations, worsening complaints at this time.     Review of Systems   Constitutional:  Negative for chills, fatigue and fever.   HENT:  Positive for congestion, postnasal drip and rhinorrhea.    Respiratory:  Positive for cough. Negative for shortness of breath.    Cardiovascular:  Negative for chest pain and leg swelling.   Gastrointestinal:  Negative for abdominal pain, diarrhea, nausea and vomiting.   Genitourinary:  Negative for dysuria, flank pain and frequency.   Musculoskeletal:  Negative for back pain and gait problem.   Skin:  Negative for rash and wound.   Neurological:  Negative for dizziness and light-headedness.       Past Medical History:   Diagnosis Date    Abdominal pain     Asthma     Atelectasis     Colitis     Congenital pectus excavatum     Depression     Fatty liver     High cholesterol     Osteoarthritis     Pelvic pain in female     Sleep apnea     Vaginal atrophy     Venous insufficiency         Past Surgical History:   Procedure Laterality Date    FOOT SURGERY  1978    Cyst remove right foot    LIPOMA RESECTION      TONSILLECTOMY  1980    TUBAL LIGATION          Allergies   Allergen Reactions    Penicillins Anaphylaxis    Diclofenac Diarrhea        Current Outpatient Medications   Medication Sig Dispense Refill    azithromycin (ZITHROMAX) 250 MG tablet 500mg  on  day 1 followed by 250mg  on days 2 - 5 6 tablet 0    predniSONE (DELTASONE) 20 MG tablet Take 1 tablet by mouth daily for 5 days 5 tablet 0    benzonatate (TESSALON) 200 MG capsule Take 1 capsule by mouth 3 times daily as needed for Cough 30 capsule 0    ibuprofen (ADVIL;MOTRIN) 200 MG tablet Take 1 tablet by mouth every 6 hours as needed for Pain Patient does not know dose, she takes 2 tablets a day.      Estradiol (VAGIFEM) 10 MCG TABS vaginal tablet Place 1 tablet vaginally Twice a Week 24 tablet 3    Azelastine HCl 137 MCG/SPRAY SOLN       EPINEPHrine (EPIPEN) 0.3 MG/0.3ML SOAJ injection       fluticasone (FLONASE) 50 MCG/ACT nasal spray 1 spray by Each Nostril route daily 32 g 0    Cetirizine HCl (ZYRTEC ALLERGY) 10 MG CAPS 1 tablet Orally Once a day for 30 day(s)  diphenhydrAMINE HCl (BENADRYL ALLERGY PO) PRN      albuterol sulfate HFA (PROVENTIL;VENTOLIN;PROAIR) 108 (90 Base) MCG/ACT inhaler 1 puff as needed Inhalation every 4 hrs      budesonide-formoterol (SYMBICORT) 160-4.5 MCG/ACT AERO 2 puffs Inhalation Twice a day      diclofenac sodium (VOLTAREN) 1 % GEL Apply 4 g topically 4 times daily as needed for Pain Ninety day supply. Apply as directed. No more than two body parts. Stop if any GI upset. 300 g 0     No current facility-administered medications for this visit.        BP 136/76   Pulse 86   Temp 98.4 F (36.9 C)   Resp 16   Ht 1.676 m (5\' 6" )   Wt 111.1 kg (245 lb)   LMP  (LMP Unknown)   SpO2 94%   BMI 39.54 kg/m       Objective:   Physical Exam  Vitals and nursing note reviewed.   Constitutional:       Appearance: Normal appearance.   HENT:      Head: Normocephalic and atraumatic.      Nose: Congestion and rhinorrhea present.      Mouth/Throat:      Mouth: Mucous membranes are moist.      Pharynx: Posterior oropharyngeal erythema present. No oropharyngeal exudate.   Eyes:      Extraocular Movements: Extraocular movements intact.      Conjunctiva/sclera: Conjunctivae normal.       Pupils: Pupils are equal, round, and reactive to light.   Cardiovascular:      Rate and Rhythm: Normal rate and regular rhythm.      Pulses: Normal pulses.      Heart sounds: Normal heart sounds.   Pulmonary:      Effort: Pulmonary effort is normal. No respiratory distress.      Breath sounds: Normal breath sounds. No wheezing.      Comments: +cough  Skin:     Capillary Refill: Capillary refill takes less than 2 seconds.   Neurological:      General: No focal deficit present.      Mental Status: She is alert and oriented to person, place, and time.   Psychiatric:         Mood and Affect: Mood normal.           Assessment/Plan:   1. Acute cough  -     predniSONE (DELTASONE) 20 MG tablet; Take 1 tablet by mouth daily for 5 days, Disp-5 tablet, R-0Normal  -     benzonatate (TESSALON) 200 MG capsule; Take 1 capsule by mouth 3 times daily as needed for Cough, Disp-30 capsule, R-0Normal  2. Sinus congestion  -     azithromycin (ZITHROMAX) 250 MG tablet; 500mg  on day 1 followed by 250mg  on days 2 - 5, Disp-6 tablet, R-0Normal  Supportive care discussed  FU with PMD as needed   ER precautions discussed    No results found for any visits on 02/21/23.     I have reviewed prior visit notes and lab results pertinent to this visit.    Patient advised for any new, worsening or recurring symptoms to return to the nearest Emergency Department or Urgent Care. Patient advised regarding lab results, side effects of medications, diagnosis, and follow up. Pt verbalized understanding and return precautions discussed.     [place], PA-C

## 2023-03-10 ENCOUNTER — Telehealth

## 2023-03-10 ENCOUNTER — Ambulatory Visit
Admit: 2023-03-10 | Discharge: 2023-03-10 | Payer: BLUE CROSS/BLUE SHIELD | Attending: Internal Medicine | Primary: Family Medicine

## 2023-03-10 DIAGNOSIS — K760 Fatty (change of) liver, not elsewhere classified: Secondary | ICD-10-CM

## 2023-03-10 MED ORDER — PHENTERMINE HCL 15 MG PO CAPS
15 MG | ORAL_CAPSULE | Freq: Every morning | ORAL | 2 refills | Status: AC
Start: 2023-03-10 — End: 2023-06-08

## 2023-03-10 NOTE — Telephone Encounter (Signed)
Pt called back to let you know that the medication you want her to have it cost to much. She is asking about phentermine to be send in for her. Her insurance  will cover that.

## 2023-03-10 NOTE — Patient Instructions (Addendum)
-   Aim for 1600 calories at least per day, protein goal is NO less than 60 grams per day, ideally 80-100 grams  - Great job on water intake - non-sugar sweetened and non-caffeinated options count towards 64 oz goal   - If insurance coverage available, will initiate Zepbound 2.5 mg weekly - monitor for increased acid reflux, nausea, change in bowels, or abdominal pain. This would help target weight management, pre-diabetes, and fatty liver.   - Alternative plan will be to initiate phentermine 15 mg every morning. Please monitor for headache, vision changes, chest tightness, racing or pounding heart, anxiety, and insomnia. This would help target weight and potential benefit for concentration. This can be used long term (dose is equivalent to maximum phentermine dose in FDA approved Qsymia).   - If phentermine used, would consider utilizing metformin to target pre-diabetes/insulin resistance   - Discussed medications would be utilized long-term and potential barriers  - Try substitutions like Yasso bars for ice cream, consider freezing portein shakes to make a popsicle - (many recipes online as well)   -Continue to increase physical activity as able-will be greater focus at future with additional weight loss when more comfortable

## 2023-03-10 NOTE — Progress Notes (Signed)
Betty Dudley (DOB:  1961-07-09) is a 62 y.o. female who presents for follow-up evaluation for weight gain. Patient initially established with medical weight loss 12/23/2022.  Past medical history includes OSA, hepatic steatosis, pre-diabetes, asthma, venous insufficiency, bilateral osteoarthritis of knees, and adult ADD - neuropsych.         Subjective     HPI:    Patient is feeling better. Only part of program not sustainable is meeting protein goal. Protein shakes and bars (even different varieties) give her heartburn immediately. Always struggle with hunger and cravings and does not feel like she has never not struggled. Clothes fit a bit looser.     Current weight loss medication(s): None   Start date:  Weight at medication start:  Weight loss on medication:   Side effect:    Initial weight: 254.7 pounds  Last visit weight: 254.7 pounds  Current weight: 242.2 pounds  Weight loss from last visit: -12.5 pounds  Total weight loss: -12.5 pounds  Total body weight loss (%): -4.9     Initial BMI: 42.4  Current BMI: Body mass index is 40.3 kg/m.    Prior weight loss medication trials:   Wellbutrin - made her cry without provocation  Never trialed Vvyanse, trialed Ritalin and Adderall     Food diary:  2000 calories per day   []  None   [x]  App - [employer]   []  Handwritten  []  Pictures  []  Other    24-hour food recall:  Breakfast: skim milk, coffee, blueberries, [employer]ik[employer] yogurt, 2 Kodiak waffles  Lunch: ham, keto bread  Dinner: corn on the cob, [place], roast parsnips  Snacks: none     Typical day? [x]  Yes   [x]  No     Daily water intake:  []  < 64 oz    [x]  64 oz  [x]  > 64 oz     Other beverages: milk as above    Sugar-sweetened  [x]  Daily  [x]  Weekly  []  Monthly []  Rare   Artificially sweetened  []  Daily  []  Weekly  []  Monthly []  Rare  Carbonated   [x]  Daily  []  Weekly  []  Monthly []  Rare  Caffeinated   []  Daily  []  Weekly  []  Monthly []  Rare    Alcohol    []  Daily  []  Weekly  []  Monthly []  Rare  []  None    Physical  activity: []  None [x]  Structured []  Unstructured  Frequency per week: []  1 []  2 []  3 []  4 []  5 []  6 []  7   Type: [x]  Cardio []  Strength []  Group Classes []  Online Videos - walking   Length in minutes: []  10 [x]  20 []  30 []  40 []  50 []  60 []  60+    Sleep hours (at least 6 hours per night):  []  Adequate []  Inadequate   Wake rested in the morning:  []  Yes  []  No  [x]  Sometimes - improved   Uses CPAP: [] N/A [x]  Nightly []  Not wearing CPAP consistently []  Unable to tolerate  Medication use: [x] N/A []  Nightly []  Every other night []  <50% of nights      Current Outpatient Medications on File Prior to Visit   Medication Sig Dispense Refill    ibuprofen (ADVIL;MOTRIN) 200 MG tablet Take 1 tablet by mouth every 6 hours as needed for Pain Patient does not know dose, she takes 2 tablets a day.      Estradiol (VAGIFEM) 10 MCG TABS vaginal tablet Place 1 tablet  vaginally Twice a Week 24 tablet 3    Azelastine HCl 137 MCG/SPRAY SOLN       EPINEPHrine (EPIPEN) 0.3 MG/0.3ML SOAJ injection       fluticasone (FLONASE) 50 MCG/ACT nasal spray 1 spray by Each Nostril route daily 32 g 0    Cetirizine HCl (ZYRTEC ALLERGY) 10 MG CAPS 1 tablet Orally Once a day for 30 day(s)      diphenhydrAMINE HCl (BENADRYL ALLERGY PO) PRN      albuterol sulfate HFA (PROVENTIL;VENTOLIN;PROAIR) 108 (90 Base) MCG/ACT inhaler 1 puff as needed Inhalation every 4 hrs      budesonide-formoterol (SYMBICORT) 160-4.5 MCG/ACT AERO 2 puffs Inhalation Twice a day      diclofenac sodium (VOLTAREN) 1 % GEL Apply 4 g topically 4 times daily as needed for Pain Ninety day supply. Apply as directed. No more than two body parts. Stop if any GI upset. 300 g 0     No current facility-administered medications on file prior to visit.        Allergies   Allergen Reactions    Penicillins Anaphylaxis    Diclofenac Diarrhea        Past Medical History:   Diagnosis Date    Abdominal pain     Asthma     Atelectasis     Colitis     Congenital pectus excavatum     Depression     Fatty  liver     High cholesterol     Osteoarthritis     Pelvic pain in female     Sleep apnea     Vaginal atrophy     Venous insufficiency         Past Surgical History:   Procedure Laterality Date    FOOT SURGERY  1978    Cyst remove right foot    LIPOMA RESECTION      TONSILLECTOMY  1980    TUBAL LIGATION          Review of Systems                Objective     BP 133/82   Pulse 76   Temp 98.2 F (36.8 C)   Ht 1.651 m (5\' 5" )   Wt 109.9 kg (242 lb 3.2 oz)   LMP  (LMP Unknown)   SpO2 96%   BMI 40.30 kg/m         03/10/2023     9:04 AM 03/10/2023     9:03 AM   Weight Metrics   Weight  242 lb 3.2 oz   Waist Measure Inches 54.5 in    BMI (Calculated)  40.4 kg/m2            03/10/2023     9:04 AM 12/23/2022     9:00 AM   Medical Scale Data   Initial Height 5\' 5"  5\' 5"    Initial Weight 254 lb 11.2 oz 254 lb 0.7 oz   Weight 242 lb 3.2 oz 254 lb 0.7 oz   Total Weight Change -11.85 0   BMI 40.3    Initial Body Fat % 49.34 49.34   Body Fat % 48.55 49.34   Total Body Fat% Change -0.79 0   Fat Mass (lbs) 117.57 125.68   Fat Free Mass (lbs) 124.61 129.06   Water (lbs) 94.4 97.79   Waist (Inches) 54.5 53   Visceral Adipose Tissue (Index) 7.5 6.75   Protein Mass 30.21 31.27  Physical Exam:  General: alert, oriented, no acute distress  HEENT: no scleral icterus, EOMI grossly intact  Cardio: regular rate rhythm, no murmurs  Resp: clear to auscultation bilaterally  Extremities: trace pitting edema bilateral LE  Psych: mood-congruent affect, goal-oriented thought process      CMP:   Lab Results   Component Value Date    NA 143 09/30/2022    K 4.3 09/30/2022    CL 106 09/30/2022    CO2 23 01/09/2021    BUN 17 09/30/2022    CREATININE 0.81 09/30/2022    GLUCOSE 103 (H) 09/30/2022    CALCIUM 9.0 09/30/2022    BILITOT 0.5 09/30/2022    ALKPHOS 62 09/30/2022    AST 25 09/30/2022    ALT 36 (H) 09/30/2022    LABGLOM 83 09/30/2022    GFRAA 110 01/09/2021    AGRATIO 2.4 (H) 09/30/2022    GLOB 2.0 01/09/2021     CBC:   Lab Results    Component Value Date    WBC 6.7 09/30/2022    HGB 13.2 09/30/2022    HCT 39.0 09/30/2022    MCV 92 09/30/2022    PLT 308 01/09/2021    RBC 4.24 09/30/2022    MCH 31.1 09/30/2022    MCHC 33.8 09/30/2022    RDW 11.5 (L) 09/30/2022       Hemoglobin A1c:   Lab Results   Component Value Date    LABA1C 5.9 (H) 09/30/2022    LABA1C 5.8 01/09/2021     Total insulin: No results found for: "LABINSU"  Thyroid: No results found for: "TSHFT4", "TSH", "TSH3GEN"  Lipids:   Lab Results   Component Value Date    CHOL 169 09/30/2022    CHOL 212 (H) 01/09/2021     Lab Results   Component Value Date    TRIG 142 09/30/2022    TRIG 157 (H) 01/09/2021     Lab Results   Component Value Date    HDL 49 09/30/2022    HDL 52 01/09/2021     No components found for: "LDLCHOLESTEROL", "LDLCALC"  Lab Results   Component Value Date    VLDL 25 09/30/2022    VLDL 91.4 01/09/2021     Lab Results   Component Value Date    CHOLHDLRATIO 3.4 09/30/2022    CHOLHDLRATIO 4.1 01/09/2021        Vitamin D: No results found for: "VITD25"     Vitamin B12: No results found for: "VITAMINB12"     Folate:  No results found for: "FOLATE"  Iron:    Lab Results   Component Value Date/Time    IRON 102 09/30/2022 07:24 AM     Iron Saturation:  No components found for: "PERCENTFE"  TIBC:  No results found for: "TIBC"  Ferritin:  No results found for: "FERRITIN"    Assessment & Plan     1. Fatty liver  Presumed stable, continue weight loss efforts as outlined below.  Based on early studies, will utilize incretin agonist therapy if an option.    2. Pre-diabetes  Presumed improved with lifestyle modification.   - Consider utilizing metformin to target pre-diabetes/insulin resistance if unable to utilize incretin agonist therapy   - Continue to emphasize higher protein and reduced carbohydrate intake meal plan    3. Weight gain  Improving with efforts at lifestyle modification with preservation of lean muscle mass.  Working to find sustainability.  Reviewed medication  therapies generally are long-term and the benefits and  risks of individual options.  - Aim for 1600 calories at least per day, protein goal is NO less than 60 grams per day, ideally 80-100 grams  - Great job on water intake - non-sugar sweetened and non-caffeinated options count towards 64 oz goal   - If insurance coverage available, will initiate Zepbound 2.5 mg weekly - monitor for increased acid reflux, nausea, change in bowels, or abdominal pain. This would help target weight management, pre-diabetes, and fatty liver.   - Alternative plan will be to initiate phentermine 15 mg every morning. Please monitor for headache, vision changes, chest tightness, racing or pounding heart, anxiety, and insomnia. This would help target weight and potential benefit for concentration. This can be used long term (dose is equivalent to maximum phentermine dose in FDA approved Qsymia).   - Patient to send message after determining benefits for prescription to be sent to pharmacy  - Try substitutions like [place]asso bars for ice cream, consider freezing portein shakes to make a popsicle - (many recipes online as well)     4. Obstructive sleep apnea  Stable, continue consistent use of CPAP therapy for at least 6 hours per night.          Return in about 8 weeks (around 05/05/2023).           An electronic signature was used to authenticate this note.    --Lucianne Lei, DO

## 2023-03-18 LAB — LIPID PANEL
Cholesterol, Total: 169 mg/dL
HDL: 51 mg/dL (ref 35–70)
LDL Cholesterol: 98
Triglycerides: 114 mg/dL

## 2023-03-18 LAB — COMPREHENSIVE METABOLIC PANEL
ALT: 27 U/L
AST: 20 U/L
Creatinine: 0.89 mg/dL

## 2023-03-18 LAB — HEMOGLOBIN A1C: Hemoglobin A1C: 5.9 %

## 2023-03-25 NOTE — Telephone Encounter (Signed)
From: Margret Chance  To: Dr. Lucianne Lei  Sent: 03/24/2023 5:48 PM EDT  Subject: Medicine     Hi Dr Chelsea Primus    I am going to stop taking the Phentermine. I am very nervous and I cannot sleep. It is the only thing I can think of that has changed. I looked at the side effects and insomnia and restlessness are two of them. I have been up for long stretches at night like 3 hours or more and I am anxious/nervous/restless a lot during the day. When I am up in the middle of the night a lot of the time I eat so I don't think it could be helping the weight loss.    Thank you   Gweneth Dimitri

## 2023-03-29 ENCOUNTER — Inpatient Hospital Stay: Admit: 2023-03-29 | Payer: BLUE CROSS/BLUE SHIELD | Primary: Family Medicine

## 2023-03-29 DIAGNOSIS — F17211 Nicotine dependence, cigarettes, in remission: Secondary | ICD-10-CM

## 2023-04-07 ENCOUNTER — Encounter: Primary: Family Medicine

## 2023-04-22 NOTE — Telephone Encounter (Signed)
Called patient. I need to speeak with her about her appt on 05/05/23. Left a message asking her to call me.

## 2023-05-02 ENCOUNTER — Ambulatory Visit: Admit: 2023-05-02 | Discharge: 2023-05-02 | Payer: BLUE CROSS/BLUE SHIELD | Primary: Family Medicine

## 2023-05-02 DIAGNOSIS — U071 COVID-19: Secondary | ICD-10-CM

## 2023-05-02 MED ORDER — PREDNISONE 50 MG PO TABS
50 | ORAL_TABLET | Freq: Every day | ORAL | 0 refills | Status: AC
Start: 2023-05-02 — End: 2023-05-07

## 2023-05-02 MED ORDER — BENZONATATE 200 MG PO CAPS
200 | ORAL_CAPSULE | Freq: Three times a day (TID) | ORAL | 0 refills | Status: AC | PRN
Start: 2023-05-02 — End: 2023-05-12

## 2023-05-02 NOTE — Progress Notes (Signed)
Subjective:      Patient ID: Georga Hacking [facility]     Chief Complaint Positive For Covid-19 (Positive on At-Home Covid test. All symptoms notice initially on Thursday night.), Fatigue, Headache, Cough, Generalized Body Aches, and Chills     Time Patient seen by Provider:10:22 AM     HPI The patient is a 62 y.o. female with pmhx asthma presenting to the UC with positive at home covid test, sxs started on Thursday night (04/29/23). States that she has nasal congestion and headache. Mild sore throat. Started to have a dry cough this AM. She wanted to discuss medications to see if there was anything she could take to help her feel better. Pt denies recent hospitalizations or intubations secondary to her asthma. No heart disease. Pt denies fever, chills, sob, abd pain, n/v/d/c, weakness, numbness, tingling, chest pain, palpitations, worsening complaints at this time.     Review of Systems   Constitutional:  Negative for fever.   HENT:  Positive for congestion, postnasal drip and rhinorrhea.    Eyes:  Negative for discharge and itching.   Respiratory:  Positive for cough. Negative for chest tightness and shortness of breath.    Cardiovascular:  Negative for chest pain, palpitations and leg swelling.   Gastrointestinal:  Negative for abdominal pain, diarrhea and nausea.   Genitourinary:  Negative for dysuria and flank pain.   Musculoskeletal:  Negative for back pain and myalgias.   Skin:  Negative for rash.   Neurological:  Negative for light-headedness and headaches.   Psychiatric/Behavioral:  Negative for self-injury and suicidal ideas.        Past Medical History:   Diagnosis Date    Abdominal pain     Asthma     Atelectasis     Colitis     Congenital pectus excavatum     Depression     Fatty liver     High cholesterol     Osteoarthritis     Pelvic pain in female     Sleep apnea     Vaginal atrophy     Venous insufficiency         Past Surgical History:   Procedure Laterality Date    FOOT SURGERY  1978    Cyst remove right foot     LIPOMA RESECTION      TONSILLECTOMY  1980    TUBAL LIGATION          Allergies   Allergen Reactions    Penicillins Anaphylaxis    Diclofenac Diarrhea        Current Outpatient Medications   Medication Sig Dispense Refill    benzonatate (TESSALON) 200 MG capsule Take 1 capsule by mouth 3 times daily as needed for Cough 30 capsule 0    predniSONE (DELTASONE) 50 MG tablet Take 1 tablet by mouth daily for 5 days 5 tablet 0    ibuprofen (ADVIL;MOTRIN) 200 MG tablet Take 1 tablet by mouth every 6 hours as needed for Pain Patient does not know dose, she takes 2 tablets a day.      Estradiol (VAGIFEM) 10 MCG TABS vaginal tablet Place 1 tablet vaginally Twice a Week 24 tablet 3    Azelastine HCl 137 MCG/SPRAY SOLN       EPINEPHrine (EPIPEN) 0.3 MG/0.3ML SOAJ injection       fluticasone (FLONASE) 50 MCG/ACT nasal spray 1 spray by Each Nostril route daily 32 g 0    Cetirizine HCl (ZYRTEC ALLERGY) 10 MG CAPS 1 tablet Orally  Once a day for 30 day(s)      diphenhydrAMINE HCl (BENADRYL ALLERGY PO) PRN      albuterol sulfate HFA (PROVENTIL;VENTOLIN;PROAIR) 108 (90 Base) MCG/ACT inhaler 1 puff as needed Inhalation every 4 hrs      budesonide-formoterol (SYMBICORT) 160-4.5 MCG/ACT AERO 2 puffs Inhalation Twice a day      phentermine 15 MG capsule Take 1 capsule by mouth every morning for 90 days. Max Daily Amount: 15 mg (Patient not taking: Reported on 05/02/2023) 30 capsule 2    diclofenac sodium (VOLTAREN) 1 % GEL Apply 4 g topically 4 times daily as needed for Pain Ninety day supply. Apply as directed. No more than two body parts. Stop if any GI upset. 300 g 0     No current facility-administered medications for this visit.        BP 126/86   Pulse 83   Temp 98.6 F (37 C)   Resp 16   Ht 1.676 m (5\' 6" )   Wt 106.4 kg (234 lb 9.6 oz)   LMP  (LMP Unknown)   SpO2 95%   BMI 37.87 kg/m       Objective:   Physical Exam  Vitals and nursing note reviewed.   Constitutional:       Appearance: Normal appearance.   HENT:       Head: Normocephalic and atraumatic.      Right Ear: Tympanic membrane normal.      Left Ear: Tympanic membrane normal.      Nose: Congestion and rhinorrhea present.      Mouth/Throat:      Mouth: Mucous membranes are moist.      Pharynx: Posterior oropharyngeal erythema present. No oropharyngeal exudate.   Eyes:      Extraocular Movements: Extraocular movements intact.      Conjunctiva/sclera: Conjunctivae normal.      Pupils: Pupils are equal, round, and reactive to light.   Cardiovascular:      Rate and Rhythm: Normal rate and regular rhythm.      Pulses: Normal pulses.      Heart sounds: Normal heart sounds.   Pulmonary:      Effort: Pulmonary effort is normal. No respiratory distress.      Breath sounds: Normal breath sounds. No wheezing.   Musculoskeletal:      Cervical back: Normal range of motion and neck supple. No rigidity or tenderness.      Comments: Ambulatory with steady gait   Skin:     General: Skin is warm.      Capillary Refill: Capillary refill takes less than 2 seconds.   Neurological:      Mental Status: She is alert and oriented to person, place, and time. Mental status is at baseline.   Psychiatric:         Mood and Affect: Mood normal.           Assessment/Plan:   1. Positive self-administered antigen test for COVID-19  -     benzonatate (TESSALON) 200 MG capsule; Take 1 capsule by mouth 3 times daily as needed for Cough, Disp-30 capsule, R-0Normal  -     predniSONE (DELTASONE) 50 MG tablet; Take 1 tablet by mouth daily for 5 days, Disp-5 tablet, R-0Normal     Paxlovid discussed, SE and potential med interactions discussed, pt declined paxlovid at this time  Supportive care discussed  For any new, worsening or recurring sxs advised to go to the ER  FU with PMD within 1  week to ensure sxs are improving    No results found for any visits on 05/02/23.     I have reviewed prior visit notes and lab results pertinent to this visit.    Patient advised for any new, worsening or recurring symptoms to  return to the nearest Emergency Department or Urgent Care. Patient advised regarding lab results, side effects of medications, diagnosis, and follow up. Pt verbalized understanding and return precautions discussed.     [place], PA-C

## 2023-05-04 ENCOUNTER — Encounter: Attending: Physical Medicine & Rehabilitation | Primary: Family Medicine

## 2023-05-04 NOTE — Progress Notes (Unsigned)
Sharn Parlin Doukas (DOB:  [birth year]) is a 62 y.o. female,Established patient, here for evaluation of the following chief complaint(s):  No chief complaint on file.      SUBJECTIVE/OBJECTIVE:  HPI  (OV     04/22/2023):  Patient returns for follow up today     Any MEDICATION REFILLS needed today?    Any new imaging studies?    Any significant changes in your symptoms since last visit with Dr. Trixie Dredge?    Any side effects to your medications that Dr. Trixie Dredge prescribes?    Any recent injections/procedures(who, where)?    Any recent Physical Therapy(and where)?    Any other recent treatments that you have engaged in during the past 90 days(ie. Massage therapy, other medications(ie.OTC medications), chiropractic, massage therapy,etc):     Is your current level of pain recently stable?    What is your pain level today in the office?    Any radiating pain or numbness/tingling into the upper or lower extremities?    Functional improvement with medication(yes/no)?    Any changes in overall health?    Most recent UDS? N/A    Pain Contract(yes/no/NA): N/A    Review of Systems    LMP  (LMP Unknown)    Physical Exam    {[id number]}    ASSESSMENT/PLAN:  {There are no diagnoses linked to this encounter. (Refresh or delete this SmartLink)}    No follow-ups on file.    ***    Erick Alley, DO  Board Certified Physical Medicine and Rehabilitation  Board Certified Pain Medicine  Spine/Musculoskeletal Fellowship Trained

## 2023-05-04 NOTE — Telephone Encounter (Signed)
PATIENT CALLED AND STATED THAT SHE HAS APT W/DR. MINTER ON 05/06/23 BUT SHE HAS COVID.  I GOT HER RS W/ANDREA FOR 06/02/23 AND THEN SHE CAN FOLLOW UP W/YOU NEXT IF SHE WANTS.  YOU WERE BOOKED OUT. I DID PUT HER ON THE WAIT LIST. FYI

## 2023-05-05 ENCOUNTER — Encounter: Payer: BLUE CROSS/BLUE SHIELD | Attending: Physical Medicine & Rehabilitation | Primary: Family Medicine

## 2023-05-11 ENCOUNTER — Encounter: Payer: BLUE CROSS/BLUE SHIELD | Attending: Physical Medicine & Rehabilitation | Primary: Family Medicine

## 2023-05-18 ENCOUNTER — Encounter
Admit: 2023-05-18 | Discharge: 2023-05-18 | Payer: BLUE CROSS/BLUE SHIELD | Attending: Family Medicine | Primary: Family Medicine

## 2023-05-18 DIAGNOSIS — Z0000 Encounter for general adult medical examination without abnormal findings: Secondary | ICD-10-CM
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
## 2023-05-19 ENCOUNTER — Ambulatory Visit
Admit: 2023-05-19 | Discharge: 2023-05-19 | Payer: BLUE CROSS/BLUE SHIELD | Attending: Physical Medicine & Rehabilitation | Primary: Family Medicine

## 2023-05-19 DIAGNOSIS — M47812 Spondylosis without myelopathy or radiculopathy, cervical region: Secondary | ICD-10-CM

## 2023-05-19 NOTE — Progress Notes (Signed)
Betty Dudley (DOB:  03-30-1961) is a 62 y.o. female,Established patient, here for evaluation of the following chief complaint(s):  Neck Pain and Follow-up          SUBJECTIVE/OBJECTIVE:  HPI  (OV     05/19/2023):  Patient returns for follow up today and she has complaint of currently mild neck pain.  She also reports some recurrent bilateral knee pain which is also described as mild at this time.  She had previous bilateral knee injections for significant medial compartment osteoarthritis with Dr. Maple Hudson back in March 2024.  Patient responded very well to the steroid injections for the bilateral knees.  She starting to report some recurrent bilateral knee pain but symptoms are currently mild.  She has not been taking any NSAIDs.  She found that previous use of Celebrex was not helpful.  We recommended topical Voltaren gel but she does not report using it recently because her knees were doing pretty well and did not feel like she needed it.  Recommended revisiting the Voltaren gel as a trial to see how she does with that.  She tolerated the Voltaren gel without any side effects.  Patient also saw me previously for some neck pain.  She has some known prominent spondylosis/DDD on previous cervical x-ray.  She has strictly axial neck pain which is really not particular painful right now but more of a tightness in the neck or stiffness.  No significant neck pain currently.  No radicular complaints.    Patient also reports recently having COVID but reports she is now back to her baseline and feels fine now.    Any MEDICATION REFILLS needed today? No.    Any new imaging studies? No.    Any significant changes in your symptoms since last visit with Dr. Trixie Dredge?   Pain in the hips and the knees.     Any side effects to your medications that Dr. Trixie Dredge prescribes? None.    Any recent injections/procedures(who, where)? No.    Any recent Physical Therapy(and where)? Not this year.    Any other recent treatments that you have  engaged in during the past 90 days(ie. Massage therapy, other medications(ie.OTC medications), chiropractic, massage therapy,etc): Trying to walk.    Is your current level of pain recently stable? Yes.    What is your pain level today in the office? 2-3/10 in the knees.  In the hips 3-4/10.    Any radiating pain or numbness/tingling into the upper or lower extremities? No. Same numbness she has had for years.    Functional improvement with medication(yes/no)? Yes.    Any changes in overall health? No.    HISTORICAL INFORMATION  (OV    12/02/2022):  Patient returns for follow up today.  Patient comes in today and reports she recently had the flu and was having a lot of coughing.  She was given Decadron 6 mg to take for 5 days.  She noted that her neck pain and knee pain associated with arthritis felt significantly better while on the steroids and continues to feel better at this time.  Current pain is pretty minimal.  She will be seeing Dr. Hilton Sinclair regarding her bilateral knee pain and osteoarthritis.  She has recently taken Celebrex for her neck pain and arthritis in the knees and is not noticing a lot of benefit from the Celebrex.  She was interested in an alternate NSAID.    Review of Systems   Constitutional: Negative.  Negative for chills and  fever.        Recent flu last week diagnosed on Tuesday.  Residual cough symptoms currently.  No current fevers or chills.   Respiratory:          Asthma   Gastrointestinal: Negative.    Genitourinary: Negative.    Musculoskeletal:  Positive for arthralgias and neck pain. Negative for gait problem.        Bilateral knee pain   Neurological: Negative.  Negative for seizures, weakness and numbness.        Numbness in the left leg and foot.       BP 131/74   Pulse 74   Temp 97.8 F (36.6 C)   Ht 1.676 m (5\' 6" )   Wt 109.9 kg (242 lb 4.8 oz)   LMP  (LMP Unknown)   SpO2 96%   BMI 39.11 kg/m    Physical Exam  Constitutional:       General: She is not in acute  distress.     Appearance: She is obese.   Musculoskeletal:      Cervical back: Decreased range of motion.      Right knee: Crepitus present. Tenderness present over the medial joint line.      Left knee: Crepitus present. Tenderness present over the medial joint line.      Comments: Mild crepitus   Neurological:      Mental Status: She is alert and oriented to person, place, and time.      Motor: Motor function is intact. No weakness.      Gait: Gait is intact.       07/09/22    Luvenia Heller, PA  I ordered and interpreted x-rays of cervical spine.  They show evidence of arthritic changes and degenerative disc disease.  Significant disc space narrowing at C4-5 and C5-6.  No evidence of instability    12/17/2022 Dr. Hilton Sinclair    X/R's B/L Knees: significant B/L medial compartment narrowing with OA.      CMP:    Lab Results   Component Value Date/Time    NA 143 09/30/2022 07:24 AM    K 4.3 09/30/2022 07:24 AM    CL 106 09/30/2022 07:24 AM    CO2 23 01/09/2021 12:13 PM    BUN 17 09/30/2022 07:24 AM    CREATININE 0.89 03/16/2023 12:00 AM    GFRAA 110 01/09/2021 12:13 PM    AGRATIO 2.4 09/30/2022 07:24 AM    LABGLOM 83 09/30/2022 07:24 AM    GLUCOSE 103 09/30/2022 07:24 AM    CALCIUM 9.0 09/30/2022 07:24 AM    BILITOT 0.5 09/30/2022 07:24 AM    ALKPHOS 62 09/30/2022 07:24 AM    AST 20 03/16/2023 12:00 AM    ALT 27 03/16/2023 12:00 AM       ASSESSMENT/PLAN:  1. Cervical spondylosis  2. Decreased ROM of neck  3. Chronic neck pain  4. Chronic pain of both knees  5. Bilateral primary osteoarthritis of knee      Discussed her medications at length.     We discussed having her revisit the topical Voltaren gel for treatment for her knee arthritis.  Patient is currently pain is mild.  We had a lengthy discussion about treatment options.  She does not feel like she yet needs knee injections with steroids at this time.  She did benefit nicely from steroid injections with Dr. Maple Hudson previously for the bilateral knee arthritis.      Reviewed previous x-ray reports and  films independently.  Patient has significant medial compartment osteoarthritis bilaterally in the knees.      Patient reports her neck is actually doing pretty good right now although she does have diminished range of motion.    I had a long discussion about NSAID medications.  Also discussed steroids.    Patient was provided (*or currently takes) an anti-inflammatory medication (NSAID).  I discussed medication including dosing, possible side effects, and precautions.  Discussed potential GI, renal, and cardiac side effects of NSAIDs.  Patient understands to stop the medication with any GI upset.  Patient also understands only one NSAID at a time as prescribed.      Patient will be seeing orthopedics-Dr. Maple Hudson for further evaluation of her knee osteoarthritis PRN.    Recommended she resume the VOLTAREN GEL PRN.  We reviewed the medication, dosing, application, etc.  Reviewed possible side effects and precautions.    Patient can follow-up with me as needed.        Return if symptoms worsen or fail to improve.    OV4: >30 minutes,  and/or was compliant with coding and billing requirements of some combination of the following: review of history, multiple diagnoses evaluated/addressed, examination, review of SCRIPTS, medication management, significant acute on chronic exacerbation of symptoms, review of other provider notes, review of labs, discussion of diagnosis(s) and treatment options, discussion of spinal injection procedure, referral for spinal injection procedure, review of imaging report and films independently, coordination of care, and documentation.     I have seen and examined the patient independently.  I reviewed all laboratory and imaging studies that are relevant.  I have reviewed and made any appropriate changes to the HPI.    Electronically signed by Lanney Gins, DO on 05/19/2023 at 12:37 PM     J. Carlynn Purl, DO  Board Certified Physical Medicine and  Rehabilitation  Board Certified Pain Medicine  Spine/Musculoskeletal Fellowship Trained

## 2023-06-02 ENCOUNTER — Ambulatory Visit
Admit: 2023-06-02 | Discharge: 2023-06-02 | Payer: BLUE CROSS/BLUE SHIELD | Attending: Emergency | Primary: Family Medicine

## 2023-06-02 VITALS — BP 130/80 | HR 73 | Temp 98.70000°F | Ht 65.0 in | Wt 237.2 lb

## 2023-06-02 DIAGNOSIS — E66812 Obesity, class 2: Secondary | ICD-10-CM

## 2023-06-02 NOTE — Assessment & Plan Note (Signed)
 Compliant with CPAP night.  Should not impact metabolism.

## 2023-06-02 NOTE — Progress Notes (Signed)
 Betty Dudley (DOB:  1961/07/04) is a 62 y.o. female who presents for follow-up evaluation for weight gain. Patient initially established with medical weight loss 12/23/2022.  Past medical history includes OSA, hepatic steatosis, pre-diabetes, asthma, venous insufficiency, bilateral osteoarthritis of knees, and adult ADD - neuropsych. Last seen 03/15/2023        Subjective     HPI:    Pt was started on Phentermine  however had to stop because it cause anxiety and insomnia. She was taking a full tablet of the phentermine  at the time.  Zepbound  was denied unfortunately stating is wasn't covered. She sts she knows what she should do but has been struggling.  She needs to exercise more.  She sts that she needs to cut her calories and logging her meals.  She is averaging 2000 calories per day.  She is much more active than she was.    She is compliant with her CPAP machine every night.  Sleep is reasonable.    Current weight loss medication(s): Phentermine    Start date: 03/2023  Weight at medication start:  Weight loss on medication:   Side effect: anxiety    Initial weight: 254.7 pounds  Last visit weight: 242.2 pounds  Current weight: 237.2 pounds  Weight loss from last visit: -5 pounds  Total weight loss: -17.5 pounds  Total body weight loss (%): -4.9     Initial BMI: 42.4  Current BMI: Body mass index is 39.47 kg/m.    Prior weight loss medication trials:   Wellbutrin - made her cry without provocation  Never trialed Vvyanse, trialed Ritalin and Adderall     Food diary:  2000 calories per day   []  None   [x]  App - [employer]   []  Handwritten  []  Pictures  []  Other    24-hour food recall:  Breakfast: pear, yogurt, coffee, skim milk, oatmeal  Snack:  high protein, yogurt bar  Lunch: asparagus, [place]  stip  Dinner: broccoli, [place]  strip, olive oil  Snacks: none     Typical day? [x]  Yes   [x]  No     Daily water intake:  []  < 64 oz    [x]  64 oz  [x]  > 64 oz     Other beverages: milk as above    Sugar-sweetened  [x]  Daily   [x]  Weekly  []  Monthly []  Rare   Artificially sweetened  []  Daily  []  Weekly  []  Monthly []  Rare  Carbonated   [x]  Daily  []  Weekly  []  Monthly []  Rare  Caffeinated   []  Daily  []  Weekly  []  Monthly []  Rare    Alcohol    []  Daily  []  Weekly  []  Monthly []  Rare  []  None    Physical activity: []  None [x]  Structured []  Unstructured  Frequency per week: []  1 []  2 []  3 []  4 []  5 []  6 []  7   Type: [x]  Cardio []  Strength []  Group Classes []  Online Videos - walking   Length in minutes: []  10 [x]  20 []  30 []  40 []  50 []  60 []  60+    Sleep hours (at least 6 hours per night):  []  Adequate []  Inadequate   Wake rested in the morning:  []  Yes  []  No  [x]  Sometimes - improved   Uses CPAP: [] N/A [x]  Nightly []  Not wearing CPAP consistently []  Unable to tolerate  Medication use: [x] N/A []  Nightly []  Every other night []  <50% of nights  Current Outpatient Medications on File Prior to Visit   Medication Sig Dispense Refill    diclofenac  sodium (VOLTAREN ) 1 % GEL Apply topically 4 times daily as needed for Pain      ibuprofen (ADVIL;MOTRIN) 200 MG tablet Take 1 tablet by mouth every 6 hours as needed for Pain Patient does not know dose, she takes 2 tablets a day.      Estradiol  (VAGIFEM ) 10 MCG TABS vaginal tablet Place 1 tablet vaginally Twice a Week 24 tablet 3    Azelastine HCl 137 MCG/SPRAY SOLN       EPINEPHrine (EPIPEN) 0.3 MG/0.3ML SOAJ injection       fluticasone  (FLONASE ) 50 MCG/ACT nasal spray 1 spray by Each Nostril route daily 32 g 0    Cetirizine HCl (ZYRTEC ALLERGY) 10 MG CAPS 1 tablet Orally Once a day for 30 day(s)      diphenhydrAMINE HCl (BENADRYL ALLERGY PO) PRN      albuterol sulfate HFA (PROVENTIL;VENTOLIN;PROAIR) 108 (90 Base) MCG/ACT inhaler 1 puff as needed Inhalation every 4 hrs      budesonide-formoterol (SYMBICORT) 160-4.5 MCG/ACT AERO 2 puffs Inhalation Twice a day       No current facility-administered medications on file prior to visit.        Allergies   Allergen Reactions    Penicillins Anaphylaxis     Diclofenac  Diarrhea        Past Medical History:   Diagnosis Date    Abdominal pain     Asthma     Atelectasis     Colitis     Congenital pectus excavatum     Depression     Fatty liver     High cholesterol     Osteoarthritis     Pelvic pain in female     Sleep apnea     Vaginal atrophy     Venous insufficiency         Past Surgical History:   Procedure Laterality Date    FOOT SURGERY  1978    Cyst remove right foot    LIPOMA RESECTION      TONSILLECTOMY  1980    TUBAL LIGATION          Review of Systems   Constitutional:  Negative for fatigue.   HENT:  Negative for trouble swallowing.    Respiratory:  Negative for shortness of breath.    Cardiovascular:  Negative for chest pain and palpitations.   Gastrointestinal:  Negative for abdominal pain, constipation, diarrhea, nausea and vomiting.   Genitourinary:         Admits waking up at night to urinate    Musculoskeletal:  Negative for arthralgias.   Neurological:  Negative for dizziness, seizures and headaches.   Psychiatric/Behavioral:  The patient is nervous/anxious.    All other systems reviewed and are negative.                  Objective     BP 130/80   Pulse 73   Temp 98.7 F (37.1 C)   Ht 1.651 m (5' 5)   Wt 107.6 kg (237 lb 3.2 oz)   LMP  (LMP Unknown)   SpO2 96%   BMI 39.47 kg/m         06/02/2023     9:47 AM 06/02/2023     9:46 AM   Weight Metrics   Weight  237 lb 3.2 oz   Waist Measure Inches 52 in    BMI (Calculated)  39.6 kg/m2            06/02/2023     9:47 AM 03/10/2023     9:04 AM   Medical Scale Data   Initial Height 5' 5 5' 5   Initial Weight 254 lb 11.8 oz 254 lb 11.2 oz   Weight 237 lb 3.5 oz 242 lb 3.2 oz   Total Weight Change -17.48 -11.85   BMI 39.47 40.3   Initial Body Fat % 49.34 49.34   Body Fat % 46.37 48.55   Total Body Fat% Change -2.97 -0.79   Fat Mass (lbs) 110 117.57   Fat Free Mass (lbs) 127.21 124.61   Water (lbs) 96.38 94.4   Waist (Inches) 52 54.5   Visceral Adipose Tissue (Index) 6.29 7.5   Protein Mass 30.83 30.21           Physical Exam:  General: alert, oriented, no acute distress  HEENT: no scleral icterus, EOMI grossly intact  Cardio: regular rate rhythm, no murmurs  Resp: clear to auscultation bilaterally  Extremities: trace pitting edema bilateral LE  Psych: mood-congruent affect, goal-oriented thought process      CMP:   Lab Results   Component Value Date    NA 143 09/30/2022    K 4.3 09/30/2022    CL 106 09/30/2022    CO2 23 01/09/2021    BUN 17 09/30/2022    CREATININE 0.89 03/16/2023    GLUCOSE 103 (H) 09/30/2022    CALCIUM 9.0 09/30/2022    BILITOT 0.5 09/30/2022    ALKPHOS 62 09/30/2022    AST 20 03/16/2023    ALT 27 03/16/2023    LABGLOM 83 09/30/2022    GFRAA 110 01/09/2021    AGRATIO 2.4 (H) 09/30/2022    GLOB 2.0 01/09/2021     CBC:   Lab Results   Component Value Date    WBC 6.7 09/30/2022    HGB 13.2 09/30/2022    HCT 39.0 09/30/2022    MCV 92 09/30/2022    PLT 308 01/09/2021    RBC 4.24 09/30/2022    MCH 31.1 09/30/2022    MCHC 33.8 09/30/2022    RDW 11.5 (L) 09/30/2022       Hemoglobin A1c:   Lab Results   Component Value Date    LABA1C 5.9 03/16/2023    LABA1C 5.9 (H) 09/30/2022     Total insulin: No results found for: LABINSU  Thyroid: No results found for: TSHFT4, TSH, TSH3GEN  Lipids:   Lab Results   Component Value Date    CHOL 169 03/16/2023    CHOL 169 09/30/2022     Lab Results   Component Value Date    TRIG 114 03/16/2023    TRIG 142 09/30/2022     Lab Results   Component Value Date    HDL 51 03/16/2023    HDL 49 09/30/2022     No components found for: LDLCHOLESTEROL, [facility]  Lab Results   Component Value Date    VLDL 25 09/30/2022    VLDL 68.5 01/09/2021     Lab Results   Component Value Date    CHOLHDLRATIO 3.4 09/30/2022    CHOLHDLRATIO 4.1 01/09/2021        Vitamin D: No results found for: VITD25     Vitamin B12: No results found for: VITAMINB12     Folate:  No results found for: FOLATE  Iron:    Lab Results   Component Value Date/Time    IRON 102  09/30/2022 07:24 AM     Iron  Saturation:  No components found for: PERCENTFE  TIBC:  No results found for: TIBC  Ferritin:  No results found for: FERRITIN    Assessment & Plan     Assessment & Plan  Class 2 severe obesity with serious comorbidity and body mass index (BMI) of 36.0 to 36.9 in adult, unspecified obesity type (HCC)    Original goal was 160  calories per day.  Sts that she is mostly getting around 1800-2000 calories per day but well meeting her protein goal.  Will increase her calorie goal to 1800 calories per day.  Continue to log meal as this is the #1 key to success.  Try to make physical activity a routine by using resistance bands or just walking for 30 mins 5 days a week.  May break this down into 10 mins increments.  Encouraged 80-100 oz. Water.      Formally stop Phentermine  due to side effects. Zepbound  will not be covered.           Pre-diabetes   limit starch and sugar in diet.Presumed improved with lifestyle modification.   - Consider utilizing metformin to target pre-diabetes/insulin resistance if unable to utilize incretin agonist therapy   - Continue to emphasize higher protein and reduced carbohydrate intake meal plan         Obstructive sleep apnea   Compliant with CPAP night.  Should not impact metabolism.          Hepatic steatosis     Presumed stable, continue weight loss efforts as outlined below.  Based on early studies, will utilize incretin agonist therapy if an option.                 Return in about 8 weeks (around 07/28/2023) for Weight loss Mgmt.       On 06/02/23 I spent 30-39 mins  reviewing any available diagnostic tests, obtained history, previous visit notes, and interpreting any test results or imaging as necessary in preparation for this patient visit. Any necessary examinations and/or evaluations were performed during this visit. Any necessary medications, tests and/or procedures were also ordered. Time was spent in coordination of care and/or counseling and educating the patient and any  present family adult nurse. The education included counseling on medication use, side effects, treatment goals, proper way to take medications, symptom management with nonprescription options and the pathophysiology of the condition as appropriate. Education was also given regarding the disease process and any medications prescribed as appropriate. Plan of care was reviewed with the patient and the patient agreed with that plan. All questions were answered. Total time spent also includes documenting clinical information in the electronic record prior to 11:59 PM on the date of service.      An electronic signature was used to authenticate this note.    --Bhavana Kady, APRN - NP

## 2023-06-02 NOTE — Assessment & Plan Note (Addendum)
 limit starch and sugar in diet.Presumed improved with lifestyle modification.   - Consider utilizing metformin to target pre-diabetes/insulin resistance if unable to utilize incretin agonist therapy   - Continue to emphasize higher protein and reduced carbohydrate intake meal plan

## 2023-06-02 NOTE — Assessment & Plan Note (Addendum)
 Original goal was 160  calories per day.  Sts that she is mostly getting around 1800-2000 calories per day but well meeting her protein goal.  Will increase her calorie goal to 1800 calories per day.  Continue to log meal as this is the #1 key to success.  Try to make physical activity a routine by using resistance bands or just walking for 30 mins 5 days a week.  May break this down into 10 mins increments.  Encouraged 80-100 oz. Water.      Formally stop Phentermine  due to side effects. Zepbound  will not be covered.

## 2023-06-03 ENCOUNTER — Ambulatory Visit
Admit: 2023-06-03 | Discharge: 2023-06-03 | Payer: BLUE CROSS/BLUE SHIELD | Attending: Surgery | Primary: Family Medicine

## 2023-06-03 VITALS — Ht 65.0 in | Wt 238.0 lb

## 2023-06-03 DIAGNOSIS — M7989 Other specified soft tissue disorders: Secondary | ICD-10-CM

## 2023-06-14 DIAGNOSIS — M7989 Other specified soft tissue disorders: Secondary | ICD-10-CM

## 2023-06-14 NOTE — Progress Notes (Signed)
 [facility]        PCP: Zacarias, Ashleigh Elizabeth, MD    Chief Complaint   Patient presents with    New Patient     Lipoma of torso- referred by dr zacarias- soft tissue mass on right back,  not painful         HPI: Betty Dudley is a 62 y.o. female who presents in consultation for evaluation of a soft tissue mass on her back.  States she is known about it for maybe about 6 weeks.  Describes it as some mild discomfort with some pressure-like sensation.  Denies any trauma to the area.  States she had a history of a buttocks lipoma status post excision many years ago.    Past Medical History:   Diagnosis Date    Abdominal pain     Asthma     Atelectasis     Colitis     Congenital pectus excavatum     Depression     Fatty liver     High cholesterol     Osteoarthritis     Pelvic pain in female     Sleep apnea     Vaginal atrophy     Venous insufficiency        Past Surgical History:   Procedure Laterality Date    FOOT SURGERY  1978    Cyst remove right foot    LIPOMA RESECTION      TONSILLECTOMY  1980    TUBAL LIGATION           Current Outpatient Medications:     diclofenac  sodium (VOLTAREN ) 1 % GEL, Apply topically 4 times daily as needed for Pain, Disp: , Rfl:     ibuprofen (ADVIL;MOTRIN) 200 MG tablet, Take 1 tablet by mouth every 6 hours as needed for Pain Patient does not know dose, she takes 2 tablets a day., Disp: , Rfl:     Estradiol  (VAGIFEM ) 10 MCG TABS vaginal tablet, Place 1 tablet vaginally Twice a Week, Disp: 24 tablet, Rfl: 3    Azelastine HCl 137 MCG/SPRAY SOLN, , Disp: , Rfl:     EPINEPHrine (EPIPEN) 0.3 MG/0.3ML SOAJ injection, , Disp: , Rfl:     fluticasone  (FLONASE ) 50 MCG/ACT nasal spray, 1 spray by Each Nostril route daily, Disp: 32 g, Rfl: 0    Cetirizine HCl (ZYRTEC ALLERGY) 10 MG CAPS, 1 tablet Orally Once a day for 30 day(s), Disp: , Rfl:     diphenhydrAMINE HCl (BENADRYL ALLERGY PO), PRN, Disp: , Rfl:     albuterol sulfate HFA (PROVENTIL;VENTOLIN;PROAIR)  108 (90 Base) MCG/ACT inhaler, 1 puff as needed Inhalation every 4 hrs, Disp: , Rfl:     budesonide-formoterol (SYMBICORT) 160-4.5 MCG/ACT AERO, 2 puffs Inhalation Twice a day, Disp: , Rfl:      Allergies   Allergen Reactions    Penicillins Anaphylaxis    Diclofenac  Diarrhea       Social History     Socioeconomic History    Marital status: Married     Spouse name: None    Number of children: None    Years of education: None    Highest education level: None   Tobacco Use    Smoking status: Former     Current packs/day: 0.00     Average packs/day: 1 pack/day for 20.0 years (20.0 ttl pk-yrs)     Types: Cigarettes     Start date: 10/05/1986     Quit date:  10/05/2006     Years since quitting: 16.7    Smokeless tobacco: Never    Tobacco comments:     Quit >15 years ago.   Vaping Use    Vaping status: Never Used   Substance and Sexual Activity    Alcohol use: Yes     Alcohol/week: 2.0 standard drinks of alcohol     Types: 1 Glasses of wine, 1 Cans of beer per week     Comment: This is a guess i do not drink every day but occasionally i    Drug use: Never    Sexual activity: Not Currently       Family History   Problem Relation Age of Onset    Alzheimer's Disease Mother     Dementia Father     Breast Cancer Paternal Aunt        Review of Systems   Skin:         Soft tissue mass on the back causing some discomfort and pressure               Objective:  Vitals:    06/03/23 1129   Weight: 108 kg (238 lb)   Height: 1.651 m (5' 5)          Physical Exam  Constitutional:       General: She is not in acute distress.     Appearance: Normal appearance.   HENT:      Head: Normocephalic and atraumatic.      Mouth/Throat:      Mouth: Mucous membranes are moist.      Pharynx: Oropharynx is clear.   Eyes:      General: No scleral icterus.     Conjunctiva/sclera: Conjunctivae normal.   Pulmonary:      Effort: Pulmonary effort is normal. No respiratory distress.      Breath sounds: No wheezing.   Abdominal:      General: There is no  distension.      Palpations: Abdomen is soft. There is no mass.      Tenderness: There is no abdominal tenderness.      Hernia: No hernia is present.   Musculoskeletal:         General: No deformity or signs of injury. Normal range of motion.      Cervical back: Normal range of motion.   Skin:     General: Skin is warm.      Coloration: Skin is not jaundiced.      Findings: No lesion or rash.      Comments: 4 cm fatty mobile soft tissue mass consistent with lipoma right back   Neurological:      Mental Status: She is alert. Mental status is at baseline.           Assessment/Plan:       1. Soft tissue mass     Fatty soft tissue mass feels consistent with lipoma.  Discussed watchful waiting versus operative excision.  States she would like to watch and wait for now.  Very little concern that this represents anything concerning.  We did discuss removal and the procedure in detail.  Will touch base in a few months and see how things are going.        Return in about 3 months (around 09/03/2023).            Morene Abu, MD FACS          NOTE: This report, in part or  full,may have been transcribed using voice recognition software. Every effort was made to ensure accuracy; however, inadvertent computerized transcription errors may be present. Please excuse any transcriptional grammatical or spelling errors that may have escaped my editorial review.

## 2023-06-15 NOTE — Telephone Encounter (Signed)
 Called patient and let her know that provider is in a conference on 07/29/23 and I needed to rs her apt.  I got her rs to 07/27/23 at 840am

## 2023-07-27 ENCOUNTER — Ambulatory Visit
Admit: 2023-07-27 | Discharge: 2023-07-27 | Payer: BLUE CROSS/BLUE SHIELD | Attending: Emergency | Primary: Family Medicine

## 2023-07-27 ENCOUNTER — Encounter

## 2023-07-27 DIAGNOSIS — Z6839 Body mass index (BMI) 39.0-39.9, adult: Secondary | ICD-10-CM

## 2023-07-27 MED ORDER — CONTRAVE 8-90 MG PO TB12
8-90 MG | ORAL_TABLET | ORAL | 0 refills | Status: DC
Start: 2023-07-27 — End: 2023-08-02

## 2023-07-27 NOTE — Assessment & Plan Note (Addendum)
Presumed stable, continue weight loss efforts as outlined below.  Based on early studies, will utilize incretin agonist therapy if an option.

## 2023-07-27 NOTE — Assessment & Plan Note (Addendum)
Obstructive sleep apnea (OSA) left untreated can negatively impact weight and food related behaviors. OSA can also cause cardiac dysrhythmia. Weight loss can improve symptoms of OSA. The patient needs to wear the CPAP mask at least six hours consistently to reduce negative impact on metabolism and weight loss.

## 2023-07-27 NOTE — Assessment & Plan Note (Addendum)
Goal is less than 2000 calories.  High protein and low carb meal plan.  Continue logging meal meals.  Provided handouts.    Discussed risks, benefits and alternatives of Contrave.  Patient meets BMI criteria. she denies MAOI use within the past 14 days, is not on any opioids, and does not have a seizure disorder.    Start Contrave 8/90 mg. Use as directed.     Explained to patient that I will monitor her weight loss every 8 weeks. Goal is to lose at least 5% of her body weight.    Counseled patient on proper use and potential side effects including nausea/vomiting, constipation, headache, dizziness, insomnia, xerostomia, diarrhea, anxiety, increased blood pressure, flushing, fatigue, tremors, irritability, rash and/or palpitations.    Heavily counseled on the importance of therapeutic lifestyle changes through diet and exercise.    Patient advised to report any side effects.     Orders:    naltrexone-buPROPion (CONTRAVE) 8-90 MG per extended release tablet; One tablet orally daily for one week, two tablets orally for week 2, two tablets in the morning and one in the evening, then 2 tabs twice a day

## 2023-07-27 NOTE — Assessment & Plan Note (Addendum)
Limit starch and sugar in diet

## 2023-07-27 NOTE — Progress Notes (Signed)
Betty Dudley (DOB:  1961-05-22) is a 62 y.o. female who presents for follow-up evaluation for weight gain. Patient initially established with medical weight loss 12/23/2022.  Past medical history includes OSA, hepatic steatosis, pre-diabetes, asthma, venous insufficiency, bilateral osteoarthritis of knees, and adult ADD - neuropsych. Last seen 05/2023        Subjective     HPI:    Pt was started on Phentermine however had to stop because it cause anxiety and insomnia. She was taking a full tablet of the phentermine at the time.  She has lost fat mass and gained muscle mass.  She has still done well despite some stress.  She has been logging her meals. She is averaging about 2000-2400 calories.  BP is excellent and controlled.    She is compliant with her CPAP machine every night.  Sleep is reasonable. She is physically active     Current weight loss medication(s): Phentermine   Start date: 03/2023  Weight at medication start:  Weight loss on medication:   Side effect: anxiety    Initial weight: 254.7 pounds  Last visit weight: 237.2 pounds  Current weight: 237 pounds  Weight loss from last visit:  pounds  Total weight loss: -17.5 pounds  Total body weight loss (%): -4.9     Initial BMI: 42.4  Current BMI: Body mass index is 39.42 kg/m.    Prior weight loss medication trials:   Wellbutrin - made her cry without provocation  Never trialed Vvyanse, trialed Ritalin and Adderall     Food diary:  2000 calories per day   []  None   [x]  App - [employer]   []  Handwritten  []  Pictures  []  Other    24-hour food recall:  Breakfast: yogurt, apple doughnuts   Lunch: 3 chicken tenders  Snack: yasso icecream bar  Dinner: chili verde w/ blackbeans, no rice  Snacks: none     Typical day? [x]  Yes   [x]  No     Daily water intake:  []  < 64 oz    [x]  64 oz  [x]  > 64 oz     Other beverages: milk as above    Sugar-sweetened  [x]  Daily  [x]  Weekly  []  Monthly []  Rare   Artificially sweetened  []  Daily  []  Weekly  []  Monthly []   Rare  Carbonated   [x]  Daily  []  Weekly  []  Monthly []  Rare  Caffeinated   []  Daily  []  Weekly  []  Monthly []  Rare    Alcohol    []  Daily  []  Weekly  []  Monthly []  Rare  []  None    Physical activity: []  None [x]  Structured []  Unstructured  Frequency per week: []  1 []  2 []  3 []  4 []  5 []  6 []  7   Type: [x]  Cardio []  Strength []  Group Classes []  Online Videos - walking   Length in minutes: []  10 [x]  20 []  30 []  40 []  50 []  60 []  60+    Sleep hours (at least 6 hours per night):  [x]  Adequate []  Inadequate   Wake rested in the morning:  []  Yes  []  No  [x]  Sometimes - improved   Uses CPAP: [] N/A [x]  Nightly []  Not wearing CPAP consistently []  Unable to tolerate  Medication use: [x] N/A []  Nightly []  Every other night []  <50% of nights      Current Outpatient Medications on File Prior to Visit   Medication Sig Dispense Refill    diclofenac sodium (VOLTAREN)  1 % GEL Apply topically 4 times daily as needed for Pain      ibuprofen (ADVIL;MOTRIN) 200 MG tablet Take 1 tablet by mouth every 6 hours as needed for Pain Patient does not know dose, she takes 2 tablets a day.      Estradiol (VAGIFEM) 10 MCG TABS vaginal tablet Place 1 tablet vaginally Twice a Week 24 tablet 3    Azelastine HCl 137 MCG/SPRAY SOLN       EPINEPHrine (EPIPEN) 0.3 MG/0.3ML SOAJ injection       fluticasone (FLONASE) 50 MCG/ACT nasal spray 1 spray by Each Nostril route daily 32 g 0    Cetirizine HCl (ZYRTEC ALLERGY) 10 MG CAPS 1 tablet Orally Once a day for 30 day(s)      diphenhydrAMINE HCl (BENADRYL ALLERGY PO) PRN      albuterol sulfate HFA (PROVENTIL;VENTOLIN;PROAIR) 108 (90 Base) MCG/ACT inhaler 1 puff as needed Inhalation every 4 hrs      budesonide-formoterol (SYMBICORT) 160-4.5 MCG/ACT AERO 2 puffs Inhalation Twice a day       No current facility-administered medications on file prior to visit.        Allergies   Allergen Reactions    Penicillins Anaphylaxis    Diclofenac Diarrhea        Past Medical History:   Diagnosis Date    Abdominal pain      Asthma     Atelectasis     Colitis     Congenital pectus excavatum     Depression     Fatty liver     High cholesterol     Osteoarthritis     Pelvic pain in female     Sleep apnea     Vaginal atrophy     Venous insufficiency         Past Surgical History:   Procedure Laterality Date    FOOT SURGERY  1978    Cyst remove right foot    LIPOMA RESECTION      TONSILLECTOMY  1980    TUBAL LIGATION          Review of Systems   Constitutional:  Negative for fatigue.   HENT:  Negative for trouble swallowing.    Respiratory:  Negative for shortness of breath.    Cardiovascular:  Negative for chest pain and palpitations.   Gastrointestinal:  Negative for abdominal pain, constipation, diarrhea, nausea and vomiting.   Genitourinary:         Admits waking up at night to urinate    Musculoskeletal:  Negative for arthralgias.   Neurological:  Negative for dizziness, seizures and headaches.   Psychiatric/Behavioral:  The patient is nervous/anxious.    All other systems reviewed and are negative.                  Objective     BP 127/80   Pulse 90   Temp 98.1 F (36.7 C)   Ht 1.651 m (5\' 5" )   Wt 107.5 kg (236 lb 14.4 oz)   LMP  (LMP Unknown)   SpO2 97%   BMI 39.42 kg/m         07/27/2023     8:56 AM 07/27/2023     8:55 AM   Weight Metrics   Weight  236 lb 14.4 oz   Waist Measure Inches 52 in    BMI (Calculated)  39.5 kg/m2            07/27/2023     8:56 AM  06/02/2023     9:47 AM   Medical Scale Data   Initial Height 5\' 5"  5\' 5"    Initial Weight 254 lb 11.8 oz 254 lb 11.8 oz   Weight 236 lb 14.2 oz 237 lb 3.5 oz   Total Weight Change -17.85 -17.48   BMI 39.42 39.47   Initial Body Fat % 49.34 49.34   Body Fat % 45.02 46.37   Total Body Fat% Change -4.32 -2.97   Fat Mass (lbs) 106.64 110   Fat Free Mass (lbs) 130.24 127.21   Water (lbs) 98.47 96.38   Waist (Inches) 52 52   Visceral Adipose Tissue (Index) 6.14 6.29   Protein Mass 31.77 30.83          Physical Exam:  General: alert, oriented, no acute distress  HEENT: no scleral  icterus, EOMI grossly intact  Cardio: regular rate rhythm, no murmurs  Resp: clear to auscultation bilaterally  Extremities: trace pitting edema bilateral LE  Psych: mood-congruent affect, goal-oriented thought process      CMP:   Lab Results   Component Value Date    NA 143 09/30/2022    K 4.3 09/30/2022    CL 106 09/30/2022    CO2 23 01/09/2021    BUN 17 09/30/2022    CREATININE 0.89 03/16/2023    GLUCOSE 103 (H) 09/30/2022    CALCIUM 9.0 09/30/2022    BILITOT 0.5 09/30/2022    ALKPHOS 62 09/30/2022    AST 20 03/16/2023    ALT 27 03/16/2023    LABGLOM 83 09/30/2022    GFRAA 110 01/09/2021    AGRATIO 2.4 (H) 09/30/2022    GLOB 2.0 01/09/2021     CBC:   Lab Results   Component Value Date    WBC 6.7 09/30/2022    HGB 13.2 09/30/2022    HCT 39.0 09/30/2022    MCV 92 09/30/2022    PLT 308 01/09/2021    RBC 4.24 09/30/2022    MCH 31.1 09/30/2022    MCHC 33.8 09/30/2022    RDW 11.5 (L) 09/30/2022       Hemoglobin A1c:   Lab Results   Component Value Date    LABA1C 5.9 03/16/2023    LABA1C 5.9 (H) 09/30/2022     Total insulin: No results found for: "LABINSU"  Thyroid: No results found for: "TSHFT4", "TSH", "TSH3GEN"  Lipids:   Lab Results   Component Value Date    CHOL 169 03/16/2023    CHOL 169 09/30/2022     Lab Results   Component Value Date    TRIG 114 03/16/2023    TRIG 142 09/30/2022     Lab Results   Component Value Date    HDL 51 03/16/2023    HDL 49 09/30/2022     No components found for: "LDLCHOLESTEROL", "LDLCALC"  Lab Results   Component Value Date    VLDL 25 09/30/2022    VLDL 56.2 01/09/2021     Lab Results   Component Value Date    CHOLHDLRATIO 3.4 09/30/2022    CHOLHDLRATIO 4.1 01/09/2021        Vitamin D: No results found for: "VITD25"     Vitamin B12: No results found for: "VITAMINB12"     Folate:  No results found for: "FOLATE"  Iron:    Lab Results   Component Value Date/Time    IRON 102 09/30/2022 07:24 AM     Iron Saturation:  No components found for: "PERCENTFE"  TIBC:  No results  found for:  "TIBC"  Ferritin:  No results found for: "FERRITIN"    Assessment & Plan     Assessment & Plan  Class 2 obesity with body mass index (BMI) of 39.0 to 39.9 in adult, unspecified obesity type, unspecified whether serious comorbidity present     Goal is less than 2000 calories.  High protein and low carb meal plan.  Continue logging meal meals.  Provided handouts.    Discussed risks, benefits and alternatives of Contrave.  Patient meets BMI criteria. she denies MAOI use within the past 14 days, is not on any opioids, and does not have a seizure disorder.    Start Contrave 8/90 mg. Use as directed.     Explained to patient that I will monitor her weight loss every 8 weeks. Goal is to lose at least 5% of her body weight.    Counseled patient on proper use and potential side effects including nausea/vomiting, constipation, headache, dizziness, insomnia, xerostomia, diarrhea, anxiety, increased blood pressure, flushing, fatigue, tremors, irritability, rash and/or palpitations.    Heavily counseled on the importance of therapeutic lifestyle changes through diet and exercise.    Patient advised to report any side effects.     Orders:    naltrexone-buPROPion (CONTRAVE) 8-90 MG per extended release tablet; One tablet orally daily for one week, two tablets orally for week 2, two tablets in the morning and one in the evening, then 2 tabs twice a day    Obstructive sleep apnea     Obstructive sleep apnea (OSA) left untreated can negatively impact weight and food related behaviors. OSA can also cause cardiac dysrhythmia. Weight loss can improve symptoms of OSA. The patient needs to wear the CPAP mask at least six hours consistently to reduce negative impact on metabolism and weight loss.         Pre-diabetes     Limit starch and sugar in diet        Fatty liver     Presumed stable, continue weight loss efforts as outlined below.  Based on early studies, will utilize incretin agonist therapy if an option.                 Return in  about 8 weeks (around 09/21/2023).       On 07/27/23 I spent 30-39 mins  reviewing any available diagnostic tests, obtained history, previous visit notes, and interpreting any test results or imaging as necessary in preparation for this patient visit. Any necessary examinations and/or evaluations were performed during this visit. Any necessary medications, tests and/or procedures were also ordered. Time was spent in coordination of care and/or counseling and educating the patient and any present family Adult nurse. The education included counseling on medication use, side effects, treatment goals, proper way to take medications, symptom management with nonprescription options and the pathophysiology of the condition as appropriate. Education was also given regarding the disease process and any medications prescribed as appropriate. Plan of care was reviewed with the patient and the patient agreed with that plan. All questions were answered. Total time spent also includes documenting clinical information in the electronic record prior to 11:59 PM on the date of service.      An electronic signature was used to authenticate this note.    --Clemetine Marker, APRN - NP

## 2023-08-02 MED ORDER — CONTRAVE 8-90 MG PO TB12
8-90 MG | ORAL_TABLET | ORAL | 0 refills | Status: AC
Start: 2023-08-02 — End: 2023-08-12

## 2023-08-03 ENCOUNTER — Telehealth

## 2023-08-03 NOTE — Telephone Encounter (Signed)
Patient called and stated Contrave is over $300 at [facility]. Are we able to send this to [facility] pharmacy for a better price? She is aware of this alternative. Patient will wait for the order to be put in, thank you.

## 2023-08-06 ENCOUNTER — Encounter: Attending: Surgery | Primary: Family Medicine

## 2023-08-09 NOTE — Telephone Encounter (Signed)
Patient calling for an update- rx has not been sent to [facility] pharmacy yet and patient runs out of samples today or tomorrow. Thank you.

## 2023-08-12 MED ORDER — CONTRAVE 8-90 MG PO TB12
8-90 MG | ORAL_TABLET | ORAL | 0 refills | Status: AC
Start: 2023-08-12 — End: 2023-08-27

## 2023-08-12 NOTE — Telephone Encounter (Signed)
Sent to [facility]

## 2023-08-25 ENCOUNTER — Encounter: Payer: Self-pay | Admitting: Internal Medicine

## 2023-08-26 ENCOUNTER — Encounter: Admit: 2023-08-26 | Admitting: Emergency

## 2023-08-26 DIAGNOSIS — E66812 Obesity, class 2: Secondary | ICD-10-CM

## 2023-08-26 DIAGNOSIS — Z6839 Body mass index (BMI) 39.0-39.9, adult: Secondary | ICD-10-CM

## 2023-08-27 MED ORDER — CONTRAVE 8-90 MG PO TB12
8-90 MG | ORAL_TABLET | ORAL | 1 refills | Status: DC
Start: 2023-08-27 — End: 2023-09-17

## 2023-08-28 ENCOUNTER — Encounter

## 2023-08-30 NOTE — Telephone Encounter (Signed)
 Pt called to say that she is having severe stomach pain and constipation since taking Contrave. She stopped taking the medication over a week ago but wants to know if this is a normal reaction to the medication. Should she restart or remain off the med.

## 2023-09-06 NOTE — Telephone Encounter (Signed)
 Patient called and lm on vm at 1255 needing some help.  She has been on contrave for a while and is up to 2. She said she has started getting constipated and stopped.  She is considering restarting but wanted some advise.  Please call

## 2023-09-09 NOTE — Telephone Encounter (Signed)
 Duplicate message.

## 2023-09-11 ENCOUNTER — Encounter: Admit: 2023-09-11 | Admitting: Emergency

## 2023-09-11 DIAGNOSIS — E66812 Obesity, class 2: Secondary | ICD-10-CM

## 2023-09-11 DIAGNOSIS — Z6839 Body mass index (BMI) 39.0-39.9, adult: Secondary | ICD-10-CM

## 2023-09-16 ENCOUNTER — Encounter

## 2023-09-17 MED ORDER — CONTRAVE 8-90 MG PO TB12
8-90 | ORAL_TABLET | ORAL | 0 refills | 30.00000 days | Status: DC
Start: 2023-09-17 — End: 2024-03-28

## 2023-09-21 ENCOUNTER — Encounter: Attending: Emergency | Primary: Family Medicine

## 2023-09-24 NOTE — Telephone Encounter (Signed)
 Patient called and lm om vm needing to rs her apt w/andrea.  I called pt to get her rs and had to lm on vm for her to call back

## 2023-11-22 ENCOUNTER — Ambulatory Visit
Admit: 2023-11-22 | Discharge: 2023-11-22 | Payer: BLUE CROSS/BLUE SHIELD | Attending: Family Medicine | Primary: Family Medicine

## 2023-11-22 DIAGNOSIS — E785 Hyperlipidemia, unspecified: Secondary | ICD-10-CM

## 2023-11-22 MED ORDER — SEMAGLUTIDE-WEIGHT MANAGEMENT 0.25 MG/0.5ML SC SOAJ
0.25 | SUBCUTANEOUS | 0 refills | 84.00000 days | Status: DC
Start: 2023-11-22 — End: 2024-03-28

## 2023-11-22 NOTE — Progress Notes (Signed)
 CHIEF COMPLAINT:  Chief Complaint   Patient presents with    Follow-up     Last A1c-5.9 on 03/16/23            HISTORY OF PRESENT ILLNESS:  Betty Dudley is a 63 y.o. female  who presents in follow-up.   She has been doing ok since our last visit.   HPL- borderline on last labs  Prediabetes- has been stable.   Obesity- seeing obesity medicine. Noted she is not really taking the pills as they made her VERY constipated. She noted that she was not very body conscience but notes she was very constipated all of the sudden with the CONTRAVE.  She did not tolerate phentermine due to anxiety and insomnia. She is not sure what pill seemed to help.   Prehypertensive- noted stable on today.   Depression- she does feel she has some depression and felt contrave was helping her and she noted that it seemed like part of it was helping her to not be as upset about things. She feels that she is lethargic but not sure it is actually depression or not. Her youngest dtr has been in and out of their house as she has some mental issues and they are trying to help her with this.   Asthma- feels flaring, sees Dr Franky Macho  PHQ:      11/22/2023    11:39 AM   PHQ-9    Little interest or pleasure in doing things 1   Feeling down, depressed, or hopeless 1   Trouble falling or staying asleep, or sleeping too much 3   Feeling tired or having little energy 3   Poor appetite or overeating 3   Feeling bad about yourself - or that you are a failure or have let yourself or your family down 2   Trouble concentrating on things, such as reading the newspaper or watching television 1   Moving or speaking so slowly that other people could have noticed. Or the opposite - being so fidgety or restless that you have been moving around a lot more than usual 0   Thoughts that you would be better off dead, or of hurting yourself in some way 0   PHQ-2 Score 2   PHQ-9 Total Score 14   If you checked off any problems, how difficult have these problems made it for you  to do your work, take care of things at home, or get along with other people? 1       CURRENT MEDICATION LIST:    Current Outpatient Medications   Medication Sig Dispense Refill    Semaglutide-Weight Management (WEGOVY) 0.25 MG/0.5ML SOAJ SC injection Inject 0.25 mg into the skin every 7 days 2 mL 0    naltrexone-buPROPion (CONTRAVE) 8-90 MG per extended release tablet Starting month: 1 TAB DAILY 7 Days, THEN 1 TWICE DAILY 7 Days, THEN 2 in the AM AND 1 in the PM 7 Days THEN your maintenance dose is 2 TWICE DAILY 70 tablet 0    diclofenac sodium (VOLTAREN) 1 % GEL Apply topically 4 times daily as needed for Pain      ibuprofen (ADVIL;MOTRIN) 200 MG tablet Take 1 tablet by mouth every 6 hours as needed for Pain Patient does not know dose, she takes 2 tablets a day.      Estradiol (VAGIFEM) 10 MCG TABS vaginal tablet Place 1 tablet vaginally Twice a Week 24 tablet 3    Azelastine HCl 137 MCG/SPRAY SOLN  EPINEPHrine (EPIPEN) 0.3 MG/0.3ML SOAJ injection       fluticasone (FLONASE) 50 MCG/ACT nasal spray 1 spray by Each Nostril route daily 32 g 0    Cetirizine HCl (ZYRTEC ALLERGY) 10 MG CAPS 1 tablet Orally Once a day for 30 day(s)      diphenhydrAMINE HCl (BENADRYL ALLERGY PO) PRN      albuterol sulfate HFA (PROVENTIL;VENTOLIN;PROAIR) 108 (90 Base) MCG/ACT inhaler 1 puff as needed Inhalation every 4 hrs      budesonide-formoterol (SYMBICORT) 160-4.5 MCG/ACT AERO 2 puffs Inhalation Twice a day       No current facility-administered medications for this visit.        ALLERGIES:    Allergies   Allergen Reactions    Penicillins Anaphylaxis    Diclofenac Diarrhea        HISTORY:  Past Medical History:   Diagnosis Date    Abdominal pain     Asthma     Atelectasis     Colitis     Congenital pectus excavatum     Depression     Fatty liver     High cholesterol     Osteoarthritis     Pelvic pain in female     Sleep apnea     Vaginal atrophy     Venous insufficiency       Past Surgical History:   Procedure Laterality Date     FOOT SURGERY  1978    Cyst remove right foot    LIPOMA RESECTION      TONSILLECTOMY  1980    TUBAL LIGATION        Social History     Socioeconomic History    Marital status: Married     Spouse name: Not on file    Number of children: Not on file    Years of education: Not on file    Highest education level: Not on file   Occupational History    Not on file   Tobacco Use    Smoking status: Former     Current packs/day: 0.00     Average packs/day: 1 pack/day for 20.0 years (20.0 ttl pk-yrs)     Types: Cigarettes     Start date: 10/05/1986     Quit date: 10/05/2006     Years since quitting: 17.1    Smokeless tobacco: Never    Tobacco comments:     Quit >15 years ago.   Vaping Use    Vaping status: Never Used   Substance and Sexual Activity    Alcohol use: Yes     Alcohol/week: 2.0 standard drinks of alcohol     Types: 1 Glasses of wine, 1 Cans of beer per week     Comment: This is a guess i do not drink every day but occasionally i    Drug use: Never    Sexual activity: Not Currently   Other Topics Concern    Not on file   Social History Narrative    Not on file     Social Drivers of Health     Financial Resource Strain: Not on file   Food Insecurity: Not on file   Transportation Needs: Not on file   Physical Activity: Not on file   Stress: Not on file   Social Connections: Not on file   Intimate Partner Violence: Not on file   Housing Stability: Not on file      Family History   Problem Relation Age of Onset  Alzheimer's Disease Mother     Dementia Father     Breast Cancer Paternal Aunt         Review of Systems   Constitutional: Negative.    HENT: Negative.     Eyes: Negative.    Respiratory: Negative.     Cardiovascular: Negative.    Gastrointestinal: Negative.    Endocrine: Negative.    Genitourinary: Negative.    Musculoskeletal: Negative.    Skin: Negative.    Allergic/Immunologic: Negative.    Neurological: Negative.    Hematological: Negative.    Psychiatric/Behavioral: Negative.          PHYSICAL EXAM:  Vital  Signs -   Visit Vitals  BP 120/70   Pulse 66   Temp 97.5 F (36.4 C)   Resp 16   Wt 108.1 kg (238 lb 6.4 oz)   SpO2 96%   BMI 39.67 kg/m        Physical Exam  Vitals and nursing note reviewed.   Constitutional:       General: She is not in acute distress.     Appearance: Normal appearance. She is obese.   HENT:      Head: Normocephalic and atraumatic.      Mouth/Throat:      Mouth: Mucous membranes are moist.      Pharynx: Oropharynx is clear. No oropharyngeal exudate or posterior oropharyngeal erythema.   Eyes:      Extraocular Movements: Extraocular movements intact.      Conjunctiva/sclera: Conjunctivae normal.      Pupils: Pupils are equal, round, and reactive to light.   Neck:      Vascular: No carotid bruit.   Cardiovascular:      Rate and Rhythm: Normal rate and regular rhythm.      Heart sounds: Normal heart sounds. No murmur heard.  Pulmonary:      Effort: Pulmonary effort is normal. No respiratory distress.      Breath sounds: Normal breath sounds.   Abdominal:      General: Bowel sounds are normal. There is no distension.      Palpations: Abdomen is soft.      Tenderness: There is no abdominal tenderness.   Musculoskeletal:         General: No swelling or tenderness. Normal range of motion.      Cervical back: Normal range of motion and neck supple.      Right lower leg: Edema (trace) present.      Left lower leg: Edema (trace) present.   Lymphadenopathy:      Cervical: No cervical adenopathy.   Skin:     General: Skin is warm and dry.      Findings: No erythema or rash.   Neurological:      General: No focal deficit present.      Mental Status: She is alert and oriented to person, place, and time. Mental status is at baseline.   Psychiatric:         Mood and Affect: Mood normal.         Behavior: Behavior normal.         Thought Content: Thought content normal.         Judgment: Judgment normal.          LABS  No results found for this visit on 11/22/23.  Office Visit on 05/18/2023   Component Date  Value Ref Range Status    Creatinine 03/16/2023 0.89  mg/dL Final  AST 03/16/2023 20  U/L Final    ALT 03/16/2023 27  U/L Final    Cholesterol, Total 03/16/2023 169  mg/dL Final    HDL 40/98/1191 51  35 - 70 mg/dL Final    LDL Cholesterol 03/16/2023 98   Final    Triglycerides 03/16/2023 114  mg/dL Final    Hemoglobin Y7W 03/16/2023 5.9  % Final       IMPRESSION/PLAN    1. Borderline hyperlipidemia  Cont to monitor with diet and exercise    2. Obesity (BMI 30-39.9)  Will trial wegovy as was denied zepbound last year and see if we can appeal this decision as failed contrave, phentermine, metformin. Also pre-hypertensive, prediabetic and HPL.   For now, will trial a lower dose of contrave and will see how this does. Could consider using wellbutrin alone if wegovy approved as also treating depression.   - Semaglutide-Weight Management (WEGOVY) 0.25 MG/0.5ML SOAJ SC injection; Inject 0.25 mg into the skin every 7 days  Dispense: 2 mL; Refill: 0    3. Prediabetes  Cont to monitor carb and sugar intake    4. Mild recurrent major depression  Stable, cont to monitor    5. Encounter for screening mammogram for malignant neoplasm of breast  - MAM DIGITAL SCREEN W OR WO CAD BILATERAL; Future    6. Mild intermittent asthma, unspecified whether complicated  Stable on current med    7. Need for influenza vaccination  - Influenza, FLUCELVAX Trivalent, (age 82 mo+) IM, Preservative Free, 0.24mL   Will trial wegovy as was denied zepbound last year and see if we can appeal this decision as failed contrave, phentermine, metformin. Also pre-hypertensive, prediabetic and HPL.   For now, will trial a lower dose of contrave and will see how this does. Could consider using wellbutrin alone if wegovy approved as also treating depression.     Follow up and Dispositions:  Return for 4 months .       Derek Mound, MD

## 2024-01-05 NOTE — Telephone Encounter (Signed)
 Patient requesting refill on Estradiol 10 mcg tabs   Pharmacy verified - [facility] [address]   Informed of 24-48 hour turn around time

## 2024-01-06 ENCOUNTER — Encounter

## 2024-01-06 MED ORDER — ESTRADIOL 10 MCG VA TABS
10 MCG | ORAL_TABLET | VAGINAL | 1 refills | Status: DC
Start: 2024-01-06 — End: 2024-01-14

## 2024-01-06 NOTE — Telephone Encounter (Signed)
 Spoke to pt/ scheduled annual for 6/17 w/ Melonie [place]

## 2024-01-06 NOTE — Progress Notes (Signed)
 Med refill request

## 2024-01-13 NOTE — Telephone Encounter (Signed)
 Patient states her insurance is not accepted at the [facility] and she needs the Estradiol sent to the [facility] at [address]

## 2024-01-14 ENCOUNTER — Encounter

## 2024-01-14 MED ORDER — ESTRADIOL 10 MCG VA TABS
10 MCG | ORAL_TABLET | VAGINAL | 1 refills | Status: DC
Start: 2024-01-14 — End: 2024-03-21

## 2024-01-14 NOTE — Progress Notes (Signed)
 Requesting refill of vaginal estrogen.

## 2024-02-22 ENCOUNTER — Inpatient Hospital Stay: Admit: 2024-02-22 | Payer: BLUE CROSS/BLUE SHIELD | Attending: Family Medicine | Primary: Family Medicine

## 2024-02-22 DIAGNOSIS — Z1231 Encounter for screening mammogram for malignant neoplasm of breast: Secondary | ICD-10-CM

## 2024-03-13 NOTE — Telephone Encounter (Signed)
 Patient is requesting for her blood work orders to be sent over to [facility]: [place], [place] [address]  Phone: [phone]  Fax: [phone]    Please assist

## 2024-03-14 ENCOUNTER — Encounter

## 2024-03-14 NOTE — Telephone Encounter (Signed)
 I have faxed the labs from today to [facility] at [phone] per patient request.

## 2024-03-14 NOTE — Telephone Encounter (Signed)
 Spoke with patient and informed her the lab orders have been faxed to [facility]

## 2024-03-18 LAB — CMP12+8AC
ALT: 23 IU/L (ref 0–32)
AST: 19 IU/L (ref 0–40)
Albumin: 4.4 g/dL (ref 3.9–4.9)
Alkaline Phosphatase: 67 IU/L (ref 44–121)
BUN/Creatinine Ratio: 27 (ref 12–28)
BUN: 23 mg/dL (ref 8–27)
Calcium: 9.5 mg/dL (ref 8.7–10.3)
Chloride: 101 mmol/L (ref 96–106)
Cholesterol, Total: 211 mg/dL — ABNORMAL HIGH (ref 100–199)
Creatinine: 0.84 mg/dL (ref 0.57–1.00)
Est, Glom Filt Rate: 79 mL/min/{1.73_m2} (ref >59)
GGT: 23 IU/L (ref 0–60)
Globulin, Total: 2.2 g/dL (ref 1.5–4.5)
Glucose: 96 mg/dL (ref 70–99)
Iron: 155 ug/dL — ABNORMAL HIGH (ref 27–139)
LD: 182 IU/L (ref 119–226)
Phosphorus: 4.1 mg/dL (ref 3.0–4.3)
Potassium: 4.8 mmol/L (ref 3.5–5.2)
Sodium: 138 mmol/L (ref 134–144)
Total Bilirubin: 0.8 mg/dL (ref 0.0–1.2)
Total Protein: 6.6 g/dL (ref 6.0–8.5)
Triglycerides: 207 mg/dL — ABNORMAL HIGH (ref 0–149)
Uric Acid: 6.9 mg/dL (ref 3.0–7.2)

## 2024-03-18 LAB — LIPID PANEL W/ CHOL/HDL RATIO
Chol/HDL Ratio: 4.1 ratio (ref 0.0–4.4)
HDL: 52 mg/dL (ref >39)
LDL Cholesterol: 123 mg/dL — ABNORMAL HIGH (ref 0–99)
VLDL Cholesterol Calculated: 36 mg/dL (ref 5–40)

## 2024-03-18 LAB — VITAMIN D 25 HYDROXY: Vit D, 25-Hydroxy: 29.6 ng/mL — ABNORMAL LOW (ref 30.0–100.0)

## 2024-03-18 LAB — TSH REFLEX TO FT4: TSH: 2.51 u[IU]/mL (ref 0.450–4.500)

## 2024-03-18 LAB — HEMOGLOBIN A1C W/EAG
Estimated Avg Glucose: 123 mg/dL
Hemoglobin A1C: 5.9 % — ABNORMAL HIGH (ref 4.8–5.6)

## 2024-03-21 ENCOUNTER — Ambulatory Visit: Admit: 2024-03-21 | Discharge: 2024-03-21 | Payer: BLUE CROSS/BLUE SHIELD | Primary: Family Medicine

## 2024-03-21 VITALS — BP 134/82 | Ht 65.0 in | Wt 248.0 lb

## 2024-03-21 DIAGNOSIS — Z01419 Encounter for gynecological examination (general) (routine) without abnormal findings: Secondary | ICD-10-CM

## 2024-03-21 MED ORDER — CLOBETASOL PROPIONATE 0.05 % EX OINT
0.05 | CUTANEOUS | 3 refills | 30.00000 days | Status: AC
Start: 2024-03-21 — End: ?

## 2024-03-21 MED ORDER — ESTRADIOL 10 MCG VA TABS
10 | ORAL_TABLET | VAGINAL | 3 refills | 84.00000 days | Status: AC
Start: 2024-03-21 — End: ?

## 2024-03-21 NOTE — Progress Notes (Signed)
 CC: Patient presents today for Annual Exam      HPI: see assessment and plan      OB History       Gravida   2    Para   2    Term   2    Preterm        AB        Living   2         SAB        IAB        Ectopic        Molar        Multiple        Live Births   2                GYN History     GYN History      Last Pap result:  Diagnosis:   Date Value Ref Range Status   01/14/2021 Comment  Final     Comment:     NEGATIVE FOR INTRAEPITHELIAL LESION OR MALIGNANCY.  REACTIVE CELLULAR CHANGES NOTED.       Specimen adequacy:   Date Value Ref Range Status   01/14/2021 Comment  Final     Comment:     Satisfactory for evaluation. Endocervical and/or squamous metaplastic  cells (endocervical component) are present.       Methodology:   Date Value Ref Range Status   01/14/2021 Comment  Final     Comment:     This liquid based ThinPrep(R) pap test was screened with the  use of an image guided system.           Past Medical History:  Past Medical History:   Diagnosis Date    Abdominal pain     Asthma     Atelectasis     Colitis     Congenital pectus excavatum     Depression     Fatty liver     High cholesterol     Osteoarthritis     Pelvic pain in female     Sleep apnea     Vaginal atrophy     Venous insufficiency        Past Surgical History:  Past Surgical History:   Procedure Laterality Date    FOOT SURGERY  1978    Cyst remove right foot    LIPOMA RESECTION      TONSILLECTOMY  1980    TUBAL LIGATION         Allergies:   Allergies   Allergen Reactions    Penicillins Anaphylaxis    Diclofenac  Diarrhea       Medication History:  Current Outpatient Medications   Medication Sig Dispense Refill    AIRSUPRA 90-80 MCG/ACT AERO INHALE 2 PUFFS BY MOUTH EVERY 3 TO 4 HOURS FOR COUGH OR WHEEZING OR SHORTNESS OF BREATH      [START ON 03/23/2024] Estradiol  (VAGIFEM ) 10 MCG TABS vaginal tablet Place 1 tablet vaginally Twice a Week 24 tablet 3    clobetasol (TEMOVATE) 0.05 % ointment Apply topically tid for 7days, then bid for 7days, then qd  for 7 days and then discontinue 45 g 3    diclofenac  sodium (VOLTAREN ) 1 % GEL Apply topically 4 times daily as needed for Pain      ibuprofen (ADVIL;MOTRIN) 200 MG tablet Take 1 tablet by mouth every 6 hours as needed for Pain Patient does not know dose, she takes 2 tablets a day.  Cetirizine HCl (ZYRTEC ALLERGY) 10 MG CAPS 1 tablet Orally Once a day for 30 day(s)      diphenhydrAMINE HCl (BENADRYL ALLERGY PO) PRN      budesonide-formoterol (SYMBICORT) 160-4.5 MCG/ACT AERO 2 puffs Inhalation Twice a day      Semaglutide -Weight Management (WEGOVY ) 0.25 MG/0.5ML SOAJ SC injection Inject 0.25 mg into the skin every 7 days (Patient not taking: Reported on 03/21/2024) 2 mL 0    naltrexone-buPROPion (CONTRAVE ) 8-90 MG per extended release tablet Starting month: 1 TAB DAILY 7 Days, THEN 1 TWICE DAILY 7 Days, THEN 2 in the AM AND 1 in the PM 7 Days THEN your maintenance dose is 2 TWICE DAILY 70 tablet 0    Azelastine HCl 137 MCG/SPRAY SOLN       EPINEPHrine (EPIPEN) 0.3 MG/0.3ML SOAJ injection       fluticasone  (FLONASE ) 50 MCG/ACT nasal spray 1 spray by Each Nostril route daily 32 g 0    albuterol sulfate HFA (PROVENTIL;VENTOLIN;PROAIR) 108 (90 Base) MCG/ACT inhaler 1 puff as needed Inhalation every 4 hrs (Patient not taking: Reported on 03/21/2024)       No current facility-administered medications for this visit.       Social History:  Social History     Socioeconomic History    Marital status: Married     Spouse name: Not on file    Number of children: Not on file    Years of education: Not on file    Highest education level: Not on file   Occupational History    Not on file   Tobacco Use    Smoking status: Former     Current packs/day: 0.00     Average packs/day: 1 pack/day for 20.0 years (20.0 ttl pk-yrs)     Types: Cigarettes     Start date: 10/05/1986     Quit date: 10/05/2006     Years since quitting: 17.4    Smokeless tobacco: Never    Tobacco comments:     Quit >15 years ago.   Vaping Use    Vaping status: Never  Used   Substance and Sexual Activity    Alcohol use: Yes     Alcohol/week: 2.0 standard drinks of alcohol     Types: 1 Glasses of wine, 1 Cans of beer per week     Comment: This is a guess i do not drink every day but occasionally i    Drug use: Never    Sexual activity: Not Currently   Other Topics Concern    Not on file   Social History Narrative    Not on file     Social Drivers of Health     Financial Resource Strain: Not on file   Food Insecurity: Not on file   Transportation Needs: Not on file   Physical Activity: Not on file   Stress: Not on file   Social Connections: Not on file   Intimate Partner Violence: Not on file   Housing Stability: Not on file       Family History:  Family History   Problem Relation Age of Onset    Alzheimer's Disease Mother     Dementia Father     Breast Cancer Paternal Aunt        Objective:   BP 134/82 (BP Site: Left Upper Arm)   Ht 1.651 m (5' 5)   Wt 112.5 kg (248 lb)   LMP  (LMP Unknown)   BMI 41.27 kg/m  The patient appears well, alert, oriented x 3, in no distress.    PHYSICAL EXAM:    General Examination:  GENERAL APPEARANCE: alert, well hydrated, in no distress.   HEAD: normocephalic, atraumatic.   EYES: no lid lag, stare or proptosis, extraocular movement full and smooth.   NECK: thyroid normal.   RESPIRATORY: unlabored.   ABDOMEN: normal, soft, nontender, nondistended.   SKIN: normal, no suspicious lesions.   EXTREMITIES: no edema, normal, full range of motion.   NEUROLOGIC: alert and oriented, gait normal.         PELVIC EXAM:   Examination:   Gynecological:  EXTERNAL GENITALIA: normal, introitus normal.   URETHRA: meatus normal.   BLADDER: normal.   VAGINA: healthy pink mucosa without any lesions.   CERVIX: no cervical movement tenderness, no lesions.   UTERUS: normal mobility, nontender.   ADNEXA: no mass, nontender.   ANUS/PERINEUM: normal.                Assessment/Plan:   1. Encounter for gynecological examination  2. Cervical cancer screening  -     PAP  IG, Aptima HPV and rfx 16/18,45 (161096)  ([facility] DEFAULT)  3. Screening for human papillomavirus (HPV)  -     PAP IG, Aptima HPV and rfx 16/18,45 (045409)  ([facility] DEFAULT)  4. Weight loss counseling, encounter for  5. Colon cancer screening  6. Breast cancer screening by mammogram  7. Vaginal atrophy  -     Estradiol  (VAGIFEM ) 10 MCG TABS vaginal tablet; Place 1 tablet vaginally Twice a Week, Disp-24 tablet, R-3Normal  -     clobetasol (TEMOVATE) 0.05 % ointment; Apply topically tid for 7days, then bid for 7days, then qd for 7 days and then discontinue, Disp-45 g, R-3, Normal  8. BMI 40.0-44.9, adult (HCC)     03/21/2024: established patient, annual visit, 63 y/o female P2  --cervical cancer screening: normal history, negative cytology 2022, pap guidelines reviewed. Pap with cytology done.  --denies PMB  --vaginal atrophy, will refill vagifem . She is experiencing some mild irritation and dryness. Reviewed all options and will start on clobetasol taper. Will see if this provides some benefit. Will have her reach out in three months if symptoms persist and may consider switching to imvexxy  or alternative medication.  --weight loss counseling, history of following up with bariatric surgery in the past but does not desire to have surgery. Reviewed options including self pay compounding GLP-1 medication. She has an upcoming PCP appointment and recommend considering zepbound for sleep apnea.  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  - Follow-up 1 year annual  _______________________________________________________________________    12/04/2018 for Cunanan is a 63 year old P2  - Cervical screening: Normal history, negative cytology 2022 and Pap deferred today  - Denies PMB  - Vaginal atrophy: Will  refill Vagifem  which is working well for her  - Rare SUI, not requiring treatment  - Health maintenance: Colonoscopy 2019, recent mammogram done in the importance of monthly SBE recommended, healthy lifestyle with exercise and diet recommended  - Social: Married, 3 grandkids, lives in [place]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  Manette is  a 63 year old postmenopausal patient  - Medication refill, I had previously had her on Estrace  cream due to symptomatic vaginal atrophy looks like she did not come back in for a follow-up.  She does find it helps but it is very messy and she often forgets I will try changing her to Vagifem   - Grade 1/2 uterine prolapse/grade 2 rectocele: Expectantly managing  Health maintenance: She is overdue for mammogram so order was placed  - Follow-up: Will plan for an annual in February and see how she is doing with her Vagifem      10/13/2021 problem visit, Leslye is a 63 year old menopausal patient, of note I do not see a gravity status on her, she went to the urgent care about a week ago with a vaginal bulge that she noted when she is on the toilet.  It quickly went away and she has no significant pain but is worried about what could be.  Her bowel movements are daily and not hard but she does require some mild splinting.  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  I will initiate Estrace  cream since she also has significant atrophy and complains of dryness and bring her back in 3 months to further assess.  I recommend in the meantime some healthy weight loss.  She had a normal colonoscopy last 3 years.  Not sexually active with her husband but does masturbate.  I will see need right now for any surgical intervention and we will plan to expectantly manage unless the symptoms are to significantly bother her denies bleeding.           01/14/21 new patient annual, this is a 63 year old menopausal patient  without complaints and here for preventative visit. She denies any  bleeding. She is rarely sexually active with her husband about twice a  year. She has some moderate atrophy noted on exam and I  recommend over-the-counter Vagisil as needed for any irritation.  She's up with her mammogram. Up-to-date with colon screening. SBE  recommended monthly and Pap smear performed.

## 2024-03-23 LAB — PAP IG, APTIMA HPV AND RFX 16/18,45 (199305)
.: 0
HPV Aptima: NEGATIVE

## 2024-03-28 ENCOUNTER — Ambulatory Visit
Admit: 2024-03-28 | Discharge: 2024-03-28 | Payer: BLUE CROSS/BLUE SHIELD | Attending: Family Medicine | Primary: Family Medicine

## 2024-03-28 VITALS — BP 106/78 | HR 89 | Temp 98.20000°F | Resp 18 | Ht 65.0 in | Wt 247.7 lb

## 2024-03-28 DIAGNOSIS — E785 Hyperlipidemia, unspecified: Secondary | ICD-10-CM

## 2024-03-28 MED ORDER — TIRZEPATIDE-WEIGHT MANAGEMENT 2.5 MG/0.5ML SC SOAJ
2.5 | SUBCUTANEOUS | 0 refills | 28.00000 days | Status: DC
Start: 2024-03-28 — End: 2024-07-25

## 2024-03-28 NOTE — Progress Notes (Signed)
 CHIEF COMPLAINT:  Chief Complaint   Patient presents with    Follow-up     Itchy eye- constantly   Zepbound questions and concerns- pt has sleep apnea        HISTORY OF PRESENT ILLNESS:  Betty Dudley is a 63 y.o. female  who presents in follow-up.   Obesity- notes that she has sleep apnea. She would like to consider zepbound for this. She failed contrave , phentermine , and metformin. She already saw obesity medicine.   OSA on CPAP- she sees dr Garner and has OSA on CPAP at night. Would  like to try to use Zepbound to help lose weight.   Borderline HPL- up slightly  Vitamin D- taking back her supplement as level low  Prediabetes- stable on recent labs.   Depression- knows she is depressed about things she cannot control. She does not feel like killing herself.   She notes she has itchy eyes constantly. She feels she got poison ivy on her eyes but that is healed. Now her eyes itch constantly. She has not seen the eye doctor.     Mammogram UTD  Pap smear UTD  Colon Cancer screening- UTD  PHQ:      03/26/2024     2:30 PM   PHQ-9    Little interest or pleasure in doing things 2   Feeling down, depressed, or hopeless 2   Trouble falling or staying asleep, or sleeping too much 2   Feeling tired or having little energy 2   Poor appetite or overeating 0   Feeling bad about yourself - or that you are a failure or have let yourself or your family down 2   Trouble concentrating on things, such as reading the newspaper or watching television 2   Moving or speaking so slowly that other people could have noticed. Or the opposite - being so fidgety or restless that you have been moving around a lot more than usual 0   Thoughts that you would be better off dead, or of hurting yourself in some way 0   PHQ-2 Score 4    PHQ-9 Total Score 12    If you checked off any problems, how difficult have these problems made it for you to do your work, take care of things at home, or get along with other people? 1       Patient-reported        CURRENT MEDICATION LIST:    Current Outpatient Medications   Medication Sig Dispense Refill    tirzepatide-weight management (ZEPBOUND) 2.5 MG/0.5ML SOAJ subCUTAneous auto-injector pen Inject 2.5 mg into the skin every 7 days 2 mL 0    AIRSUPRA 90-80 MCG/ACT AERO INHALE 2 PUFFS BY MOUTH EVERY 3 TO 4 HOURS FOR COUGH OR WHEEZING OR SHORTNESS OF BREATH      Estradiol  (VAGIFEM ) 10 MCG TABS vaginal tablet Place 1 tablet vaginally Twice a Week 24 tablet 3    clobetasol  (TEMOVATE ) 0.05 % ointment Apply topically tid for 7days, then bid for 7days, then qd for 7 days and then discontinue 45 g 3    diclofenac  sodium (VOLTAREN ) 1 % GEL Apply topically 4 times daily as needed for Pain      ibuprofen (ADVIL;MOTRIN) 200 MG tablet Take 1 tablet by mouth every 6 hours as needed for Pain Patient does not know dose, she takes 2 tablets a day.      Azelastine HCl 137 MCG/SPRAY SOLN       EPINEPHrine (EPIPEN) 0.3 MG/0.3ML  SOAJ injection       fluticasone  (FLONASE ) 50 MCG/ACT nasal spray 1 spray by Each Nostril route daily 32 g 0    Cetirizine HCl (ZYRTEC ALLERGY) 10 MG CAPS 1 tablet Orally Once a day for 30 day(s)      diphenhydrAMINE HCl (BENADRYL ALLERGY PO) PRN      budesonide-formoterol (SYMBICORT) 160-4.5 MCG/ACT AERO 2 puffs Inhalation Twice a day      albuterol sulfate HFA (PROVENTIL;VENTOLIN;PROAIR) 108 (90 Base) MCG/ACT inhaler 1 puff as needed Inhalation every 4 hrs (Patient not taking: Reported on 03/28/2024)       No current facility-administered medications for this visit.        ALLERGIES:    Allergies   Allergen Reactions    Penicillins Anaphylaxis    Diclofenac  Diarrhea        HISTORY:  Past Medical History:   Diagnosis Date    Abdominal pain     Asthma     Atelectasis     Colitis     Congenital pectus excavatum     Depression     Fatty liver     High cholesterol     Osteoarthritis     Pelvic pain in female     Sleep apnea     Vaginal atrophy     Venous insufficiency       Past Surgical History:   Procedure  Laterality Date    FOOT SURGERY  1978    Cyst remove right foot    LIPOMA RESECTION      TONSILLECTOMY  1980    TUBAL LIGATION        Social History     Socioeconomic History    Marital status: Married     Spouse name: Not on file    Number of children: Not on file    Years of education: Not on file    Highest education level: Not on file   Occupational History    Not on file   Tobacco Use    Smoking status: Former     Current packs/day: 0.00     Average packs/day: 1 pack/day for 20.0 years (20.0 ttl pk-yrs)     Types: Cigarettes     Start date: 10/05/1986     Quit date: 10/05/2006     Years since quitting: 17.4    Smokeless tobacco: Never    Tobacco comments:     Quit >15 years ago.   Vaping Use    Vaping status: Never Used   Substance and Sexual Activity    Alcohol use: Yes     Alcohol/week: 2.0 standard drinks of alcohol     Types: 1 Glasses of wine, 1 Cans of beer per week     Comment: This is a guess i do not drink every day but occasionally i    Drug use: Never    Sexual activity: Not Currently   Other Topics Concern    Not on file   Social History Narrative    Not on file     Social Drivers of Health     Financial Resource Strain: Not on file   Food Insecurity: Not on file   Transportation Needs: Not on file   Physical Activity: Not on file   Stress: Not on file   Social Connections: Not on file   Intimate Partner Violence: Not on file   Housing Stability: Not on file      Family History   Problem Relation Age  of Onset    Alzheimer's Disease Mother     Dementia Father     Breast Cancer Paternal Aunt         Review of Systems   Constitutional: Negative.    HENT: Negative.     Eyes: Negative.    Respiratory:  Positive for shortness of breath.    Cardiovascular: Negative.    Gastrointestinal: Negative.    Endocrine: Negative.    Genitourinary: Negative.    Musculoskeletal: Negative.    Skin: Negative.    Allergic/Immunologic: Negative.    Neurological: Negative.    Hematological: Negative.    Psychiatric/Behavioral:  Negative.          PHYSICAL EXAM:  Vital Signs -   Visit Vitals  BP 106/78   Pulse 89   Temp 98.2 F (36.8 C) (Oral)   Resp 18   Ht 1.651 m (5' 5)   Wt 112.4 kg (247 lb 11.2 oz)   SpO2 93%   BMI 41.22 kg/m        Physical Exam  Vitals and nursing note reviewed.   Constitutional:       General: She is not in acute distress.     Appearance: Normal appearance. She is obese.   HENT:      Head: Normocephalic and atraumatic.      Mouth/Throat:      Mouth: Mucous membranes are moist.      Pharynx: Oropharynx is clear. No oropharyngeal exudate or posterior oropharyngeal erythema.   Eyes:      Extraocular Movements: Extraocular movements intact.      Conjunctiva/sclera: Conjunctivae normal.      Pupils: Pupils are equal, round, and reactive to light.   Neck:      Vascular: No carotid bruit.   Cardiovascular:      Rate and Rhythm: Normal rate and regular rhythm.      Heart sounds: Normal heart sounds. No murmur heard.  Pulmonary:      Effort: Pulmonary effort is normal. No respiratory distress.      Breath sounds: Normal breath sounds.   Abdominal:      General: Bowel sounds are normal. There is no distension.      Palpations: Abdomen is soft.      Tenderness: There is no abdominal tenderness.   Musculoskeletal:         General: No swelling or tenderness. Normal range of motion.      Cervical back: Normal range of motion and neck supple.      Right lower leg: No edema (trace).      Left lower leg: No edema (trace).   Lymphadenopathy:      Cervical: No cervical adenopathy.   Skin:     General: Skin is warm and dry.      Findings: No erythema or rash.   Neurological:      General: No focal deficit present.      Mental Status: She is alert and oriented to person, place, and time. Mental status is at baseline.   Psychiatric:         Mood and Affect: Mood normal.         Behavior: Behavior normal.         Thought Content: Thought content normal.         Judgment: Judgment normal.          LABS  No results found for this visit  on 03/28/24.  Office Visit on 03/21/2024   Component  Date Value Ref Range Status    Diagnosis: 03/21/2024 Comment   Final    NEGATIVE FOR INTRAEPITHELIAL LESION OR MALIGNANCY.    Specimen adequacy: 03/21/2024 Comment   Final    Comment: Satisfactory for evaluation. Endocervical and/or squamous metaplastic  cells (endocervical component) are present.      Clinician Provided ICD 03/21/2024 Comment   Final    Comment: Z12.4  Z11.51      Performed by: 03/21/2024 Comment   Final    Hildegard Leaven, Cytologist (ASCP)    . 03/21/2024 .   Final    Note: 03/21/2024 Comment   Final    Comment: The Pap smear is a screening test designed to aid in the detection of  premalignant and malignant conditions of the uterine cervix.  It is not a  diagnostic procedure and should not be used as the sole means of detecting  cervical cancer.  Both false-positive and false-negative reports do occur.      Methodology: 03/21/2024 Comment   Final    Comment: This liquid based ThinPrep(R) pap test was interpreted using the  Hologic(R) Genius(TM) Cervical Algorithm whole slide imaging system.      HPV Aptima 03/21/2024 Negative  Negative Final    Comment: This nucleic acid amplification test detects fourteen high-risk  HPV types (16,18,31,33,35,39,45,51,52,56,58,59,66,68) without  differentiation.      HPV Genotype Reflex 03/21/2024 Comment   Final    Criteria not met, HPV Genotype not performed.       IMPRESSION/PLAN    1. Borderline hyperlipidemia  Worsened, cont to monitor with diet and exercise and hopeful trial of GLP-1  - tirzepatide-weight management (ZEPBOUND) 2.5 MG/0.5ML SOAJ subCUTAneous auto-injector pen; Inject 2.5 mg into the skin every 7 days  Dispense: 2 mL; Refill: 0  - Cbc With Auto Differential; Future    2. Prediabetes  Stable currently, cont to monitor with diet and exercise and hopeful trial of GLP-1  - tirzepatide-weight management (ZEPBOUND) 2.5 MG/0.5ML SOAJ subCUTAneous auto-injector pen; Inject 2.5 mg into the skin every 7  days  Dispense: 2 mL; Refill: 0  - Cbc With Auto Differential; Future    3. OSA on CPAP  Will trial zepbound as discussed   - tirzepatide-weight management (ZEPBOUND) 2.5 MG/0.5ML SOAJ subCUTAneous auto-injector pen; Inject 2.5 mg into the skin every 7 days  Dispense: 2 mL; Refill: 0    4. Morbid obesity with BMI of 40.0-44.9, adult (HCC)  Will trial zepbound as has failed dietary changes, phentermine , metformin and contrave . She also has OSA on CPAP and prediabetes and borderline HPL that is worsened. I do feel that weight loss will help with all of these health issues.   Discussed potential adverse effects   - tirzepatide-weight management (ZEPBOUND) 2.5 MG/0.5ML SOAJ subCUTAneous auto-injector pen; Inject 2.5 mg into the skin every 7 days  Dispense: 2 mL; Refill: 0    5. Mild recurrent major depression  Does not want medication, feels she worries about things that she cannot control.      6. Vitamin D Deficiency  Noted she is now taking vitamin D supplementation    7. Itchy eyes  Will have evaluation with Optho first but may need autoimmune evaluation  - Sebastian DOUGLAS Norleen Gordy - Ophthalmology     Follow up and Dispositions:  Return in about 4 months (around 07/28/2024).

## 2024-04-01 LAB — CBC WITH AUTO DIFFERENTIAL
Basophils %: 1 %
Basophils Absolute: 0.1 10*3/uL (ref 0.0–0.2)
Eosinophils %: 6 %
Eosinophils Absolute: 0.5 10*3/uL — ABNORMAL HIGH (ref 0.0–0.4)
Hematocrit: 39.8 % (ref 34.0–46.6)
Hemoglobin: 13.1 g/dL (ref 11.1–15.9)
Immature Grans (Abs): 0 10*3/uL (ref 0.0–0.1)
Immature Granulocytes %: 0 %
Lymphocytes %: 35 %
Lymphocytes Absolute: 2.6 10*3/uL (ref 0.7–3.1)
MCH: 31.5 pg (ref 26.6–33.0)
MCHC: 32.9 g/dL (ref 31.5–35.7)
MCV: 96 fL (ref 79–97)
Monocytes %: 7 %
Monocytes Absolute: 0.5 10*3/uL (ref 0.1–0.9)
Neutrophils %: 51 %
Neutrophils Absolute: 3.8 10*3/uL (ref 1.4–7.0)
Platelets: 257 10*3/uL (ref 150–450)
RBC: 4.16 x10E6/uL (ref 3.77–5.28)
RDW: 12.4 % (ref 11.7–15.4)
WBC: 7.4 10*3/uL (ref 3.4–10.8)

## 2024-05-12 ENCOUNTER — Ambulatory Visit: Admit: 2024-05-12 | Discharge: 2024-05-12 | Payer: BLUE CROSS/BLUE SHIELD | Primary: Family Medicine

## 2024-05-27 ENCOUNTER — Encounter

## 2024-05-29 ENCOUNTER — Encounter

## 2024-06-13 ENCOUNTER — Ambulatory Visit: Payer: BLUE CROSS/BLUE SHIELD | Primary: Family Medicine

## 2024-07-05 ENCOUNTER — Other Ambulatory Visit: Payer: BLUE CROSS/BLUE SHIELD | Primary: Family Medicine

## 2024-07-25 ENCOUNTER — Encounter

## 2024-07-25 ENCOUNTER — Ambulatory Visit
Admit: 2024-07-25 | Discharge: 2024-07-25 | Payer: BLUE CROSS/BLUE SHIELD | Attending: Family Medicine | Primary: Family Medicine

## 2024-07-25 VITALS — BP 118/70 | HR 91 | Temp 97.70000°F | Resp 16 | Wt 254.4 lb

## 2024-07-25 DIAGNOSIS — R7303 Prediabetes: Principal | ICD-10-CM

## 2024-07-25 MED ORDER — TIRZEPATIDE-WEIGHT MANAGEMENT 2.5 MG/0.5ML SC SOAJ
2.5 | SUBCUTANEOUS | 0 refills | Status: AC
Start: 2024-07-25 — End: ?

## 2024-07-25 MED ORDER — SEMAGLUTIDE-WEIGHT MANAGEMENT 0.25 MG/0.5ML SC SOAJ
0.25 | SUBCUTANEOUS | 1 refills | Status: AC
Start: 2024-07-25 — End: ?

## 2024-07-25 NOTE — Progress Notes (Signed)
 "CHIEF COMPLAINT:  Chief Complaint   Patient presents with    Follow-up     Pt still wanting to discuss weight loss. Pt wants to discuss right hip pain, feels like a pulled muscle.        HISTORY OF PRESENT ILLNESS:  Betty Dudley is a 63 y.o. female  who presents in followup.   Prediabetes- Noted needs recheck- has been stable with diet  Obesity- noted she has gained back weight 20 lbs this year. Still would like to try Zepbound  as failed contrave , phentermine  and metformin while seeing Bariatric with calorie restricted diet and seeing dietician as well. Would benefit greatly from Glp-1. Does have h/o OSA on CPAP= sees Pulm Dr Garner.   Asthma- stable on inhaler.   Vit D Deficiency- noted needs Vitamin D rechecked as takes only sometimes.   HPL- recheck pending.   Asthma- stable on inhaler  Right Hip pain- noted this started a few weeks ago, noted [place] 1 [place] and now is feeling better.     She is going to have cataract surgery upcoming.   Pap Smear- UTD  PHQ:      07/24/2024    12:43 PM   PHQ-9    Little interest or pleasure in doing things 2   Feeling down, depressed, or hopeless 2   Trouble falling or staying asleep, or sleeping too much 3   Feeling tired or having little energy 3   Poor appetite or overeating 3   Feeling bad about yourself - or that you are a failure or have let yourself or your family down 2   Trouble concentrating on things, such as reading the newspaper or watching television 1   Moving or speaking so slowly that other people could have noticed. Or the opposite - being so fidgety or restless that you have been moving around a lot more than usual 0   Thoughts that you would be better off dead, or of hurting yourself in some way 0   PHQ-2 Score 4    PHQ-9 Total Score 16    If you checked off any problems, how difficult have these problems made it for you to do your work, take care of things at home, or get along with other people? 1       Patient-reported        CURRENT MEDICATION LIST:    Current Outpatient Medications   Medication Sig Dispense Refill    tirzepatide -weight management (ZEPBOUND ) 2.5 MG/0.5ML SOAJ subCUTAneous auto-injector pen Inject 2.5 mg into the skin every 7 days 2 mL 0    Semaglutide -Weight Management (WEGOVY ) 0.25 MG/0.5ML SOAJ SC injection Inject 0.25 mg into the skin every 7 days 2 mL 1    AIRSUPRA 90-80 MCG/ACT AERO INHALE 2 PUFFS BY MOUTH EVERY 3 TO 4 HOURS FOR COUGH OR WHEEZING OR SHORTNESS OF BREATH      Estradiol  (VAGIFEM ) 10 MCG TABS vaginal tablet Place 1 tablet vaginally Twice a Week 24 tablet 3    clobetasol  (TEMOVATE ) 0.05 % ointment Apply topically tid for 7days, then bid for 7days, then qd for 7 days and then discontinue 45 g 3    diclofenac  sodium (VOLTAREN ) 1 % GEL Apply topically 4 times daily as needed for Pain      ibuprofen (ADVIL;MOTRIN) 200 MG tablet Take 1 tablet by mouth every 6 hours as needed for Pain Patient does not know dose, she takes 2 tablets a day.  Azelastine HCl 137 MCG/SPRAY SOLN       EPINEPHrine (EPIPEN) 0.3 MG/0.3ML SOAJ injection       fluticasone  (FLONASE ) 50 MCG/ACT nasal spray 1 spray by Each Nostril route daily 32 g 0    Cetirizine HCl (ZYRTEC ALLERGY) 10 MG CAPS 1 tablet Orally Once a day for 30 day(s)      diphenhydrAMINE HCl (BENADRYL ALLERGY PO) PRN      albuterol sulfate HFA (PROVENTIL;VENTOLIN;PROAIR) 108 (90 Base) MCG/ACT inhaler 1 puff as needed Inhalation every 4 hrs (Patient not taking: Reported on 03/28/2024)      budesonide-formoterol (SYMBICORT) 160-4.5 MCG/ACT AERO 2 puffs Inhalation Twice a day       No current facility-administered medications for this visit.        ALLERGIES:    Allergies   Allergen Reactions    Penicillins Anaphylaxis    Diclofenac  Diarrhea        HISTORY:  Past Medical History:   Diagnosis Date    Abdominal pain     Asthma     Atelectasis     Colitis     Congenital pectus excavatum     Depression     Fatty liver     High cholesterol     Osteoarthritis     Pelvic  pain in female     Sleep apnea     Vaginal atrophy     Venous insufficiency       Past Surgical History:   Procedure Laterality Date    FOOT SURGERY  1978    Cyst remove right foot    LIPOMA RESECTION      TONSILLECTOMY  1980    TUBAL LIGATION        Social History     Socioeconomic History    Marital status: Married     Spouse name: Not on file    Number of children: Not on file    Years of education: Not on file    Highest education level: Not on file   Occupational History    Not on file   Tobacco Use    Smoking status: Former     Current packs/day: 0.00     Average packs/day: 1 pack/day for 20.0 years (20.0 ttl pk-yrs)     Types: Cigarettes     Start date: 10/05/1986     Quit date: 10/05/2006     Years since quitting: 17.8    Smokeless tobacco: Never    Tobacco comments:     Quit >15 years ago.   Vaping Use    Vaping status: Never Used   Substance and Sexual Activity    Alcohol use: Yes     Alcohol/week: 2.0 standard drinks of alcohol     Types: 1 Glasses of wine, 1 Cans of beer per week     Comment: This is a guess i do not drink every day but occasionally i    Drug use: Never    Sexual activity: Not Currently   Other Topics Concern    Not on file   Social History Narrative    Not on file     Social Drivers of Health     Financial Resource Strain: Not on file   Food Insecurity: Not on file   Transportation Needs: Not on file   Physical Activity: Not on file   Stress: Not on file   Social Connections: Not on file   Intimate Partner Violence: Not on file   Housing Stability:  Not on file      Family History   Problem Relation Age of Onset    Alzheimer's Disease Mother     Dementia Father     Breast Cancer Paternal Aunt         Review of Systems   Constitutional: Negative.    HENT: Negative.     Eyes: Negative.    Respiratory: Negative.     Cardiovascular: Negative.    Gastrointestinal: Negative.    Endocrine: Negative.    Genitourinary: Negative.    Musculoskeletal: Negative.    Skin: Negative.     Allergic/Immunologic: Negative.    Neurological: Negative.    Hematological: Negative.    Psychiatric/Behavioral: Negative.          PHYSICAL EXAM:  Vital Signs -   Visit Vitals  BP 118/70   Pulse 91   Temp 97.7 F (36.5 C)   Resp 16   Wt 115.4 kg (254 lb 6.4 oz)   SpO2 96%   BMI 42.33 kg/m        Physical Exam  Vitals and nursing note reviewed.   Constitutional:       General: She is not in acute distress.     Appearance: Normal appearance. She is obese.   HENT:      Head: Normocephalic and atraumatic.      Mouth/Throat:      Mouth: Mucous membranes are moist.   Eyes:      Extraocular Movements: Extraocular movements intact.      Conjunctiva/sclera: Conjunctivae normal.      Pupils: Pupils are equal, round, and reactive to light.   Neck:      Vascular: No carotid bruit.   Cardiovascular:      Rate and Rhythm: Normal rate and regular rhythm.      Heart sounds: Normal heart sounds. No murmur heard.  Pulmonary:      Effort: Pulmonary effort is normal. No respiratory distress.      Breath sounds: Normal breath sounds.   Abdominal:      General: Bowel sounds are normal. There is no distension.      Palpations: Abdomen is soft.      Tenderness: There is no abdominal tenderness.   Musculoskeletal:         General: No swelling or tenderness. Normal range of motion.      Cervical back: Normal range of motion and neck supple.      Right lower leg: No edema (trace).      Left lower leg: No edema (trace).   Lymphadenopathy:      Cervical: No cervical adenopathy.   Skin:     General: Skin is warm and dry.      Findings: No erythema or rash.   Neurological:      General: No focal deficit present.      Mental Status: She is alert and oriented to person, place, and time. Mental status is at baseline.   Psychiatric:         Mood and Affect: Mood normal.         Behavior: Behavior normal.         Thought Content: Thought content normal.         Judgment: Judgment normal.          LABS  No results found for this visit on  07/25/24.  Orders Only on 03/31/2024   Component Date Value Ref Range Status    WBC 03/31/2024 7.4  3.4 -  10.8 x10E3/uL Final    RBC 03/31/2024 4.16  3.77 - 5.28 x10E6/uL Final    Hemoglobin 03/31/2024 13.1  11.1 - 15.9 g/dL Final    Hematocrit 93/72/7974 39.8  34.0 - 46.6 % Final    MCV 03/31/2024 96  79 - 97 fL Final    MCH 03/31/2024 31.5  26.6 - 33.0 pg Final    MCHC 03/31/2024 32.9  31.5 - 35.7 g/dL Final    RDW 93/72/7974 12.4  11.7 - 15.4 % Final    Platelets 03/31/2024 257  150 - 450 x10E3/uL Final    Neutrophils % 03/31/2024 51  Not Estab. % Final    Lymphocytes % 03/31/2024 35  Not Estab. % Final    Monocytes % 03/31/2024 7  Not Estab. % Final    Eosinophils % 03/31/2024 6  Not Estab. % Final    Basophils % 03/31/2024 1  Not Estab. % Final    Neutrophils Absolute 03/31/2024 3.8  1.4 - 7.0 x10E3/uL Final    Lymphocytes Absolute 03/31/2024 2.6  0.7 - 3.1 x10E3/uL Final    Monocytes Absolute 03/31/2024 0.5  0.1 - 0.9 x10E3/uL Final    Eosinophils Absolute 03/31/2024 0.5 (H)  0.0 - 0.4 x10E3/uL Final    Basophils Absolute 03/31/2024 0.1  0.0 - 0.2 x10E3/uL Final    Immature Granulocytes % 03/31/2024 0  Not Estab. % Final    Immature Grans (Abs) 03/31/2024 0.0  0.0 - 0.1 x10E3/uL Final       IMPRESSION/PLAN    1. Prediabetes  Noted has been stable but gaining weight, needs to check with insurance about either GLP-1 and coverage with insurance. Pt has failed contrave , phentermine , metformin and has been on a bariatric program with calorie restricted diet and seeing dietician  - tirzepatide -weight management (ZEPBOUND ) 2.5 MG/0.5ML SOAJ subCUTAneous auto-injector pen; Inject 2.5 mg into the skin every 7 days  Dispense: 2 mL; Refill: 0  - CBC with Auto Differential; Future  - Comprehensive Metabolic Panel; Future  - Hemoglobin A1C; Future  - Vitamin B12; Future  - Semaglutide -Weight Management (WEGOVY ) 0.25 MG/0.5ML SOAJ SC injection; Inject 0.25 mg into the skin every 7 days  Dispense: 2 mL; Refill: 1    2.  Borderline hyperlipidemia  Recheck pending, use of GLP-1 will help with this with weight loss  - tirzepatide -weight management (ZEPBOUND ) 2.5 MG/0.5ML SOAJ subCUTAneous auto-injector pen; Inject 2.5 mg into the skin every 7 days  Dispense: 2 mL; Refill: 0  - Lipid Panel; Future  - TSH reflex to FT4; Future  - Semaglutide -Weight Management (WEGOVY ) 0.25 MG/0.5ML SOAJ SC injection; Inject 0.25 mg into the skin every 7 days  Dispense: 2 mL; Refill: 1    3. Morbid obesity with BMI of 40.0-44.9, adult (HCC)   Worsening, noted complicates all aspects of healthcare. Pt has failed contrave , phentermine , metformin and has been on a bariatric program with calorie restricted diet and seeing dietician  - tirzepatide -weight management (ZEPBOUND ) 2.5 MG/0.5ML SOAJ subCUTAneous auto-injector pen; Inject 2.5 mg into the skin every 7 days  Dispense: 2 mL; Refill: 0  - Vitamin D 25 Hydroxy; Future  - Semaglutide -Weight Management (WEGOVY ) 0.25 MG/0.5ML SOAJ SC injection; Inject 0.25 mg into the skin every 7 days  Dispense: 2 mL; Refill: 1    4. OSA on CPAP  Stable, seeing Dr Garner  - tirzepatide -weight management (ZEPBOUND ) 2.5 MG/0.5ML SOAJ subCUTAneous auto-injector pen; Inject 2.5 mg into the skin every 7 days  Dispense: 2 mL; Refill: 0  -  Semaglutide -Weight Management (WEGOVY ) 0.25 MG/0.5ML SOAJ SC injection; Inject 0.25 mg into the skin every 7 days  Dispense: 2 mL; Refill: 1    5. Vitamin D deficiency  Recheck, as may need supplementation    6. Mild intermittent asthma, unspecified whether complicated  Stable on inhaler    7. Right hip pain  Will get xray.  And monitor. Trace edema in ankles but no calf ttp or homans sign    Follow up and Dispositions:  Return in about 4 months (around 11/25/2024).       Donnivan Villena Elizabeth Mehki Klumpp, MD    "

## 2024-07-26 ENCOUNTER — Ambulatory Visit: Admit: 2024-07-26 | Discharge: 2024-07-26 | Payer: BLUE CROSS/BLUE SHIELD | Primary: Family Medicine

## 2024-07-26 ENCOUNTER — Encounter

## 2024-07-26 DIAGNOSIS — M25551 Pain in right hip: Principal | ICD-10-CM

## 2024-07-26 LAB — CBC WITH AUTO DIFFERENTIAL
Basophils Absolute: 0.1 x10e3/mcL (ref 0.0–0.2)
Immature Granulocytes %: 0.3 % (ref 0.0–0.6)
Lymphocytes: 25.9 % (ref 15.0–45.0)
MPV: 10.7 fL (ref 7.0–12.2)
Monocytes %: 5.7 % (ref 4.0–12.0)
NRBC Automated: 0 % (ref 0.0–0.2)
Neutrophils %: 62.9 % (ref 42.0–74.0)
RDW: 12 % (ref 10.0–17.0)

## 2024-07-26 LAB — COMPREHENSIVE METABOLIC PANEL
ALT: 22 U/L (ref 0–42)
BUN: 18 mg/dL (ref 8–23)
CO2: 27 mmol/L (ref 22–29)
Chloride: 104 mmol/L (ref 98–107)

## 2024-07-26 LAB — LIPID PANEL
Chol/HDL Ratio: 3.7 (ref 0.0–4.4)
Cholesterol, Total: 221 mg/dL — ABNORMAL HIGH (ref 100–200)
HDL: 59 mg/dL (ref >=50)
LDL Cholesterol: 122.4 mg/dL — ABNORMAL HIGH (ref 0.0–100.0)
LDL/HDL Ratio: 2.1
Triglycerides: 198 mg/dL — ABNORMAL HIGH (ref 0–149)
VLDL: 39.6 mg/dL (ref 5.0–40.0)

## 2024-08-21 ENCOUNTER — Ambulatory Visit
Admit: 2024-08-21 | Discharge: 2024-08-21 | Payer: BLUE CROSS/BLUE SHIELD | Attending: Family Medicine | Primary: Family Medicine

## 2024-08-21 VITALS — BP 118/68 | HR 75 | Temp 98.30000°F | Resp 21 | Ht 65.0 in | Wt 254.0 lb

## 2024-08-21 DIAGNOSIS — J0100 Acute maxillary sinusitis, unspecified: Principal | ICD-10-CM

## 2024-08-21 MED ORDER — PREDNISONE 20 MG PO TABS
20 | ORAL_TABLET | Freq: Every day | ORAL | 0 refills | 7.00000 days | Status: AC
Start: 2024-08-21 — End: 2024-08-26

## 2024-08-21 MED ORDER — DOXYCYCLINE HYCLATE 100 MG PO TABS
100 | ORAL_TABLET | Freq: Two times a day (BID) | ORAL | 0 refills | 7.00000 days | Status: AC
Start: 2024-08-21 — End: 2024-08-28

## 2024-08-21 NOTE — Progress Notes (Signed)
 "Subjective:cough      Patient ID: Betty Dudley     Chief Complaint Cough and Congestion (Since last Monday )     Time Patient seen by Provider:5:01 PM     HPI The patient is a 63 y.o. femalewith cough and sinus pressure over past week using otc meds no nvd no rash no wheezing. No fever.    Review of Systems    Past Medical History:   Diagnosis Date    Abdominal pain     Asthma     Atelectasis     Colitis     Congenital pectus excavatum     Depression     Fatty liver     High cholesterol     Osteoarthritis     Pelvic pain in female     Sleep apnea     Vaginal atrophy     Venous insufficiency         Past Surgical History:   Procedure Laterality Date    FOOT SURGERY  1978    Cyst remove right foot    LIPOMA RESECTION      TONSILLECTOMY  1980    TUBAL LIGATION          Allergies   Allergen Reactions    Penicillins Anaphylaxis    Diclofenac  Diarrhea        Current Outpatient Medications   Medication Sig Dispense Refill    AIRSUPRA 90-80 MCG/ACT AERO INHALE 2 PUFFS BY MOUTH EVERY 3 TO 4 HOURS FOR COUGH OR WHEEZING OR SHORTNESS OF BREATH      Estradiol  (VAGIFEM ) 10 MCG TABS vaginal tablet Place 1 tablet vaginally Twice a Week 24 tablet 3    clobetasol  (TEMOVATE ) 0.05 % ointment Apply topically tid for 7days, then bid for 7days, then qd for 7 days and then discontinue 45 g 3    diclofenac  sodium (VOLTAREN ) 1 % GEL Apply topically 4 times daily as needed for Pain      ibuprofen (ADVIL;MOTRIN) 200 MG tablet Take 1 tablet by mouth every 6 hours as needed for Pain Patient does not know dose, she takes 2 tablets a day.      Azelastine HCl 137 MCG/SPRAY SOLN       EPINEPHrine (EPIPEN) 0.3 MG/0.3ML SOAJ injection       fluticasone  (FLONASE ) 50 MCG/ACT nasal spray 1 spray by Each Nostril route daily 32 g 0    Cetirizine HCl (ZYRTEC ALLERGY) 10 MG CAPS 1 tablet Orally Once a day for 30 day(s)      diphenhydrAMINE HCl (BENADRYL ALLERGY PO) PRN      albuterol sulfate HFA (PROVENTIL;VENTOLIN;PROAIR) 108 (90 Base) MCG/ACT inhaler 1  puff as needed Inhalation every 4 hrs      budesonide-formoterol (SYMBICORT) 160-4.5 MCG/ACT AERO 2 puffs Inhalation Twice a day      tirzepatide -weight management (ZEPBOUND ) 2.5 MG/0.5ML SOAJ subCUTAneous auto-injector pen Inject 2.5 mg into the skin every 7 days (Patient not taking: Reported on 08/21/2024) 2 mL 0    Semaglutide -Weight Management (WEGOVY ) 0.25 MG/0.5ML SOAJ SC injection Inject 0.25 mg into the skin every 7 days (Patient not taking: Reported on 08/21/2024) 2 mL 1     No current facility-administered medications for this visit.        BP 118/68   Pulse 75   Temp 98.3 F (36.8 C)   Resp 21   Ht 1.651 m (5' 5)   Wt 115.2 kg (254 lb)   LMP  (LMP Unknown)  SpO2 98%   BMI 42.27 kg/m       Objective:   Physical Exam  Vitals reviewed.   Constitutional:       General: She is not in acute distress.     Appearance: Normal appearance. She is not ill-appearing, toxic-appearing or diaphoretic.   HENT:      Head: Normocephalic.      Right Ear: Tympanic membrane and ear canal normal.      Left Ear: Tympanic membrane and ear canal normal.      Nose: Nose normal.      Mouth/Throat:      Mouth: Mucous membranes are moist.      Pharynx: No oropharyngeal exudate or posterior oropharyngeal erythema.   Eyes:      General: No scleral icterus.        Right eye: No discharge.         Left eye: No discharge.      Extraocular Movements: Extraocular movements intact.      Conjunctiva/sclera: Conjunctivae normal.      Pupils: Pupils are equal, round, and reactive to light.   Cardiovascular:      Rate and Rhythm: Normal rate and regular rhythm.      Pulses: Normal pulses.      Heart sounds: Normal heart sounds.   Pulmonary:      Effort: Pulmonary effort is normal.      Breath sounds: Normal breath sounds.   Skin:     General: Skin is warm and dry.   Neurological:      General: No focal deficit present.      Mental Status: She is alert.   Psychiatric:         Mood and Affect: Mood normal.           Assessment/Plan:    1. Acute non-recurrent maxillary sinusitis       No results found for any visits on 08/21/24.   Tx with doxy prednisone  mucinex astelin fu as needed worsening symptoms  I have reviewed prior visit notes and lab results pertinent to this visit.    Patient advised for any new, worsening or recurring symptoms to return to the nearest Emergency Department or Urgent Care. Patient advised regarding lab results, side effects of medications, diagnosis, and follow up. Pt verbalized understanding and return precautions discussed.     Betty LOISE Pump, MD  "
